# Patient Record
Sex: Female | Born: 1970 | Race: White | Hispanic: No | Smoking: Former smoker
Health system: Southern US, Community
[De-identification: ages and names within clinical notes are randomized; demographics above are authoritative.]

## PROBLEM LIST (undated history)

## (undated) DIAGNOSIS — Z87442 Personal history of urinary calculi: Secondary | ICD-10-CM

## (undated) DIAGNOSIS — M549 Dorsalgia, unspecified: Secondary | ICD-10-CM

## (undated) DIAGNOSIS — R42 Dizziness and giddiness: Secondary | ICD-10-CM

## (undated) DIAGNOSIS — J189 Pneumonia, unspecified organism: Secondary | ICD-10-CM

## (undated) DIAGNOSIS — K219 Gastro-esophageal reflux disease without esophagitis: Secondary | ICD-10-CM

## (undated) DIAGNOSIS — R519 Headache, unspecified: Secondary | ICD-10-CM

## (undated) DIAGNOSIS — I509 Heart failure, unspecified: Secondary | ICD-10-CM

## (undated) DIAGNOSIS — F419 Anxiety disorder, unspecified: Secondary | ICD-10-CM

## (undated) DIAGNOSIS — R0600 Dyspnea, unspecified: Secondary | ICD-10-CM

## (undated) DIAGNOSIS — F32A Depression, unspecified: Secondary | ICD-10-CM

## (undated) DIAGNOSIS — Z9889 Other specified postprocedural states: Secondary | ICD-10-CM

## (undated) DIAGNOSIS — R0902 Hypoxemia: Secondary | ICD-10-CM

## (undated) DIAGNOSIS — E039 Hypothyroidism, unspecified: Secondary | ICD-10-CM

## (undated) DIAGNOSIS — K8020 Calculus of gallbladder without cholecystitis without obstruction: Secondary | ICD-10-CM

## (undated) DIAGNOSIS — E079 Disorder of thyroid, unspecified: Secondary | ICD-10-CM

## (undated) DIAGNOSIS — J449 Chronic obstructive pulmonary disease, unspecified: Secondary | ICD-10-CM

## (undated) DIAGNOSIS — R112 Nausea with vomiting, unspecified: Secondary | ICD-10-CM

## (undated) DIAGNOSIS — N261 Atrophy of kidney (terminal): Secondary | ICD-10-CM

## (undated) DIAGNOSIS — D649 Anemia, unspecified: Secondary | ICD-10-CM

## (undated) DIAGNOSIS — R51 Headache: Secondary | ICD-10-CM

## (undated) DIAGNOSIS — I10 Essential (primary) hypertension: Secondary | ICD-10-CM

## (undated) DIAGNOSIS — I351 Nonrheumatic aortic (valve) insufficiency: Secondary | ICD-10-CM

## (undated) DIAGNOSIS — F329 Major depressive disorder, single episode, unspecified: Secondary | ICD-10-CM

## (undated) HISTORY — DX: Anxiety disorder, unspecified: F41.9

## (undated) HISTORY — DX: Calculus of gallbladder without cholecystitis without obstruction: K80.20

## (undated) HISTORY — DX: Major depressive disorder, single episode, unspecified: F32.9

## (undated) HISTORY — PX: CHOLECYSTECTOMY: SHX55

## (undated) HISTORY — DX: Disorder of thyroid, unspecified: E07.9

## (undated) HISTORY — PX: NO PAST SURGERIES: SHX2092

## (undated) HISTORY — DX: Atrophy of kidney (terminal): N26.1

## (undated) HISTORY — DX: Nonrheumatic aortic (valve) insufficiency: I35.1

## (undated) HISTORY — DX: Depression, unspecified: F32.A

---
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
## 2005-08-09 ENCOUNTER — Emergency Department: Payer: Self-pay | Admitting: Emergency Medicine

## 2006-03-30 ENCOUNTER — Emergency Department: Payer: Self-pay | Admitting: Emergency Medicine

## 2008-01-12 ENCOUNTER — Emergency Department: Payer: Self-pay | Admitting: Emergency Medicine

## 2008-01-17 ENCOUNTER — Emergency Department: Payer: Self-pay | Admitting: Emergency Medicine

## 2008-06-06 ENCOUNTER — Observation Stay: Payer: Self-pay | Admitting: Obstetrics and Gynecology

## 2008-06-18 ENCOUNTER — Observation Stay: Payer: Self-pay | Admitting: Obstetrics and Gynecology

## 2008-06-27 ENCOUNTER — Observation Stay: Payer: Self-pay | Admitting: Obstetrics and Gynecology

## 2008-06-29 ENCOUNTER — Observation Stay: Payer: Self-pay | Admitting: Obstetrics and Gynecology

## 2008-07-03 ENCOUNTER — Inpatient Hospital Stay: Payer: Self-pay | Admitting: Obstetrics and Gynecology

## 2009-03-21 ENCOUNTER — Emergency Department: Payer: Self-pay | Admitting: Internal Medicine

## 2009-07-14 ENCOUNTER — Emergency Department: Payer: Self-pay | Admitting: Internal Medicine

## 2012-07-21 ENCOUNTER — Emergency Department: Payer: Self-pay | Admitting: Emergency Medicine

## 2012-07-21 LAB — URINALYSIS, COMPLETE
Bacteria: NONE SEEN
Bilirubin,UR: NEGATIVE
Glucose,UR: NEGATIVE mg/dL
Ketone: NEGATIVE
Nitrite: NEGATIVE
Ph: 6
Protein: NEGATIVE
RBC,UR: 3 /HPF
Specific Gravity: 1.014
Squamous Epithelial: 1
WBC UR: 5 /HPF

## 2012-07-21 LAB — BASIC METABOLIC PANEL
Anion Gap: 8 (ref 7–16)
BUN: 8 mg/dL (ref 7–18)
Calcium, Total: 8.8 mg/dL (ref 8.5–10.1)
Chloride: 102 mmol/L (ref 98–107)
EGFR (Non-African Amer.): >60
Glucose: 89 mg/dL (ref 65–99)
Osmolality: 270 (ref 275–301)
Potassium: 3.5 mmol/L (ref 3.5–5.1)
Sodium: 136 mmol/L (ref 136–145)

## 2012-07-21 LAB — PREGNANCY, URINE: Pregnancy Test, Urine: NEGATIVE m[IU]/mL

## 2012-07-21 LAB — CBC
HCT: 37.9 %
HGB: 12.3 g/dL
MCH: 28 pg
MCHC: 32.3 g/dL
MCV: 87 fL
Platelet: 274 x10 3/mm 3
RBC: 4.38 X10 6/mm 3
RDW: 15.3 % — ABNORMAL HIGH
WBC: 5.2 x10 3/mm 3

## 2012-07-21 LAB — RAPID INFLUENZA A&B ANTIGENS

## 2015-05-11 ENCOUNTER — Emergency Department: Payer: Medicaid Other

## 2015-05-11 ENCOUNTER — Encounter: Payer: Self-pay | Admitting: *Deleted

## 2015-05-11 ENCOUNTER — Emergency Department
Admission: EM | Admit: 2015-05-11 | Discharge: 2015-05-11 | Disposition: A | Discharge status: Home / Self Care | Payer: Medicaid Other | Attending: Emergency Medicine | Admitting: Emergency Medicine

## 2015-05-11 DIAGNOSIS — R1030 Lower abdominal pain, unspecified: Secondary | ICD-10-CM | POA: Insufficient documentation

## 2015-05-11 DIAGNOSIS — Z3202 Encounter for pregnancy test, result negative: Secondary | ICD-10-CM | POA: Insufficient documentation

## 2015-05-11 DIAGNOSIS — Z720 Tobacco use: Secondary | ICD-10-CM | POA: Insufficient documentation

## 2015-05-11 DIAGNOSIS — R109 Unspecified abdominal pain: Secondary | ICD-10-CM

## 2015-05-11 LAB — COMPREHENSIVE METABOLIC PANEL
ALK PHOS: 96 U/L (ref 38–126)
ALT: 58 U/L — AB (ref 14–54)
AST: 38 U/L (ref 15–41)
Albumin: 3.5 g/dL (ref 3.5–5.0)
Anion gap: 8 (ref 5–15)
BUN: 16 mg/dL (ref 6–20)
CALCIUM: 8.7 mg/dL — AB (ref 8.9–10.3)
CO2: 23 mmol/L (ref 22–32)
CREATININE: 1.01 mg/dL — AB (ref 0.44–1.00)
Chloride: 105 mmol/L (ref 101–111)
Glucose, Bld: 110 mg/dL — ABNORMAL HIGH (ref 65–99)
Potassium: 3.7 mmol/L (ref 3.5–5.1)
Sodium: 136 mmol/L (ref 135–145)
TOTAL PROTEIN: 6.6 g/dL (ref 6.5–8.1)
Total Bilirubin: 0.9 mg/dL (ref 0.3–1.2)

## 2015-05-11 LAB — CBC WITH DIFFERENTIAL/PLATELET
BASOS ABS: 0.1 10*3/uL (ref 0–0.1)
BASOS PCT: 1 %
EOS ABS: 0.1 10*3/uL (ref 0–0.7)
Eosinophils Relative: 1 %
HCT: 35 % (ref 35.0–47.0)
HEMOGLOBIN: 11 g/dL — AB (ref 12.0–16.0)
Lymphocytes Relative: 16 %
Lymphs Abs: 1.7 10*3/uL (ref 1.0–3.6)
MCH: 26.5 pg (ref 26.0–34.0)
MCHC: 31.4 g/dL — AB (ref 32.0–36.0)
MCV: 84.3 fL (ref 80.0–100.0)
MONOS PCT: 7 %
Monocytes Absolute: 0.7 10*3/uL (ref 0.2–0.9)
NEUTROS PCT: 75 %
Neutro Abs: 8.2 10*3/uL — ABNORMAL HIGH (ref 1.4–6.5)
Platelets: 260 10*3/uL (ref 150–440)
RBC: 4.15 MIL/uL (ref 3.80–5.20)
RDW: 19.2 % — ABNORMAL HIGH (ref 11.5–14.5)
WBC: 10.8 10*3/uL (ref 3.6–11.0)

## 2015-05-11 LAB — URINALYSIS COMPLETE WITH MICROSCOPIC (ARMC ONLY)
Bilirubin Urine: NEGATIVE
Glucose, UA: NEGATIVE mg/dL
Hgb urine dipstick: NEGATIVE
Ketones, ur: NEGATIVE mg/dL
NITRITE: NEGATIVE
PROTEIN: 100 mg/dL — AB
SPECIFIC GRAVITY, URINE: 1.025 (ref 1.005–1.030)
pH: 5 (ref 5.0–8.0)

## 2015-05-11 LAB — POCT PREGNANCY, URINE: Preg Test, Ur: NEGATIVE

## 2015-05-11 LAB — HCG, QUANTITATIVE, PREGNANCY: hCG, Beta Chain, Quant, S: <1 m[IU]/mL (ref <5)

## 2015-05-11 NOTE — ED Notes (Signed)
Pt brought in via triage; pt states she feels like she has been having contractions since last night - no evidence though that pt is pregnant at this time.  Pt states that her last pregnancy, she was unaware that she was pregnant until about 7 months; prior to that, pt had 21 negative pregnancy tests. Pt A/O x4 with pain 6/10 in abdominal/pelvic area.  POC urine preg negative per triage RN.

## 2015-05-11 NOTE — Discharge Instructions (Signed)
As we have discussed your workup does not appear to show any acute abnormalities besides a moderate amount of free fluid in her pelvis on ultrasound. As we discussed please follow-up with OB/GYN as soon as possible regarding her abdominal cramping/contractions. Return to the emergency department for any increased abdominal pain, weakness, fever, or any other symptom personally concerning to yourself.   Abdominal Pain, Adult Many things can cause abdominal pain. Usually, abdominal pain is not caused by a disease and will improve without treatment. It can often be observed and treated at home. Your health care provider will do a physical exam and possibly order blood tests and X-rays to help determine the seriousness of your pain. However, in many cases, more time must pass before a clear cause of the pain can be found. Before that point, your health care provider may not know if you need more testing or further treatment. HOME CARE INSTRUCTIONS Monitor your abdominal pain for any changes. The following actions may help to alleviate any discomfort you are experiencing:  Only take over-the-counter or prescription medicines as directed by your health care provider.  Do not take laxatives unless directed to do so by your health care provider.  Try a clear liquid diet (broth, tea, or water) as directed by your health care provider. Slowly move to a bland diet as tolerated. SEEK MEDICAL CARE IF:  You have unexplained abdominal pain.  You have abdominal pain associated with nausea or diarrhea.  You have pain when you urinate or have a bowel movement.  You experience abdominal pain that wakes you in the night.  You have abdominal pain that is worsened or improved by eating food.  You have abdominal pain that is worsened with eating fatty foods.  You have a fever. SEEK IMMEDIATE MEDICAL CARE IF:  Your pain does not go away within 2 hours.  You keep throwing up (vomiting).  Your pain is felt  only in portions of the abdomen, such as the right side or the left lower portion of the abdomen.  You pass bloody or black tarry stools. MAKE SURE YOU:  Understand these instructions.  Will watch your condition.  Will get help right away if you are not doing well or get worse.   This information is not intended to replace advice given to you by your health care provider. Make sure you discuss any questions you have with your health care provider.   Document Released: 04/04/2005 Document Revised: 03/16/2015 Document Reviewed: 03/04/2013 Elsevier Interactive Patient Education [employer].

## 2015-05-11 NOTE — ED Provider Notes (Signed)
[facility] Emergency Department Provider Note  Time seen: 9:44 AM  I have reviewed the triage vital signs and the nursing notes.   HISTORY  Chief Complaint Possible Pregnancy    HPI Grace Fox is a 44 y.o. female with no past medical history who presents the emergency department lower cramping/contractions. According to the patient she has had 6 children, and reports negative pregnancy tests with her children. She states she is having lower abdominal cramping which started at 10 PM last night which she states feels a lot like contractions. Patient states she took a home pregnant test and it was negative, but states with her last child and her pregnancy test remained negative throughout her pregnancy. Denies vaginal bleeding or discharge. Her last period was approximately 2-3 weeks ago, but she states appeared to very irregular and she had irregular bleeding throughout her last pregnancy as well. Denies any nausea, vomiting, diarrhea, dysuria, vaginal bleeding or discharge, or fever. Denies abdominal pain besides lower abdominal cramping/contractions. No modifying factors identified.    History reviewed. No pertinent past medical history.  There are no active problems to display for this patient.   History reviewed. No pertinent past surgical history.  No current outpatient prescriptions on file.  Allergies Review of patient's allergies indicates no known allergies.  No family history on file.  Social History Social History  Substance Use Topics  . Smoking status: Current Every Day Smoker -- 1.00 packs/day    Types: Cigarettes  . Smokeless tobacco: None  . Alcohol Use: No    Review of Systems Constitutional: Negative for fever. Cardiovascular: Negative for chest pain. Respiratory: Negative for shortness of breath. Gastrointestinal: Lower abdominal cramping/contractions Genitourinary: Negative for dysuria. Neurological: Negative for  headaches, focal weakness or numbness. 10-point ROS otherwise negative.  ____________________________________________   PHYSICAL EXAM:  VITAL SIGNS: ED Triage Vitals  Enc Vitals Group     BP 05/11/15 0830 158/98 mmHg     Pulse Rate 05/11/15 0830 110     Resp 05/11/15 0830 18     Temp 05/11/15 0830 97.4 F (36.3 C)     Temp Source 05/11/15 0830 Oral     SpO2 05/11/15 0830 100 %     Weight 05/11/15 0830 234 lb 3 oz (106.227 kg)     Height 05/11/15 0830 5\' 6"  (1.676 m)     Head Cir --      Peak Flow --      Pain Score 05/11/15 0844 6     Pain Loc --      Pain Edu? --      Excl. in [place]? --    Constitutional: Alert and oriented. Well appearing and in no distress. Eyes: Normal exam ENT   Head: Normocephalic and atraumatic.   Mouth/Throat: Mucous membranes are moist. Cardiovascular: Normal rate, regular rhythm. No murmur Respiratory: Normal respiratory effort without tachypnea nor retractions. Breath sounds are clear and equal bilaterally. No wheezes/rales/rhonchi. Gastrointestinal: Soft and nontender. No distention.  Obese Musculoskeletal: Nontender with normal range of motion in all extremities. Neurologic:  Normal speech and language. No gross focal neurologic deficits Skin:  Skin is warm, dry and intact.  Psychiatric: Mood and affect are normal. Speech and behavior are normal.   ____________________________________________     RADIOLOGY  Moderate free fluid in the pelvis, otherwise within normal limits  ____________________________________________    INITIAL IMPRESSION / ASSESSMENT AND PLAN / ED COURSE  Pertinent labs & imaging results that were available during my care  of the patient were reviewed by me and considered in my medical decision making (see chart for details).  Patient's main concern is the possibility of pregnancy. States lower abdominal cramping/contractions since 10 PM last night. States the cramping/contractions appear to be coming and  normal intervals. Urine printed test is negative in the emergency department. We will check labs, and a pelvic ultrasound to further evaluate. Bedside ultrasound shows no identifiable fetus. Patient has a nontender exam currently.  Moderate free fluid in the pelvis on ultrasound, otherwise within normal limits. Patient has a nontender exam. States her discomfort is improved. I discussed with the patient and need for her to follow-up with an OB/GYN symptoms possible given her abdominal cramping and moderate amount of free fluid on ultrasound. Patient is agreeable to plan. We will discharge home at this time. ____________________________________________   FINAL CLINICAL IMPRESSION(S) / ED DIAGNOSES  Abdominal cramping/contractions   Harvest Dark, MD 05/11/15 1223

## 2015-05-11 NOTE — ED Notes (Addendum)
Pt states she is having contractions, last period last month? Pt has had 6 children, pt reports negative pregnancy test, pt reports she had negative pregnancy tests with last child, pt complains of contractions, pt denies vaginal bleeding, RN unable to hear fetal heart tones with doppler, pt denies using birth control

## 2015-05-23 ENCOUNTER — Encounter: Payer: Self-pay | Admitting: Emergency Medicine

## 2015-05-23 ENCOUNTER — Emergency Department
Admission: EM | Admit: 2015-05-23 | Discharge: 2015-05-23 | Disposition: A | Discharge status: Home / Self Care | Payer: Medicaid Other | Attending: Emergency Medicine | Admitting: Emergency Medicine

## 2015-05-23 DIAGNOSIS — F17210 Nicotine dependence, cigarettes, uncomplicated: Secondary | ICD-10-CM | POA: Diagnosis not present

## 2015-05-23 DIAGNOSIS — Y9289 Other specified places as the place of occurrence of the external cause: Secondary | ICD-10-CM | POA: Diagnosis not present

## 2015-05-23 DIAGNOSIS — Y9389 Activity, other specified: Secondary | ICD-10-CM | POA: Diagnosis not present

## 2015-05-23 DIAGNOSIS — R000 Tachycardia, unspecified: Secondary | ICD-10-CM | POA: Diagnosis not present

## 2015-05-23 DIAGNOSIS — T7840XA Allergy, unspecified, initial encounter: Secondary | ICD-10-CM | POA: Diagnosis present

## 2015-05-23 DIAGNOSIS — F419 Anxiety disorder, unspecified: Secondary | ICD-10-CM | POA: Diagnosis not present

## 2015-05-23 DIAGNOSIS — L299 Pruritus, unspecified: Secondary | ICD-10-CM | POA: Diagnosis not present

## 2015-05-23 DIAGNOSIS — R220 Localized swelling, mass and lump, head: Secondary | ICD-10-CM | POA: Diagnosis not present

## 2015-05-23 DIAGNOSIS — Y998 Other external cause status: Secondary | ICD-10-CM | POA: Insufficient documentation

## 2015-05-23 DIAGNOSIS — X58XXXA Exposure to other specified factors, initial encounter: Secondary | ICD-10-CM | POA: Diagnosis not present

## 2015-05-23 MED ORDER — METHYLPREDNISOLONE SODIUM SUCC 125 MG IJ SOLR
125.0000 mg | Freq: Once | INTRAMUSCULAR | Status: AC
Start: 2015-05-23 — End: 2015-05-23
  Administered 2015-05-23: 125 mg via INTRAVENOUS
  Filled 2015-05-23: qty 2

## 2015-05-23 MED ORDER — PREDNISONE 50 MG PO TABS
50.0000 mg | ORAL_TABLET | Freq: Every day | ORAL | Status: DC
Start: 2015-05-23 — End: 2015-05-25

## 2015-05-23 MED ORDER — DIPHENHYDRAMINE HCL 50 MG/ML IJ SOLN
25.0000 mg | Freq: Once | INTRAMUSCULAR | Status: AC
  Administered 2015-05-23: 25 mg via INTRAVENOUS
  Filled 2015-05-23: qty 1

## 2015-05-23 MED ORDER — FAMOTIDINE IN NACL 20-0.9 MG/50ML-% IV SOLN
20.0000 mg | Freq: Once | INTRAVENOUS | Status: AC
  Administered 2015-05-23: 20 mg via INTRAVENOUS
  Filled 2015-05-23: qty 50

## 2015-05-23 NOTE — Discharge Instructions (Signed)

## 2015-05-23 NOTE — ED Provider Notes (Signed)
[facility] Emergency Department Provider Note  ____________________________________________  Time seen: On arrival  I have reviewed the triage vital signs and the nursing notes.   HISTORY  Chief Complaint Oral Swelling and Allergic Reaction    HPI PIERSON HUNNICUTT is a 44 y.o. female who presents with complaints of lip swelling and itching which started approximately 7 hours prior to arrival. She has never had an allergic reaction before. She denies difficulty breathing or throat swelling. Primarily she is worried about swelling of her lips and itching although she has no rash. She is unclear what may have caused the symptoms. No new medications     History reviewed. No pertinent past medical history.  There are no active problems to display for this patient.   History reviewed. No pertinent past surgical history.  No current outpatient prescriptions on file.  Allergies Review of patient's allergies indicates no known allergies.  No family history on file.  Social History Social History  Substance Use Topics  . Smoking status: Current Every Day Smoker -- 1.00 packs/day    Types: Cigarettes  . Smokeless tobacco: None  . Alcohol Use: No    Review of Systems  Constitutional: Negative for fever. Eyes: Negative for visual changes. ENT: Negative for sore throat. Positive for lip swelling Cardiovascular: Negative for chest pain. Respiratory: Negative for shortness of breath. Gastrointestinal: Negative for abdominal pain, vomiting and diarrhea. Genitourinary: Negative for dysuria. Musculoskeletal: Negative for back pain. Skin: Negative for rash. Positive for pruritus Neurological: Negative for headaches or focal weakness Psychiatric: Mild anxiety    ____________________________________________   PHYSICAL EXAM:  VITAL SIGNS: ED Triage Vitals  Enc Vitals Group     BP 05/23/15 0934 151/94 mmHg     Pulse Rate 05/23/15 0934 110   Resp 05/23/15 0934 20     Temp 05/23/15 0934 97.8 F (36.6 C)     Temp Source 05/23/15 0934 Oral     SpO2 05/23/15 0934 97 %     Weight 05/23/15 0934 241 lb 8 oz (109.544 kg)     Height 05/23/15 0934 5\' 6"  (1.676 m)     Head Cir --      Peak Flow --      Pain Score 05/23/15 0950 5     Pain Loc --      Pain Edu? --      Excl. in [place]? --      Constitutional: Alert and oriented. Well appearing and in no distress. Eyes: Conjunctivae are normal.  ENT   Head: Normocephalic and atraumatic.   Mouth/Throat: Mucous membranes are moist. Lower lip is swollen slightly, pharynx is normal, uvula is normal Cardiovascular: Tachycardia, regular rhythm. Normal and symmetric distal pulses are present in all extremities. No murmurs, rubs, or gallops. Respiratory: Normal respiratory effort without tachypnea nor retractions. Breath sounds are clear and equal bilaterally. No wheezing or stridor Gastrointestinal: Soft and non-tender in all quadrants. No distention. There is no CVA tenderness. Genitourinary: deferred Musculoskeletal: Nontender with normal range of motion in all extremities. No lower extremity tenderness nor edema. Neurologic:  Normal speech and language. No gross focal neurologic deficits are appreciated. Skin:  Skin is warm, dry and intact. No rash noted. No hives Psychiatric: Mood and affect are normal. Patient exhibits appropriate insight and judgment.  ____________________________________________    LABS (pertinent positives/negatives)  Labs Reviewed - No data to display  ____________________________________________   EKG  None  ____________________________________________    RADIOLOGY I have personally reviewed any  xrays that were ordered on this patient: None  ____________________________________________   PROCEDURES  Procedure(s) performed: none  Critical Care performed:none  ____________________________________________   INITIAL IMPRESSION /  ASSESSMENT AND PLAN / ED COURSE  Pertinent labs & imaging results that were available during my care of the patient were reviewed by me and considered in my medical decision making (see chart for details).  Patient's presentation consistent with allergic reaction. We will give slight Medrol IV, Pepcid IV, Benadryl IV, and monitor the patient. No airway involvement at this time.  After observation in the emergency department patient's symptoms markedly improved. Her lip swelling is always completely resolved. I will discharge her with by mouth prednisone for the next 5 days. She knows to return immediately if any worsening of her symptoms  ____________________________________________   FINAL CLINICAL IMPRESSION(S) / ED DIAGNOSES  Final diagnoses:  Allergic reaction, initial encounter     Lavonia Drafts, MD 05/23/15 1350

## 2015-05-23 NOTE — ED Notes (Signed)
Patient here complaining of lip swelling starting at 3 AM "it felt like a cold sore to begin with" has been getting progressively worse since then involving both lips.  Complaining of itching all over, "my hands and fingertips are numb".  Denies exposure to known allergens.   Lips possibly swollen, speech is clear. RR 20

## 2015-05-25 ENCOUNTER — Emergency Department
Admission: EM | Admit: 2015-05-25 | Discharge: 2015-05-25 | Disposition: A | Discharge status: Home / Self Care | Payer: Medicaid Other | Attending: Emergency Medicine | Admitting: Emergency Medicine

## 2015-05-25 DIAGNOSIS — X58XXXA Exposure to other specified factors, initial encounter: Secondary | ICD-10-CM | POA: Diagnosis not present

## 2015-05-25 DIAGNOSIS — F17210 Nicotine dependence, cigarettes, uncomplicated: Secondary | ICD-10-CM | POA: Insufficient documentation

## 2015-05-25 DIAGNOSIS — Y998 Other external cause status: Secondary | ICD-10-CM | POA: Insufficient documentation

## 2015-05-25 DIAGNOSIS — T782XXA Anaphylactic shock, unspecified, initial encounter: Secondary | ICD-10-CM | POA: Insufficient documentation

## 2015-05-25 DIAGNOSIS — Z7952 Long term (current) use of systemic steroids: Secondary | ICD-10-CM | POA: Diagnosis not present

## 2015-05-25 DIAGNOSIS — R1110 Vomiting, unspecified: Secondary | ICD-10-CM | POA: Insufficient documentation

## 2015-05-25 DIAGNOSIS — Z792 Long term (current) use of antibiotics: Secondary | ICD-10-CM | POA: Insufficient documentation

## 2015-05-25 DIAGNOSIS — Y9389 Activity, other specified: Secondary | ICD-10-CM | POA: Insufficient documentation

## 2015-05-25 DIAGNOSIS — H05013 Cellulitis of bilateral orbits: Secondary | ICD-10-CM | POA: Insufficient documentation

## 2015-05-25 DIAGNOSIS — Z79899 Other long term (current) drug therapy: Secondary | ICD-10-CM | POA: Insufficient documentation

## 2015-05-25 DIAGNOSIS — T7840XA Allergy, unspecified, initial encounter: Secondary | ICD-10-CM | POA: Diagnosis present

## 2015-05-25 DIAGNOSIS — Y9289 Other specified places as the place of occurrence of the external cause: Secondary | ICD-10-CM | POA: Diagnosis not present

## 2015-05-25 DIAGNOSIS — L03213 Periorbital cellulitis: Secondary | ICD-10-CM

## 2015-05-25 LAB — BASIC METABOLIC PANEL
Anion gap: 8 (ref 5–15)
BUN: 26 mg/dL — AB (ref 6–20)
CALCIUM: 8.3 mg/dL — AB (ref 8.9–10.3)
CO2: 24 mmol/L (ref 22–32)
CREATININE: 1.35 mg/dL — AB (ref 0.44–1.00)
Chloride: 107 mmol/L (ref 101–111)
GFR calc non Af Amer: 48 mL/min — ABNORMAL LOW (ref >60)
GFR, EST AFRICAN AMERICAN: 55 mL/min — AB (ref >60)
Glucose, Bld: 118 mg/dL — ABNORMAL HIGH (ref 65–99)
Potassium: 3.6 mmol/L (ref 3.5–5.1)
SODIUM: 139 mmol/L (ref 135–145)

## 2015-05-25 LAB — CBC WITH DIFFERENTIAL/PLATELET
BASOS PCT: 1 %
Basophils Absolute: 0.1 10*3/uL (ref 0–0.1)
EOS ABS: 0.1 10*3/uL (ref 0–0.7)
EOS PCT: 1 %
HCT: 36 % (ref 35.0–47.0)
Hemoglobin: 11.3 g/dL — ABNORMAL LOW (ref 12.0–16.0)
Lymphocytes Relative: 17 %
Lymphs Abs: 2.2 10*3/uL (ref 1.0–3.6)
MCH: 25.9 pg — AB (ref 26.0–34.0)
MCHC: 31.3 g/dL — AB (ref 32.0–36.0)
MCV: 83 fL (ref 80.0–100.0)
Monocytes Absolute: 0.7 10*3/uL (ref 0.2–0.9)
Monocytes Relative: 5 %
NEUTROS PCT: 76 %
Neutro Abs: 9.8 10*3/uL — ABNORMAL HIGH (ref 1.4–6.5)
PLATELETS: 347 10*3/uL (ref 150–440)
RBC: 4.34 MIL/uL (ref 3.80–5.20)
RDW: 18.4 % — AB (ref 11.5–14.5)
WBC: 12.9 10*3/uL — AB (ref 3.6–11.0)

## 2015-05-25 MED ORDER — ONDANSETRON HCL 4 MG/2ML IJ SOLN
INTRAMUSCULAR | Status: AC
  Administered 2015-05-25: 4 mg via INTRAVENOUS
  Filled 2015-05-25: qty 2

## 2015-05-25 MED ORDER — SULFAMETHOXAZOLE-TRIMETHOPRIM 800-160 MG PO TABS: 1.0000 | ORAL_TABLET | Freq: Two times a day (BID) | ORAL | Status: DC

## 2015-05-25 MED ORDER — DIPHENHYDRAMINE HCL 25 MG PO CAPS: 50.0000 mg | ORAL_CAPSULE | Freq: Four times a day (QID) | ORAL | Status: DC | PRN

## 2015-05-25 MED ORDER — PREDNISONE 20 MG PO TABS
40.0000 mg | ORAL_TABLET | Freq: Once | ORAL | Status: AC
  Administered 2015-05-25: 40 mg via ORAL
  Filled 2015-05-25: qty 2

## 2015-05-25 MED ORDER — ONDANSETRON 8 MG PO TBDP
8.0000 mg | ORAL_TABLET | Freq: Once | ORAL | Status: AC
  Administered 2015-05-25: 8 mg via ORAL
  Filled 2015-05-25: qty 1

## 2015-05-25 MED ORDER — AMOXICILLIN 875 MG PO TABS: 875.0000 mg | ORAL_TABLET | Freq: Two times a day (BID) | ORAL | Status: DC

## 2015-05-25 MED ORDER — DIPHENHYDRAMINE HCL 50 MG/ML IJ SOLN
50.0000 mg | Freq: Once | INTRAMUSCULAR | Status: AC
  Administered 2015-05-25: 50 mg via INTRAVENOUS
  Filled 2015-05-25: qty 1

## 2015-05-25 MED ORDER — FAMOTIDINE IN NACL 20-0.9 MG/50ML-% IV SOLN
20.0000 mg | Freq: Once | INTRAVENOUS | Status: AC
  Administered 2015-05-25: 20 mg via INTRAVENOUS
  Filled 2015-05-25: qty 50

## 2015-05-25 MED ORDER — IPRATROPIUM-ALBUTEROL 0.5-2.5 (3) MG/3ML IN SOLN
3.0000 mL | Freq: Once | RESPIRATORY_TRACT | Status: AC
  Administered 2015-05-25: 3 mL via RESPIRATORY_TRACT
  Filled 2015-05-25: qty 3

## 2015-05-25 MED ORDER — ONDANSETRON HCL 4 MG/2ML IJ SOLN
4.0000 mg | Freq: Once | INTRAMUSCULAR | Status: AC
  Administered 2015-05-25: 4 mg via INTRAVENOUS

## 2015-05-25 MED ORDER — EPINEPHRINE 0.3 MG/0.3ML IJ SOAJ
0.3000 mg | Freq: Once | INTRAMUSCULAR | Status: AC
  Administered 2015-05-25: 0.3 mg via INTRAMUSCULAR
  Filled 2015-05-25: qty 0.3

## 2015-05-25 MED ORDER — METHYLPREDNISOLONE SODIUM SUCC 125 MG IJ SOLR
125.0000 mg | Freq: Once | INTRAMUSCULAR | Status: AC
  Administered 2015-05-25: 125 mg via INTRAVENOUS
  Filled 2015-05-25: qty 2

## 2015-05-25 MED ORDER — PREDNISONE 20 MG PO TABS: 40.0000 mg | ORAL_TABLET | Freq: Every day | ORAL | Status: DC

## 2015-05-25 MED ORDER — SODIUM CHLORIDE 0.9 % IV BOLUS (SEPSIS)
1000.0000 mL | Freq: Once | INTRAVENOUS | Status: AC
  Administered 2015-05-25: 1000 mL via INTRAVENOUS

## 2015-05-25 MED ORDER — EPINEPHRINE 0.3 MG/0.3ML IJ SOAJ: 0.3000 mg | Freq: Once | INTRAMUSCULAR | Status: DC

## 2015-05-25 NOTE — Discharge Instructions (Signed)

## 2015-05-25 NOTE — ED Notes (Signed)
Pt with allergic reaction to unknown substance, was seen for same on Monday and is not any better. Redness and swelling noted to face, eyes and lips.

## 2015-05-25 NOTE — ED Provider Notes (Signed)
[facility] Emergency Department Provider Note  ____________________________________________  Time seen: 8:10 AM  I have reviewed the triage vital signs and the nursing notes.   HISTORY  Chief Complaint Allergic Reaction    HPI Grace Fox is a 44 y.o. female who woke up this morning with swelling of the face and eyes. She feels little bit short of breath. She also has some abdominal cramping nausea and had one episode of vomiting at home. No known exposure to any allergens. No known allergies. She is at her usual state of health when she went to bed last night. She was seen in the emergency department 2 days ago for allergic reaction without any known cause and got better and was sent home. No new fabric or materials in the home, soaps or detergents, no medications.No new pets.     No past medical history on file. Negative  There are no active problems to display for this patient.    No past surgical history on file. Negative  Current Outpatient Rx  Name  Route  Sig  Dispense  Refill  . amoxicillin (AMOXIL) 875 MG tablet   Oral   Take 1 tablet (875 mg total) by mouth 2 (two) times daily.   14 tablet   0   . diphenhydrAMINE (BENADRYL) 25 mg capsule   Oral   Take 2 capsules (50 mg total) by mouth every 6 (six) hours as needed.   60 capsule   0   . EPINEPHrine 0.3 mg/0.3 mL IJ SOAJ injection   Intramuscular   Inject 0.3 mLs (0.3 mg total) into the muscle once. Follow package instructions as needed for severe allergy or anaphylactic reaction.   1 Device   2   . predniSONE (DELTASONE) 20 MG tablet   Oral   Take 2 tablets (40 mg total) by mouth daily.   8 tablet   0   . sulfamethoxazole-trimethoprim (BACTRIM DS) 800-160 MG tablet   Oral   Take 1 tablet by mouth 2 (two) times daily.   14 tablet   0      Allergies Review of patient's allergies indicates no known allergies.   No family history on file.  Social  History Social History  Substance Use Topics  . Smoking status: Current Every Day Smoker -- 1.00 packs/day    Types: Cigarettes  . Smokeless tobacco: Not on file  . Alcohol Use: No    Review of Systems  Constitutional:   No fever or chills. No weight changes Eyes:   No blurry vision or double vision. Swelling of both eyes ENT:   No sore throat. Cardiovascular:   No chest pain. Respiratory:  Mild shortness of breath without cough. Gastrointestinal:   Positive for abdominal pain, with vomiting.  No BRBPR or melena. Genitourinary:   Negative for dysuria, urinary retention, bloody urine, or difficulty urinating. Musculoskeletal:   Negative for back pain. No joint swelling or pain. Skin:   Negative for rash. Neurological:   Negative for headaches, focal weakness or numbness. Psychiatric:  No anxiety or depression.   Endocrine:  No hot/cold intolerance, changes in energy, or sleep difficulty.  10-point ROS otherwise negative.  ____________________________________________   PHYSICAL EXAM:  VITAL SIGNS: ED Triage Vitals  Enc Vitals Group     BP 05/25/15 0803 181/101 mmHg     Pulse Rate 05/25/15 0803 111     Resp 05/25/15 0803 24     Temp 05/25/15 0811 98.1 F (36.7 C)  Temp Source 05/25/15 0811 Oral     SpO2 05/25/15 0803 92 %     Weight --      Height 05/25/15 0803 5\' 6"  (1.676 m)     Head Cir --      Peak Flow --      Pain Score 05/25/15 0803 7     Pain Loc --      Pain Edu? --      Excl. in [place]? --      Constitutional:   Alert and oriented. Well appearing and in no distress. Eyes:   No scleral icterus. No conjunctival pallor. PERRL. painless EOMI. Injected conjunctiva bilaterally, periorbital edema and erythema in the upper and lower eyelids. ENT   Head:   Normocephalic and atraumatic. Urticarial rash on both cheeks   Nose:   No congestion/rhinnorhea. No septal hematoma   Mouth/Throat:   MMM, no pharyngeal erythema. No peritonsillar mass. No uvula  shift.   Neck:   No stridor. No SubQ emphysema. No meningismus. Hematological/Lymphatic/Immunilogical:   No cervical lymphadenopathy. Cardiovascular:   RRR. Normal and symmetric distal pulses are present in all extremities. No murmurs, rubs, or gallops. Respiratory:   Normal respiratory effort without tachypnea nor retractions. Breath sounds are clear and equal bilaterally. No wheezes/rales/rhonchi. Gastrointestinal:   Soft and nontender. No distention. There is no CVA tenderness.  No rebound, rigidity, or guarding. Genitourinary:   deferred Musculoskeletal:   Nontender with normal range of motion in all extremities. No joint effusions.  No lower extremity tenderness.  No edema. Neurologic:   Normal speech and language.  CN 2-10 normal. Motor grossly intact. No pronator drift.  Normal gait. No gross focal neurologic deficits are appreciated.  Skin:    Skin is warm, dry and intact. Urticarial rash bilaterally on the face neck and shoulders, extending down the upper extremities. No rash on the chest back or lower extremities.  No petechiae, purpura, or bullae. Psychiatric:   Mood and affect are normal. Speech and behavior are normal. Patient exhibits appropriate insight and judgment.  ____________________________________________    LABS (pertinent positives/negatives) (all labs ordered are listed, but only abnormal results are displayed) Labs Reviewed  BASIC METABOLIC PANEL - Abnormal; Notable for the following:    Glucose, Bld 118 (*)    BUN 26 (*)    Creatinine, Ser 1.35 (*)    Calcium 8.3 (*)    GFR calc non Af Amer 48 (*)    GFR calc Af Amer 55 (*)    All other components within normal limits  CBC WITH DIFFERENTIAL/PLATELET - Abnormal; Notable for the following:    WBC 12.9 (*)    Hemoglobin 11.3 (*)    MCH 25.9 (*)    MCHC 31.3 (*)    RDW 18.4 (*)    Neutro Abs 9.8 (*)    All other components within normal limits    ____________________________________________   EKG  Interpreted by me Sinus tachycardia rate 108, normal axis intervals QRS and ST segments and T waves.  ____________________________________________    RADIOLOGY    ____________________________________________   PROCEDURES CRITICAL CARE Performed by: Carrie Mew   Total critical care time: 35 minutes  Critical care time was exclusive of separately billable procedures and treating other patients.  Critical care was necessary to treat or prevent imminent or life-threatening deterioration.  Critical care was time spent personally by me on the following activities: development of treatment plan with patient and/or surrogate as well as nursing, discussions with consultants, evaluation  of patient's response to treatment, examination of patient, obtaining history from patient or surrogate, ordering and performing treatments and interventions, ordering and review of laboratory studies, ordering and review of radiographic studies, pulse oximetry and re-evaluation of patient's condition.   ____________________________________________   INITIAL IMPRESSION / ASSESSMENT AND PLAN / ED COURSE  Pertinent labs & imaging results that were available during my care of the patient were reviewed by me and considered in my medical decision making (see chart for details).  Patient presents with symptoms of allergic rectal with multisystem involvement consistent with anaphylaxis. No evidence of shock or hypotension. Patient given Solu-Medrol, Benadryl, famotidine, epinephrine on arrival. Also given a DuoNeb due to her shortness of breath although there is no wheezing.  8:45 AM Vitals remained stable, mildly tachycardic, IV fluids infusing. Patient on 1 L nasal cannula due to occasional oxygen saturation of 89%, but on my evaluation reassessment, patient has oxygen saturation of 95% with good waveform.  12:55 PM Patient continues to feel  better, no worsening or recurrence of symptoms and rash. Persistent periorbital edema and erythema consistent with cellulitis. Low suspicion for orbital cellulitis, no evidence of trauma. No airway involvement or compromise. Her on Bactrim and amoxicillin as well as antihistamine steroids and EpiPen prescription for the allergic reaction. Follow-up with primary care. Oxygen saturation remains 95%, vitals remained stable. Initial tachypnea has resolved, initial tachycardia has resolved. On further history the patient is confident that her reactions are due to either peanut butter or chocolate she has eaten both of these the night before both ED presentations this week. She'll abstain from peanut butter and chocolate and monitor her symptoms.      ____________________________________________   FINAL CLINICAL IMPRESSION(S) / ED DIAGNOSES  Final diagnoses:  Anaphylactic reaction, initial encounter  Periorbital cellulitis, unspecified laterality      Carrie Mew, MD 05/25/15 1258

## 2015-05-25 NOTE — ED Notes (Signed)
Pt's oxygen saturation 89% on room air. Pt placed on 2L via [place].

## 2015-05-26 ENCOUNTER — Encounter: Payer: Self-pay | Admitting: Emergency Medicine

## 2015-05-26 ENCOUNTER — Emergency Department: Payer: Medicaid Other

## 2015-05-26 ENCOUNTER — Observation Stay
Admission: EM | Admit: 2015-05-26 | Discharge: 2015-05-27 | Disposition: A | Discharge status: Home / Self Care | Payer: Medicaid Other | Attending: Internal Medicine | Admitting: Internal Medicine

## 2015-05-26 DIAGNOSIS — L530 Toxic erythema: Principal | ICD-10-CM | POA: Insufficient documentation

## 2015-05-26 DIAGNOSIS — H1133 Conjunctival hemorrhage, bilateral: Secondary | ICD-10-CM | POA: Diagnosis not present

## 2015-05-26 DIAGNOSIS — F17210 Nicotine dependence, cigarettes, uncomplicated: Secondary | ICD-10-CM | POA: Diagnosis not present

## 2015-05-26 DIAGNOSIS — T39315A Adverse effect of propionic acid derivatives, initial encounter: Secondary | ICD-10-CM | POA: Insufficient documentation

## 2015-05-26 DIAGNOSIS — R21 Rash and other nonspecific skin eruption: Secondary | ICD-10-CM | POA: Diagnosis not present

## 2015-05-26 DIAGNOSIS — Z79899 Other long term (current) drug therapy: Secondary | ICD-10-CM | POA: Diagnosis not present

## 2015-05-26 DIAGNOSIS — T7840XD Allergy, unspecified, subsequent encounter: Secondary | ICD-10-CM

## 2015-05-26 DIAGNOSIS — I10 Essential (primary) hypertension: Secondary | ICD-10-CM | POA: Diagnosis not present

## 2015-05-26 DIAGNOSIS — R220 Localized swelling, mass and lump, head: Secondary | ICD-10-CM | POA: Insufficient documentation

## 2015-05-26 DIAGNOSIS — R0602 Shortness of breath: Secondary | ICD-10-CM | POA: Diagnosis not present

## 2015-05-26 DIAGNOSIS — T7840XA Allergy, unspecified, initial encounter: Secondary | ICD-10-CM | POA: Diagnosis present

## 2015-05-26 HISTORY — DX: Headache, unspecified: R51.9

## 2015-05-26 HISTORY — DX: Essential (primary) hypertension: I10

## 2015-05-26 HISTORY — DX: Headache: R51

## 2015-05-26 HISTORY — DX: Pneumonia, unspecified organism: J18.9

## 2015-05-26 LAB — URINALYSIS COMPLETE WITH MICROSCOPIC (ARMC ONLY)
Bilirubin Urine: NEGATIVE
Glucose, UA: NEGATIVE mg/dL
Hgb urine dipstick: NEGATIVE
KETONES UR: NEGATIVE mg/dL
Leukocytes, UA: NEGATIVE
NITRITE: NEGATIVE
PROTEIN: 100 mg/dL — AB
Specific Gravity, Urine: 1.026 (ref 1.005–1.030)
pH: 6 (ref 5.0–8.0)

## 2015-05-26 LAB — COMPREHENSIVE METABOLIC PANEL
ALK PHOS: 75 U/L (ref 38–126)
ALT: 26 U/L (ref 14–54)
AST: 22 U/L (ref 15–41)
Albumin: 3.4 g/dL — ABNORMAL LOW (ref 3.5–5.0)
Anion gap: 9 (ref 5–15)
BUN: 29 mg/dL — ABNORMAL HIGH (ref 6–20)
CALCIUM: 8.7 mg/dL — AB (ref 8.9–10.3)
CO2: 21 mmol/L — AB (ref 22–32)
CREATININE: 1.33 mg/dL — AB (ref 0.44–1.00)
Chloride: 107 mmol/L (ref 101–111)
GFR, EST AFRICAN AMERICAN: 56 mL/min — AB (ref >60)
GFR, EST NON AFRICAN AMERICAN: 49 mL/min — AB (ref >60)
Glucose, Bld: 119 mg/dL — ABNORMAL HIGH (ref 65–99)
Potassium: 4.6 mmol/L (ref 3.5–5.1)
Sodium: 137 mmol/L (ref 135–145)
Total Bilirubin: 0.5 mg/dL (ref 0.3–1.2)
Total Protein: 6.5 g/dL (ref 6.5–8.1)

## 2015-05-26 LAB — CBC WITH DIFFERENTIAL/PLATELET
Basophils Absolute: 0.1 10*3/uL (ref 0–0.1)
Basophils Relative: 1 %
EOS ABS: 0 10*3/uL (ref 0–0.7)
Eosinophils Relative: 0 %
HCT: 35.4 % (ref 35.0–47.0)
HEMOGLOBIN: 11.1 g/dL — AB (ref 12.0–16.0)
LYMPHS ABS: 1.4 10*3/uL (ref 1.0–3.6)
Lymphocytes Relative: 12 %
MCH: 26.1 pg (ref 26.0–34.0)
MCHC: 31.4 g/dL — ABNORMAL LOW (ref 32.0–36.0)
MCV: 83.3 fL (ref 80.0–100.0)
MONO ABS: 0.8 10*3/uL (ref 0.2–0.9)
MONOS PCT: 7 %
NEUTROS PCT: 80 %
Neutro Abs: 9.4 10*3/uL — ABNORMAL HIGH (ref 1.4–6.5)
Platelets: 298 10*3/uL (ref 150–440)
RBC: 4.24 MIL/uL (ref 3.80–5.20)
RDW: 18.7 % — ABNORMAL HIGH (ref 11.5–14.5)
WBC: 11.7 10*3/uL — ABNORMAL HIGH (ref 3.6–11.0)

## 2015-05-26 LAB — TSH: TSH: 1.634 u[IU]/mL (ref 0.350–4.500)

## 2015-05-26 LAB — PREGNANCY, URINE: Preg Test, Ur: NEGATIVE

## 2015-05-26 MED ORDER — METHYLPREDNISOLONE SODIUM SUCC 125 MG IJ SOLR
60.0000 mg | INTRAMUSCULAR | Status: DC
  Administered 2015-05-26: 60 mg via INTRAVENOUS
  Filled 2015-05-26: qty 2

## 2015-05-26 MED ORDER — DIPHENHYDRAMINE HCL 50 MG/ML IJ SOLN
25.0000 mg | INTRAMUSCULAR | Status: DC | PRN
  Administered 2015-05-26 – 2015-05-27 (×2): 25 mg via INTRAVENOUS
  Filled 2015-05-26 (×2): qty 1

## 2015-05-26 MED ORDER — OXYCODONE HCL 5 MG PO TABS
5.0000 mg | ORAL_TABLET | ORAL | Status: DC | PRN
  Administered 2015-05-26: 5 mg via ORAL
  Filled 2015-05-26: qty 1

## 2015-05-26 MED ORDER — FAMOTIDINE IN NACL 20-0.9 MG/50ML-% IV SOLN
20.0000 mg | Freq: Once | INTRAVENOUS | Status: AC
  Administered 2015-05-26: 20 mg via INTRAVENOUS
  Filled 2015-05-26: qty 50

## 2015-05-26 MED ORDER — DIPHENHYDRAMINE HCL 50 MG/ML IJ SOLN
50.0000 mg | Freq: Once | INTRAMUSCULAR | Status: AC
  Administered 2015-05-26: 50 mg via INTRAVENOUS
  Filled 2015-05-26: qty 1

## 2015-05-26 MED ORDER — METHYLPREDNISOLONE SODIUM SUCC 125 MG IJ SOLR
125.0000 mg | Freq: Once | INTRAMUSCULAR | Status: AC
  Administered 2015-05-26: 125 mg via INTRAVENOUS
  Filled 2015-05-26: qty 2

## 2015-05-26 MED ORDER — MORPHINE SULFATE (PF) 2 MG/ML IV SOLN: 2.0000 mg | INTRAVENOUS | Status: DC | PRN

## 2015-05-26 MED ORDER — ACETAMINOPHEN 650 MG RE SUPP: 650.0000 mg | Freq: Four times a day (QID) | RECTAL | Status: DC | PRN

## 2015-05-26 MED ORDER — ONDANSETRON HCL 4 MG PO TABS: 4.0000 mg | ORAL_TABLET | Freq: Four times a day (QID) | ORAL | Status: DC | PRN

## 2015-05-26 MED ORDER — HEPARIN SODIUM (PORCINE) 5000 UNIT/ML IJ SOLN
5000.0000 [IU] | Freq: Three times a day (TID) | INTRAMUSCULAR | Status: DC
  Administered 2015-05-26 – 2015-05-27 (×2): 5000 [IU] via SUBCUTANEOUS
  Filled 2015-05-26 (×2): qty 1

## 2015-05-26 MED ORDER — ACETAMINOPHEN 325 MG PO TABS: 650.0000 mg | ORAL_TABLET | Freq: Four times a day (QID) | ORAL | Status: DC | PRN

## 2015-05-26 MED ORDER — ONDANSETRON HCL 4 MG/2ML IJ SOLN: 4.0000 mg | Freq: Four times a day (QID) | INTRAMUSCULAR | Status: DC | PRN

## 2015-05-26 NOTE — Progress Notes (Signed)
Pt states that her right arm feels like "jello" at times, and that when she scratches the palms of her hands it makes her feel nausea at times. Benadyrl was given to pt to help her itching. VSS. Will continue to monitor pt.   Angus Seller

## 2015-05-26 NOTE — ED Notes (Addendum)
Pt to ED with c/o facial swelling, itching all over and  sob, states she has been seen in ED the last couple of days for allergic reaction, states today she took an ibuprofen and then the sob became worse

## 2015-05-26 NOTE — H&P (Signed)
[facility] [place] NAME: Grace Fox    MR#:  [id number]  DATE OF BIRTH:  11-Apr-1971   DATE OF ADMISSION:  05/26/2015  PRIMARY CARE PHYSICIAN: Lorelee Market, MD   REQUESTING/REFERRING PHYSICIAN: Jimmye Norman  CHIEF COMPLAINT:   Chief Complaint  Patient presents with  . Allergic Reaction    HISTORY OF PRESENT ILLNESS:  Grace Fox  is a 44 y.o. female without significant past medical history who is presenting with allergic reaction. This is her third presentation to the emergency department with a week for similar symptoms. She presents with lip swelling and pruritus as well as swelling and erythema on the eyes and forehead, she also denotes having pruritus of the palmar region of her hands. 2 prior visits to the emergency department she responded to steroids and Benadryl however approximately within 24 hours being home symptoms recurred thus re-presented to Hospital further workup and evaluation. This particular visit she was doing well until she took some ibuprofen notice having some shortness of breath and return of above symptoms thus prompting her to present to the hospital. She's never had issues with ibuprofen or medications prior to this. Of note recently started on Bactrim and amoxicillin-2 days ago (which she has never had adverse reactions). PAST MEDICAL HISTORY:  History reviewed. No pertinent past medical history.  PAST SURGICAL HISTORY:  History reviewed. No pertinent past surgical history.  SOCIAL HISTORY:   Social History  Substance Use Topics  . Smoking status: Current Every Day Smoker -- 1.00 packs/day    Types: Cigarettes  . Smokeless tobacco: Not on file  . Alcohol Use: No    FAMILY HISTORY:   Family History  Problem Relation Age of Onset  . Diabetes Neg Hx     DRUG ALLERGIES:   Allergies  Allergen Reactions  . Ibuprofen Hives    REVIEW OF SYSTEMS:  REVIEW OF SYSTEMS:   CONSTITUTIONAL: Denies fevers, chills, fatigue, weakness.  EYES: Denies blurred vision, double vision, or eye pain.  EARS, NOSE, THROAT: Denies tinnitus, ear pain, hearing loss.  RESPIRATORY: denies cough, positive shortness of breath, denies wheezing  CARDIOVASCULAR: Denies chest pain, palpitations, edema.  GASTROINTESTINAL: Denies nausea, vomiting, diarrhea, abdominal pain.  GENITOURINARY: Denies dysuria, hematuria.  ENDOCRINE: Denies nocturia or thyroid problems. HEMATOLOGIC AND LYMPHATIC: Denies easy bruising or bleeding.  SKIN: Positive rash denies lesions.  MUSCULOSKELETAL: Denies pain in neck, back, shoulder, knees, hips, or further arthritic symptoms.  NEUROLOGIC: Denies paralysis, paresthesias.  PSYCHIATRIC: Denies anxiety or depressive symptoms. Otherwise full review of systems performed by me is negative.   MEDICATIONS AT HOME:   Prior to Admission medications   Medication Sig Start Date End Date Taking? Authorizing Provider  amoxicillin (AMOXIL) 875 MG tablet Take 1 tablet (875 mg total) by mouth 2 (two) times daily. 05/25/15  Yes Carrie Mew, MD  diphenhydrAMINE (BENADRYL) 25 mg capsule Take 2 capsules (50 mg total) by mouth every 6 (six) hours as needed. 05/25/15  Yes Carrie Mew, MD  EPINEPHrine 0.3 mg/0.3 mL IJ SOAJ injection Inject 0.3 mLs (0.3 mg total) into the muscle once. Follow package instructions as needed for severe allergy or anaphylactic reaction. 05/25/15  Yes Carrie Mew, MD  predniSONE (DELTASONE) 20 MG tablet Take 2 tablets (40 mg total) by mouth daily. 05/25/15  Yes Carrie Mew, MD  sulfamethoxazole-trimethoprim (BACTRIM DS) 800-160 MG tablet Take 1 tablet by mouth 2 (two) times daily. 05/25/15  Yes Carrie Mew, MD  VITAL SIGNS:  Blood pressure 148/94, pulse 98, temperature 97.9 F (36.6 C), temperature source Oral, resp. rate 20, height 5\' 5"  (1.651 m), weight 241 lb (109.317 kg), last menstrual period 05/03/2015, SpO2 96  %.  PHYSICAL EXAMINATION:  VITAL SIGNS: Filed Vitals:   05/26/15 1923  BP: 148/94  Pulse: 98  Temp:   Resp: 20   GENERAL:44 y.o.female currently in no acute distress.  HEAD: Normocephalic, atraumatic.  EYES: Pupils equal, round, reactive to light. Extraocular muscles intact. No scleral icterus. Bilateral conjunctival hemorrhage MOUTH: Dry mucosal membrane. Dentition poor. No abscess noted.  EAR, NOSE, THROAT: Clear without exudates. No external lesions.  NECK: Supple. No thyromegaly. No nodules. No JVD.  PULMONARY: Clear to ascultation, without wheeze rails or rhonci. No use of accessory muscles, Good respiratory effort. good air entry bilaterally CHEST: Nontender to palpation.  CARDIOVASCULAR: S1 and S2. Regular rate and rhythm. No murmurs, rubs, or gallops. No edema. Pedal pulses 2+ bilaterally.  GASTROINTESTINAL: Soft, nontender, nondistended. No masses. Positive bowel sounds. No hepatosplenomegaly.  MUSCULOSKELETAL: No swelling, clubbing, or edema. Range of motion full in all extremities.  NEUROLOGIC: Cranial nerves II through XII are intact. No gross focal neurological deficits. Sensation intact. Reflexes intact.  SKIN: Erythema most prominent across the chest, palmar portions of the hand, No further ulceration, lesions, rashes, or cyanosis. Skin warm and dry. Turgor intact.  PSYCHIATRIC: Mood, affect within normal limits. The patient is awake, alert and oriented x 3. Insight, judgment intact.    LABORATORY PANEL:   CBC  Recent Labs Lab 05/26/15 1806  WBC 11.7*  HGB 11.1*  HCT 35.4  PLT 298   ------------------------------------------------------------------------------------------------------------------  Chemistries   Recent Labs Lab 05/26/15 1806  NA 137  K 4.6  CL 107  CO2 21*  GLUCOSE 119*  BUN 29*  CREATININE 1.33*  CALCIUM 8.7*  AST 22  ALT 26  ALKPHOS 75  BILITOT 0.5    ------------------------------------------------------------------------------------------------------------------  Cardiac Enzymes No results for input(s): TROPONINI in the last 168 hours. ------------------------------------------------------------------------------------------------------------------  RADIOLOGY:  Dg Chest 2 View  05/26/2015  CLINICAL DATA:  Acute onset of shortness of breath, facial swelling and diffuse itching. Initial encounter. EXAM: CHEST  2 VIEW COMPARISON:  None. FINDINGS: The lungs are well-aerated and clear. There is no evidence of focal opacification, pleural effusion or pneumothorax. The heart is borderline enlarged. No acute osseous abnormalities are seen. IMPRESSION: Borderline cardiomegaly.  Lungs remain grossly clear. Electronically Signed   By: Garald Balding M.D.   On: 05/26/2015 19:15    EKG:   Orders placed or performed during the hospital encounter of 05/25/15  . EKG 12-Lead  . EKG 12-Lead  . EKG 12-Lead  . EKG 12-Lead    IMPRESSION AND PLAN:   44 year old Caucasian female without significant medical history presenting with allergic reaction.  1. Allergic reaction: This is her third presentation within 1 week, continue with antihistamines as well as steroids, we'll check TSH she denies any other symptoms suggestive of rheumatological disorders 2. Bilateral conjunctival hemorrhage: She is initially started on antibiotics, these are necessary at this time I'll discontinue his antibiotics daily in setting of allergic reaction 3. Venous thrombus embolism prophylactic: Heparin subcutaneous    All the records are reviewed and case discussed with ED provider. Management plans discussed with the patient, family and they are in agreement.  CODE STATUS: Full  TOTAL TIME TAKING CARE OF THIS PATIENT: 35 minutes.    Hower,  Karenann Cai.D on 05/26/2015 at 8:53 PM  Between 7am to 6pm - Pager - [phone]  After 6pm: House Pager: -  [phone]  [facility]  Office  [phone]  CC: Primary care physician; Lorelee Market, MD

## 2015-05-26 NOTE — Progress Notes (Signed)
Pt arrived on unit.  Grace Fox

## 2015-05-26 NOTE — ED Provider Notes (Signed)
[facility] Emergency Department Provider Note     Time seen: ----------------------------------------- 5:58 PM on 05/26/2015 -----------------------------------------    I have reviewed the triage vital signs and the nursing notes.   HISTORY  Chief Complaint Allergic Reaction    HPI Grace Fox is a 44 y.o. female who presents ER withfacial swelling, itching all over and shortness of breath. Patient states she was seen in the ER twice in the last several days for allergic reaction, took ibuprofen today and in the shortness of breath became worse. Patient denies any change in her soaps or detergent shampoos foods or medicines. She has had Benadryl and steroids earlier today, had difficulty breathing earlier that has persisted with hoarse voice. Yesterday she had intense vomiting which caused bilateral subconjunctival hemorrhages.   History reviewed. No pertinent past medical history.  There are no active problems to display for this patient.   No past surgical history on file.  Allergies Ibuprofen  Social History Social History  Substance Use Topics  . Smoking status: Current Every Day Smoker -- 1.00 packs/day    Types: Cigarettes  . Smokeless tobacco: None  . Alcohol Use: No    Review of Systems Constitutional: Negative for fever. Eyes: Positive for bilateral eye redness ENT: Positive for hoarse voice, facial swelling Cardiovascular: Negative for chest pain. Respiratory: Positive for shortness of breath Gastrointestinal: Negative for abdominal pain, vomiting and diarrhea. Genitourinary: Negative for dysuria. Musculoskeletal: Negative for back pain. Skin: Negative for rash. Neurological: Negative for headaches, focal weakness or numbness.  10-point ROS otherwise negative.  ____________________________________________   PHYSICAL EXAM:  VITAL SIGNS: ED Triage Vitals  Enc Vitals Group     BP 05/26/15 1716 142/90 mmHg   Pulse Rate 05/26/15 1716 107     Resp 05/26/15 1716 22     Temp 05/26/15 1716 97.9 F (36.6 C)     Temp Source 05/26/15 1716 Oral     SpO2 05/26/15 1716 92 %     Weight 05/26/15 1716 241 lb (109.317 kg)     Height 05/26/15 1716 5\' 5"  (1.651 m)     Head Cir --      Peak Flow --      Pain Score 05/26/15 1717 4     Pain Loc --      Pain Edu? --      Excl. in [place]? --     Constitutional: Alert and oriented. Mild distress Eyes:  Bilateral subconjunctival hemorrhage ENT   Head: Facial edema and periorbital edema bilaterally, left greater than right   Nose: No congestion/rhinnorhea.   Mouth/Throat: Mucous membranes are moist. Hoarse voice   Neck: No stridor. Cardiovascular: Normal rate, regular rhythm. Normal and symmetric distal pulses are present in all extremities. No murmurs, rubs, or gallops. Respiratory: Normal respiratory effort without tachypnea nor retractions. Breath sounds are clear and equal bilaterally. No wheezes/rales/rhonchi. Gastrointestinal: Soft and nontender. No distention. No abdominal bruits.  Musculoskeletal: Nontender with normal range of motion in all extremities. No joint effusions.  No lower extremity tenderness nor edema. Neurologic:  Normal speech and language. No gross focal neurologic deficits are appreciated. Speech is normal. No gait instability. Skin:  Skin is warm, dry and intact. Mild facial erythema is noted Psychiatric: Mood and affect are normal. Speech and behavior are normal. Patient exhibits appropriate insight and judgment.  ____________________________________________  ED COURSE:  Pertinent labs & imaging results that were available during my care of the patient were reviewed by me and considered in  my medical decision making (see chart for details). Patient is in no acute distress, and clear etiology for this event. ____________________________________________    LABS (pertinent positives/negatives)  Labs Reviewed  CBC WITH  DIFFERENTIAL/PLATELET - Abnormal; Notable for the following:    WBC 11.7 (*)    Hemoglobin 11.1 (*)    MCHC 31.4 (*)    RDW 18.7 (*)    Neutro Abs 9.4 (*)    All other components within normal limits  COMPREHENSIVE METABOLIC PANEL - Abnormal; Notable for the following:    CO2 21 (*)    Glucose, Bld 119 (*)    BUN 29 (*)    Creatinine, Ser 1.33 (*)    Calcium 8.7 (*)    Albumin 3.4 (*)    GFR calc non Af Amer 49 (*)    GFR calc Af Amer 56 (*)    All other components within normal limits  PREGNANCY, URINE  URINALYSIS COMPLETEWITH MICROSCOPIC ([facility] ONLY)    RADIOLOGY Images were viewed by me  Chest x-ray Is unremarkable ____________________________________________  FINAL ASSESSMENT AND PLAN  Allergic reaction, dyspnea  Plan: Patient with labs and imaging as dictated above. Patient with her third visit in 3 days for allergic reaction. Unclear etiology for this, would recommend observation at this point with scheduled IV steroids and Benadryl to ensure improvement.    Earleen Newport, MD   Earleen Newport, MD 05/26/15 1934

## 2015-05-26 NOTE — ED Notes (Signed)
Care transferred to Grace Fox

## 2015-05-27 MED ORDER — PREDNISONE 50 MG PO TABS: 50.0000 mg | ORAL_TABLET | Freq: Every day | ORAL | Status: DC

## 2015-05-27 MED ORDER — DIPHENHYDRAMINE HCL 25 MG PO CAPS: 25.0000 mg | ORAL_CAPSULE | Freq: Four times a day (QID) | ORAL | Status: DC

## 2015-05-27 MED ORDER — DIPHENHYDRAMINE HCL 25 MG PO CAPS
25.0000 mg | ORAL_CAPSULE | Freq: Four times a day (QID) | ORAL | Status: DC | PRN
  Administered 2015-05-27: 25 mg via ORAL
  Filled 2015-05-27: qty 1

## 2015-05-27 MED ORDER — PREDNISONE 10 MG PO TABS: 50.0000 mg | ORAL_TABLET | Freq: Every day | ORAL | Status: DC

## 2015-05-27 MED ORDER — RANITIDINE HCL 75 MG PO TABS: 75.0000 mg | ORAL_TABLET | Freq: Two times a day (BID) | ORAL | Status: DC

## 2015-05-27 NOTE — Progress Notes (Signed)
Pt A and O x 4. VSS. Pt tolerating diet well. No complaints of pain or nausea. IV removed intact, prescriptions given. Pt voiced understanding of discharge instructions with no further questions. Pt discharged via wheelchair with axillary.   

## 2015-05-27 NOTE — Progress Notes (Signed)
Reassessed pt about right arm feeling like "jello", pt states that she has not felt that feeling since she was admitted. Will continue to monitor pt.   Angus Seller

## 2015-05-27 NOTE — Progress Notes (Signed)
Itching has subsided and redness has decreased in the facial area. Will continue to monitor pt.  Grace Fox

## 2015-05-27 NOTE — Discharge Summary (Signed)
[facility] [place] NAME: Grace Fox    MR#:  [id number]  DATE OF BIRTH:  Dec 11, 1970  DATE OF ADMISSION:  05/26/2015 ADMITTING PHYSICIAN: Lytle Butte, MD  DATE OF DISCHARGE: 05/27/15  PRIMARY CARE PHYSICIAN: Lorelee Market, MD    ADMISSION DIAGNOSIS:  Allergic reaction, subsequent encounter [T78.40XD]  DISCHARGE DIAGNOSIS:  Skin Rash  SECONDARY DIAGNOSIS:   Past Medical History  Diagnosis Date  . Pneumonia   . Hypertension   . Headache     HOSPITAL COURSE:   44 year old Caucasian female without significant medical history presenting with allergic reaction.  1. Allergic reaction: This is her third presentation within 1 week, continue with antihistamines as well as steroids and add zantac. -pt will be d/ced on po steroids, zantac and benadryl She is asked to make appt with Allergy specialist on Monday if shows no improvement.  2. Bilateral conjunctival hemorrhage: She is initially started on antibiotics, these are not necessary at this time  Will discontinue po antibiotics  in setting of allergic reaction  3. Venous thrombus embolism prophylactic: Heparin subcutaneous  D/c home later today  CONSULTS OBTAINED:   none  DRUG ALLERGIES:   Allergies  Allergen Reactions  . Ibuprofen Hives    DISCHARGE MEDICATIONS:   Current Discharge Medication List    START taking these medications   Details  ranitidine (ZANTAC 75) 75 MG tablet Take 1 tablet (75 mg total) by mouth 2 (two) times daily. Qty: 14 tablet, Refills: 0      CONTINUE these medications which have CHANGED   Details  diphenhydrAMINE (BENADRYL) 25 mg capsule Take 1 capsule (25 mg total) by mouth every 6 (six) hours. For 2-3 days and then as needed Qty: 30 capsule, Refills: 0    predniSONE (DELTASONE) 10 MG tablet Take 5 tablets (50 mg total) by mouth daily with breakfast. Taper by 10 mg daily then stop Qty: 15 tablet, Refills: 0       CONTINUE these medications which have NOT CHANGED   Details  EPINEPHrine 0.3 mg/0.3 mL IJ SOAJ injection Inject 0.3 mLs (0.3 mg total) into the muscle once. Follow package instructions as needed for severe allergy or anaphylactic reaction. Qty: 1 Device, Refills: 2      STOP taking these medications     amoxicillin (AMOXIL) 875 MG tablet      sulfamethoxazole-trimethoprim (BACTRIM DS) 800-160 MG tablet         If you experience worsening of your admission symptoms, develop shortness of breath, life threatening emergency, suicidal or homicidal thoughts you must seek medical attention immediately by calling 911 or calling your MD immediately  if symptoms less severe.  You Must read complete instructions/literature along with all the possible adverse reactions/side effects for all the Medicines you take and that have been prescribed to you. Take any new Medicines after you have completely understood and accept all the possible adverse reactions/side effects.   Please note  You were cared for by a hospitalist during your hospital stay. If you have any questions about your discharge medications or the care you received while you were in the hospital after you are discharged, you can call the unit and asked to speak with the hospitalist on call if the hospitalist that took care of you is not available. Once you are discharged, your primary care physician will handle any further medical issues. Please note that NO REFILLS for any discharge medications will be authorized once you are  discharged, as it is imperative that you return to your primary care physician (or establish a relationship with a primary care physician if you do not have one) for your aftercare needs so that they can reassess your need for medications and monitor your lab values. Today   SUBJECTIVE   itching in the palms  VITAL SIGNS:  Blood pressure 158/90, pulse 94, temperature 97.7 F (36.5 C), temperature source Oral,  resp. rate 20, height 5\' 6"  (1.676 m), weight 112.038 kg (247 lb), last menstrual period 05/03/2015, SpO2 97 %.  I/O:   Intake/Output Summary (Last 24 hours) at 05/27/15 1121 Last data filed at 05/27/15 0945  Gross per 24 hour  Intake      0 ml  Output      0 ml  Net      0 ml    PHYSICAL EXAMINATION:  GENERAL:  43 y.o.-year-old patient lying in the bed with no acute distress. obese EYES: Pupils equal, round, reactive to light and accommodation. No scleral icterus. Extraocular muscles intact.  HEENT: Head atraumatic, normocephalic. Oropharynx and nasopharynx clear. Poor dental hygiene NECK:  Supple, no jugular venous distention. No thyroid enlargement, no tenderness.  LUNGS: Normal breath sounds bilaterally, no wheezing, rales,rhonchi or crepitation. No use of accessory muscles of respiration.  CARDIOVASCULAR: S1, S2 normal. No murmurs, rubs, or gallops.  ABDOMEN: Soft, non-tender, non-distended. Bowel sounds present. No organomegaly or mass.  EXTREMITIES: No pedal edema, cyanosis, or clubbing. Mild redness in plams NEUROLOGIC: Cranial nerves II through XII are intact. Muscle strength 5/5 in all extremities. Sensation intact. Gait not checked.  PSYCHIATRIC: The patient is alert and oriented x 3.  SKIN: No obvious rash, lesion, or ulcer.   DATA REVIEW:   CBC   Recent Labs Lab 05/26/15 1806  WBC 11.7*  HGB 11.1*  HCT 35.4  PLT 298    Chemistries   Recent Labs Lab 05/26/15 1806  NA 137  K 4.6  CL 107  CO2 21*  GLUCOSE 119*  BUN 29*  CREATININE 1.33*  CALCIUM 8.7*  AST 22  ALT 26  ALKPHOS 75  BILITOT 0.5    Microbiology Results   No results found for this or any previous visit (from the past 240 hour(s)).  RADIOLOGY:  Dg Chest 2 View  05/26/2015  CLINICAL DATA:  Acute onset of shortness of breath, facial swelling and diffuse itching. Initial encounter. EXAM: CHEST  2 VIEW COMPARISON:  None. FINDINGS: The lungs are well-aerated and clear. There is no  evidence of focal opacification, pleural effusion or pneumothorax. The heart is borderline enlarged. No acute osseous abnormalities are seen. IMPRESSION: Borderline cardiomegaly.  Lungs remain grossly clear. Electronically Signed   By: Garald Balding M.D.   On: 05/26/2015 19:15     Management plans discussed with the patient, family and they are in agreement.  CODE STATUS:     Code Status Orders        Start     Ordered   05/26/15 2036  Full code   Continuous     05/26/15 2035      TOTAL TIME TAKING CARE OF THIS PATIENT: 40 minutes.    Jearlene Bridwell M.D on 05/27/2015 at 11:21 AM  Between 7am to 6pm - Pager - [phone] After 6pm go to [email or web address] - password [email or web address] [facility]  Office  [phone]  CC: Primary care physician; Lorelee Market, MD

## 2015-05-27 NOTE — Discharge Instructions (Signed)
If no improvement in your symptoms then make appt with Allergy specialist on Monday [place] all over your skin 3-4 times a day

## 2015-06-28 ENCOUNTER — Emergency Department: Payer: Medicaid Other

## 2015-06-28 ENCOUNTER — Inpatient Hospital Stay
Admission: EM | Admit: 2015-06-28 | Discharge: 2015-06-30 | DRG: 392 | Disposition: A | Discharge status: Home / Self Care | Payer: Medicaid Other | Attending: Surgery | Admitting: Surgery

## 2015-06-28 ENCOUNTER — Other Ambulatory Visit: Payer: Self-pay

## 2015-06-28 ENCOUNTER — Encounter: Payer: Self-pay | Admitting: *Deleted

## 2015-06-28 DIAGNOSIS — K810 Acute cholecystitis: Secondary | ICD-10-CM | POA: Diagnosis present

## 2015-06-28 DIAGNOSIS — A419 Sepsis, unspecified organism: Secondary | ICD-10-CM

## 2015-06-28 DIAGNOSIS — Z8249 Family history of ischemic heart disease and other diseases of the circulatory system: Secondary | ICD-10-CM | POA: Diagnosis not present

## 2015-06-28 DIAGNOSIS — E440 Moderate protein-calorie malnutrition: Secondary | ICD-10-CM | POA: Insufficient documentation

## 2015-06-28 DIAGNOSIS — R1011 Right upper quadrant pain: Secondary | ICD-10-CM | POA: Diagnosis not present

## 2015-06-28 DIAGNOSIS — F17210 Nicotine dependence, cigarettes, uncomplicated: Secondary | ICD-10-CM | POA: Diagnosis present

## 2015-06-28 DIAGNOSIS — D509 Iron deficiency anemia, unspecified: Secondary | ICD-10-CM | POA: Diagnosis present

## 2015-06-28 DIAGNOSIS — R1013 Epigastric pain: Secondary | ICD-10-CM | POA: Diagnosis not present

## 2015-06-28 DIAGNOSIS — I10 Essential (primary) hypertension: Secondary | ICD-10-CM | POA: Diagnosis present

## 2015-06-28 LAB — HEPATIC FUNCTION PANEL
ALBUMIN: 3.7 g/dL (ref 3.5–5.0)
ALK PHOS: 97 U/L (ref 38–126)
ALT: 20 U/L (ref 14–54)
AST: 18 U/L (ref 15–41)
BILIRUBIN TOTAL: 0.7 mg/dL (ref 0.3–1.2)
Bilirubin, Direct: 0.2 mg/dL (ref 0.1–0.5)
Indirect Bilirubin: 0.5 mg/dL (ref 0.3–0.9)
Total Protein: 6.8 g/dL (ref 6.5–8.1)

## 2015-06-28 LAB — CBC
HCT: 36.7 % (ref 35.0–47.0)
HEMOGLOBIN: 11.3 g/dL — AB (ref 12.0–16.0)
MCH: 24.4 pg — AB (ref 26.0–34.0)
MCHC: 30.8 g/dL — ABNORMAL LOW (ref 32.0–36.0)
MCV: 79.2 fL — AB (ref 80.0–100.0)
Platelets: 236 10*3/uL (ref 150–440)
RBC: 4.64 MIL/uL (ref 3.80–5.20)
RDW: 18.4 % — ABNORMAL HIGH (ref 11.5–14.5)
WBC: 9.5 10*3/uL (ref 3.6–11.0)

## 2015-06-28 LAB — BASIC METABOLIC PANEL
ANION GAP: 9 (ref 5–15)
BUN: 17 mg/dL (ref 6–20)
CHLORIDE: 100 mmol/L — AB (ref 101–111)
CO2: 26 mmol/L (ref 22–32)
CREATININE: 1.27 mg/dL — AB (ref 0.44–1.00)
Calcium: 8.5 mg/dL — ABNORMAL LOW (ref 8.9–10.3)
GFR calc non Af Amer: 51 mL/min — ABNORMAL LOW (ref >60)
GFR, EST AFRICAN AMERICAN: 59 mL/min — AB (ref >60)
Glucose, Bld: 109 mg/dL — ABNORMAL HIGH (ref 65–99)
Potassium: 3.3 mmol/L — ABNORMAL LOW (ref 3.5–5.1)
Sodium: 135 mmol/L (ref 135–145)

## 2015-06-28 LAB — URINALYSIS COMPLETE WITH MICROSCOPIC (ARMC ONLY)
BACTERIA UA: NONE SEEN
Bilirubin Urine: NEGATIVE
Glucose, UA: NEGATIVE mg/dL
Ketones, ur: NEGATIVE mg/dL
LEUKOCYTES UA: NEGATIVE
NITRITE: NEGATIVE
PROTEIN: 30 mg/dL — AB
SPECIFIC GRAVITY, URINE: 1.009 (ref 1.005–1.030)
Squamous Epithelial / LPF: NONE SEEN
pH: 6 (ref 5.0–8.0)

## 2015-06-28 LAB — LIPASE, BLOOD: Lipase: 41 U/L (ref 11–51)

## 2015-06-28 LAB — LACTIC ACID, PLASMA
Lactic Acid, Venous: 1.3 mmol/L (ref 0.5–2.0)
Lactic Acid, Venous: 1.3 mmol/L (ref 0.5–2.0)

## 2015-06-28 LAB — BRAIN NATRIURETIC PEPTIDE: B Natriuretic Peptide: 1491 pg/mL — ABNORMAL HIGH (ref 0.0–100.0)

## 2015-06-28 LAB — TROPONIN I: Troponin I: 0.07 ng/mL — ABNORMAL HIGH (ref <0.031)

## 2015-06-28 MED ORDER — MORPHINE SULFATE (PF) 2 MG/ML IV SOLN
2.0000 mg | INTRAVENOUS | Status: DC | PRN
  Administered 2015-06-29 (×4): 2 mg via INTRAVENOUS
  Filled 2015-06-28 (×4): qty 1

## 2015-06-28 MED ORDER — PIPERACILLIN-TAZOBACTAM 3.375 G IVPB
3.3750 g | Freq: Once | INTRAVENOUS | Status: AC
  Administered 2015-06-28: 3.375 g via INTRAVENOUS
  Filled 2015-06-28: qty 50

## 2015-06-28 MED ORDER — METOPROLOL SUCCINATE ER 50 MG PO TB24
25.0000 mg | ORAL_TABLET | Freq: Every day | ORAL | Status: DC
  Administered 2015-06-29 – 2015-06-30 (×2): 25 mg via ORAL
  Filled 2015-06-28 (×3): qty 1

## 2015-06-28 MED ORDER — LACTATED RINGERS IV SOLN
INTRAVENOUS | Status: DC
  Administered 2015-06-29 – 2015-06-30 (×4): via INTRAVENOUS

## 2015-06-28 MED ORDER — ONDANSETRON HCL 4 MG/2ML IJ SOLN
4.0000 mg | Freq: Once | INTRAMUSCULAR | Status: AC
  Administered 2015-06-28: 4 mg via INTRAVENOUS
  Filled 2015-06-28: qty 2

## 2015-06-28 MED ORDER — SODIUM CHLORIDE 0.9 % IV BOLUS (SEPSIS)
30.0000 mL/kg | Freq: Once | INTRAVENOUS | Status: AC
  Administered 2015-06-28: 3210 mL via INTRAVENOUS

## 2015-06-28 MED ORDER — SODIUM CHLORIDE 0.9 % IV SOLN
1500.0000 mg | Freq: Once | INTRAVENOUS | Status: AC
  Administered 2015-06-28: 1500 mg via INTRAVENOUS
  Filled 2015-06-28: qty 1500

## 2015-06-28 MED ORDER — AMPICILLIN-SULBACTAM SODIUM 3 (2-1) G IJ SOLR
3.0000 g | Freq: Four times a day (QID) | INTRAMUSCULAR | Status: DC
  Administered 2015-06-29 – 2015-06-30 (×6): 3 g via INTRAVENOUS
  Filled 2015-06-28 (×7): qty 3

## 2015-06-28 MED ORDER — HEPARIN SODIUM (PORCINE) 5000 UNIT/ML IJ SOLN
5000.0000 [IU] | Freq: Three times a day (TID) | INTRAMUSCULAR | Status: DC
  Administered 2015-06-29 – 2015-06-30 (×5): 5000 [IU] via SUBCUTANEOUS
  Filled 2015-06-28 (×4): qty 1

## 2015-06-28 MED ORDER — HYDROMORPHONE HCL 1 MG/ML IJ SOLN
1.0000 mg | Freq: Once | INTRAMUSCULAR | Status: AC
  Administered 2015-06-28: 1 mg via INTRAVENOUS
  Filled 2015-06-28: qty 1

## 2015-06-28 MED ORDER — ONDANSETRON HCL 4 MG PO TABS: 4.0000 mg | ORAL_TABLET | Freq: Four times a day (QID) | ORAL | Status: DC | PRN

## 2015-06-28 MED ORDER — ONDANSETRON HCL 4 MG/2ML IJ SOLN
4.0000 mg | Freq: Four times a day (QID) | INTRAMUSCULAR | Status: DC | PRN
  Administered 2015-06-29 (×2): 4 mg via INTRAVENOUS
  Filled 2015-06-28 (×2): qty 2

## 2015-06-28 NOTE — ED Notes (Signed)
Pt taken to US

## 2015-06-28 NOTE — ED Notes (Signed)
Patient c/o sharp, intermittent chest pain that started today. Patient saw her cardiologist two days ago due to tachycardia, BLE edema, and after an echocardiogram. Patient had a stress test yesterday. Patient is short of breath in triage.

## 2015-06-28 NOTE — ED Provider Notes (Signed)
[facility] Emergency Department Provider Note  ____________________________________________  Time seen: 5:45 PM  I have reviewed the triage vital signs and the nursing notes.   HISTORY  Chief Complaint Chest Pain  History Limited by Patient is a poor historian due to severe pain  HPI Grace Fox is a 44 y.o. female history of hypertension who complains of bilateral lower chest pain. Not exertional, not pleuritic. Recently had a workup with cardiology.  Pain is intermittent, sharp, nonradiating, no shortness of breath vomiting diaphoresis or dizziness. She does feel nauseated. She also has a history of cholelithiasis and is getting pain with eating.       Past Medical History  Diagnosis Date  . Pneumonia   . Hypertension   . Headache      Patient Active Problem List   Diagnosis Date Noted  . Allergic reaction 05/26/2015     History reviewed. No pertinent past surgical history.   Current Outpatient Rx  Name  Route  Sig  Dispense  Refill  . albuterol (PROVENTIL HFA;VENTOLIN HFA) 108 (90 BASE) MCG/ACT inhaler   Inhalation   Inhale 2 puffs into the lungs every 4 (four) hours as needed for wheezing or shortness of breath.         . budesonide-formoterol (SYMBICORT) 160-4.5 MCG/ACT inhaler   Inhalation   Inhale 2 puffs into the lungs 2 (two) times daily.         Marland Kitchen EPINEPHrine 0.3 mg/0.3 mL IJ SOAJ injection   Intramuscular   Inject 0.3 mLs (0.3 mg total) into the muscle once. Follow package instructions as needed for severe allergy or anaphylactic reaction.   1 Device   2   . furosemide (LASIX) 40 MG tablet   Oral   Take 40 mg by mouth daily.         . metoprolol succinate (TOPROL-XL) 50 MG 24 hr tablet   Oral   Take 50 mg by mouth daily. Take with or immediately following a meal.         . potassium chloride (K-DUR,KLOR-CON) 10 MEQ tablet   Oral   Take 10 mEq by mouth daily.         . diphenhydrAMINE (BENADRYL) 25  mg capsule   Oral   Take 1 capsule (25 mg total) by mouth every 6 (six) hours. For 2-3 days and then as needed   30 capsule   0   . predniSONE (DELTASONE) 10 MG tablet   Oral   Take 5 tablets (50 mg total) by mouth daily with breakfast. Taper by 10 mg daily then stop   15 tablet   0   . ranitidine (ZANTAC 75) 75 MG tablet   Oral   Take 1 tablet (75 mg total) by mouth 2 (two) times daily.   14 tablet   0      Allergies Review of patient's allergies indicates no active allergies.   Family History  Problem Relation Age of Onset  . Diabetes Other   . Heart failure Other     Social History Social History  Substance Use Topics  . Smoking status: Current Every Day Smoker -- 1.00 packs/day    Types: Cigarettes  . Smokeless tobacco: None  . Alcohol Use: No    Review of Systems  Constitutional:  Positive subjective fever and chills. No weight changes Eyes:   No blurry vision or double vision.  ENT:   No sore throat. Cardiovascular:   Positive chest pain. Respiratory:  No dyspnea or cough. Gastrointestinal:   Positive for abdominal pain,without vomiting and diarrhea.  No BRBPR or melena. Genitourinary:   Negative for dysuria, urinary retention, bloody urine, or difficulty urinating. Musculoskeletal:   Negative for back pain. No joint swelling or pain. Skin:   Negative for rash. Neurological:   Negative for headaches, focal weakness or numbness. Psychiatric:  No anxiety or depression.   Endocrine:  No hot/cold intolerance, changes in energy, or sleep difficulty.  10-point ROS otherwise negative.  ____________________________________________   PHYSICAL EXAM:  VITAL SIGNS: ED Triage Vitals  Enc Vitals Group     BP 06/28/15 1643 158/90 mmHg     Pulse Rate 06/28/15 1643 127     Resp 06/28/15 1643 24     Temp 06/28/15 1643 99.7 F (37.6 C)     Temp Source 06/28/15 1643 Oral     SpO2 06/28/15 1643 100 %     Weight 06/28/15 1643 236 lb (107.049 kg)     Height  06/28/15 1643 5\' 6"  (1.676 m)     Head Cir --      Peak Flow --      Pain Score 06/28/15 1644 7     Pain Loc --      Pain Edu? --      Excl. in [place]? --     Vital signs reviewed, nursing assessments reviewed.   Constitutional:   Alert and oriented. Moderate distress due to pain. Eyes:   No scleral icterus. No conjunctival pallor. PERRL. EOMI ENT   Head:   Normocephalic and atraumatic.   Nose:   No congestion/rhinnorhea. No septal hematoma   Mouth/Throat:   Dry mucous membranes, no pharyngeal erythema. No peritonsillar mass. No uvula shift.   Neck:   No stridor. No SubQ emphysema. No meningismus. Hematological/Lymphatic/Immunilogical:   No cervical lymphadenopathy. Cardiovascular:   Tachycardia heart rate 125 to 1:30. Normal and symmetric distal pulses are present in all extremities. No murmurs, rubs, or gallops. Respiratory:   Normal respiratory effort without tachypnea nor retractions. Breath sounds are clear and equal bilaterally. No wheezes/rales/rhonchi. Gastrointestinal:   Soft with severe right upper quadrant tenderness and mild epigastric and left upper quadrant tenderness, positive voluntary guarding.. No distention. There is no CVA tenderness.  No rebound, rigidity. Genitourinary:   deferred Musculoskeletal:   Nontender with normal range of motion in all extremities. No joint effusions.  No lower extremity tenderness.  No edema. Neurologic:   Normal speech and language.  CN 2-10 normal. Motor grossly intact. No pronator drift.  Normal gait. No gross focal neurologic deficits are appreciated.  Skin:    Skin is warm, dry and intact. No rash noted.  No petechiae, purpura, or bullae. Psychiatric:   Mood and affect are normal. Speech and behavior are normal. Patient exhibits appropriate insight and judgment.  ____________________________________________    LABS (pertinent positives/negatives) (all labs ordered are listed, but only abnormal results are  displayed) Labs Reviewed  BASIC METABOLIC PANEL - Abnormal; Notable for the following:    Potassium 3.3 (*)    Chloride 100 (*)    Glucose, Bld 109 (*)    Creatinine, Ser 1.27 (*)    Calcium 8.5 (*)    GFR calc non Af Amer 51 (*)    GFR calc Af Amer 59 (*)    All other components within normal limits  CBC - Abnormal; Notable for the following:    Hemoglobin 11.3 (*)    MCV 79.2 (*)    MCH 24.4 (*)  MCHC 30.8 (*)    RDW 18.4 (*)    All other components within normal limits  TROPONIN I - Abnormal; Notable for the following:    Troponin I 0.07 (*)    All other components within normal limits  BRAIN NATRIURETIC PEPTIDE - Abnormal; Notable for the following:    B Natriuretic Peptide 1491.0 (*)    All other components within normal limits  URINALYSIS COMPLETEWITH MICROSCOPIC ([facility] ONLY) - Abnormal; Notable for the following:    Color, Urine YELLOW (*)    APPearance CLEAR (*)    Hgb urine dipstick 1+ (*)    Protein, ur 30 (*)    All other components within normal limits  CULTURE, BLOOD (ROUTINE X 2)  CULTURE, BLOOD (ROUTINE X 2)  URINE CULTURE  HEPATIC FUNCTION PANEL  LIPASE, BLOOD  LACTIC ACID, PLASMA  LACTIC ACID, PLASMA   ____________________________________________   EKG  Interpreted by me And his tachycardia rate 124, normal axis normal intervals, normal QRS ST segments and T waves.  ____________________________________________    RADIOLOGY  Chest x-ray unremarkable Ultrasound right upper quadrant reveals mild gallbladder wall thickening and minimal pericholecystic fluid concerning for possible early cholecystitis.  ____________________________________________   PROCEDURES CRITICAL CARE Performed by: Grace Fox, Grace Fox   Total critical care time: 35 minutes  Critical care time was exclusive of separately billable procedures and treating other patients.  Critical care was necessary to treat or prevent imminent or life-threatening  deterioration.  Critical care was time spent personally by me on the following activities: development of treatment plan with patient and/or surrogate as well as nursing, discussions with consultants, evaluation of patient's response to treatment, examination of patient, obtaining history from patient or surrogate, ordering and performing treatments and interventions, ordering and review of laboratory studies, ordering and review of radiographic studies, pulse oximetry and re-evaluation of patient's condition.   ____________________________________________   INITIAL IMPRESSION / ASSESSMENT AND PLAN / ED COURSE  Pertinent labs & imaging results that were available during my care of the patient were reviewed by me and considered in my medical decision making (see chart for details).  Patient presents with severe pain that localizes to the right upper quadrant on exam. She is febrile and tachycardic. We'll give IV fluids started with a 30 mL/kg bolus over the first 4 hours. I'll give her empiric vancomycin and Zosyn. IV Zofran and Dilaudid for symptoms. We'll get an ultrasound of the right upper quadrant will checking labs including lactate and blood cultures.  ----------------------------------------- 8:28 PM on 06/28/2015 -----------------------------------------  Workup significant for abnormalities on gallbladder ultrasound. Labs otherwise at baseline or essentially unremarkable. I discussed the case with the surgeon on call Dr. Burt Fox at about 8:20 PM. After his evaluation in the emergency department, I discussed the case with him, and he plans to continue antibiotics and hospitalize for in-hospital HIDA scan..     ____________________________________________   FINAL CLINICAL IMPRESSION(S) / ED DIAGNOSES  Final diagnoses:  Sepsis, due to unspecified organism [facility])  RUQ abdominal pain      Grace Mew, MD 06/28/15 2039

## 2015-06-28 NOTE — H&P (Signed)
Grace Fox is an 44 y.o. female.    Chief Complaint: Abdominal pain  HPI: Patient describes abdominal pain across the epigastrium right upper quadrant and right back it started at 2:00 today and she started having fevers at 2:00 today as well she had no symptoms this morning or yesterday and has never had an episode like this before in the emergency room as well. She's had nausea but no emesis normal bowel movements no melena or hematochezia. Asked about her medications and why she is on and she cannot answer single question about what why she is taking a certain medication. For her heart or lungs. She was told to take them.  Past Medical History  Diagnosis Date  . Pneumonia   . Hypertension   . Headache     History reviewed. No pertinent past surgical history.  Family History  Problem Relation Age of Onset  . Diabetes Other   . Heart failure Other    Social History:  reports that she has been smoking Cigarettes.  She has been smoking about 1.00 pack per day. She does not have any smokeless tobacco history on file. She reports that she does not drink alcohol or use illicit drugs.  Allergies: No Active Allergies   (Not in a hospital admission)   Review of Systems  Constitutional: Positive for fever. Negative for chills, weight loss and malaise/fatigue.  HENT: Negative.   Eyes: Negative.   Respiratory: Positive for cough. Negative for hemoptysis, sputum production and shortness of breath.   Cardiovascular: Negative for chest pain, palpitations and orthopnea.  Gastrointestinal: Positive for nausea and abdominal pain. Negative for heartburn, vomiting, diarrhea, constipation, blood in stool and melena.  Genitourinary: Negative.   Musculoskeletal: Negative.   Skin: Negative.   Neurological: Negative.   Endo/Heme/Allergies: Negative.   Psychiatric/Behavioral: Negative.      Physical Exam:  BP 157/94 mmHg  Pulse 116  Temp(Src) 98.5 F (36.9 C) (Oral)  Resp 21   Ht '5\' 6"'$  (1.676 m)  Wt 236 lb (107.049 kg)  BMI 38.11 kg/m2  SpO2 90%  Physical Exam  Constitutional: She is oriented to person, place, and time and well-developed, well-nourished, and in no distress. No distress.  Morbidly obese no acute distress Very poor dentition   HENT:  Head: Normocephalic and atraumatic.  Mouth/Throat: No oropharyngeal exudate.  Eyes: Pupils are equal, round, and reactive to light. Right eye exhibits no discharge. Left eye exhibits no discharge. No scleral icterus.  Neck: Normal range of motion.  Cardiovascular: Normal rate, regular rhythm and normal heart sounds.   No murmur heard. Pulmonary/Chest: Effort normal and breath sounds normal. No respiratory distress. She has no wheezes.  Abdominal: Soft. She exhibits no distension. There is tenderness. There is no rebound and no guarding.  Right upper quadrant tenderness with a positive Murphy sign  Musculoskeletal: Normal range of motion. She exhibits no edema.  Lymphadenopathy:    She has no cervical adenopathy.  Neurological: She is alert and oriented to person, place, and time.  Skin: Skin is warm and dry. She is not diaphoretic. No erythema.  Psychiatric: Mood and affect normal.        Results for orders placed or performed during the hospital encounter of 06/28/15 (from the past 48 hour(s))  Basic metabolic panel     Status: Abnormal   Collection Time: 06/28/15  4:48 PM  Result Value Ref Range   Sodium 135 135 - 145 mmol/L   Potassium 3.3 (L) 3.5 - 5.1 mmol/L  Chloride 100 (L) 101 - 111 mmol/L   CO2 26 22 - 32 mmol/L   Glucose, Bld 109 (H) 65 - 99 mg/dL   BUN 17 6 - 20 mg/dL   Creatinine, Ser 1.27 (H) 0.44 - 1.00 mg/dL   Calcium 8.5 (L) 8.9 - 10.3 mg/dL   GFR calc non Af Amer 51 (L) >60 mL/min   GFR calc Af Amer 59 (L) >60 mL/min    Comment: (NOTE) The eGFR has been calculated using the CKD EPI equation. This calculation has not been validated in all clinical situations. eGFR's persistently  <60 mL/min signify possible Chronic Kidney Disease.    Anion gap 9 5 - 15  CBC     Status: Abnormal   Collection Time: 06/28/15  4:48 PM  Result Value Ref Range   WBC 9.5 3.6 - 11.0 K/uL   RBC 4.64 3.80 - 5.20 MIL/uL   Hemoglobin 11.3 (L) 12.0 - 16.0 g/dL   HCT 36.7 35.0 - 47.0 %   MCV 79.2 (L) 80.0 - 100.0 fL   MCH 24.4 (L) 26.0 - 34.0 pg   MCHC 30.8 (L) 32.0 - 36.0 g/dL   RDW 18.4 (H) 11.5 - 14.5 %   Platelets 236 150 - 440 K/uL  Troponin I     Status: Abnormal   Collection Time: 06/28/15  4:48 PM  Result Value Ref Range   Troponin I 0.07 (H) <0.031 ng/mL    Comment: READ BACK AND VERIFIED WITH [facility]  YUAL AT 8101 06/28/15 SDR        PERSISTENTLY INCREASED TROPONIN VALUES IN THE RANGE OF 0.04-0.49 ng/mL CAN BE SEEN IN:       -UNSTABLE ANGINA       -CONGESTIVE HEART FAILURE       -MYOCARDITIS       -CHEST TRAUMA       -ARRYHTHMIAS       -LATE PRESENTING MYOCARDIAL INFARCTION       -COPD   CLINICAL FOLLOW-UP RECOMMENDED.   Brain natriuretic peptide     Status: Abnormal   Collection Time: 06/28/15  4:48 PM  Result Value Ref Range   B Natriuretic Peptide 1491.0 (H) 0.0 - 100.0 pg/mL  Hepatic function panel     Status: None   Collection Time: 06/28/15  4:48 PM  Result Value Ref Range   Total Protein 6.8 6.5 - 8.1 g/dL   Albumin 3.7 3.5 - 5.0 g/dL   AST 18 15 - 41 U/L   ALT 20 14 - 54 U/L   Alkaline Phosphatase 97 38 - 126 U/L   Total Bilirubin 0.7 0.3 - 1.2 mg/dL   Bilirubin, Direct 0.2 0.1 - 0.5 mg/dL   Indirect Bilirubin 0.5 0.3 - 0.9 mg/dL  Lipase, blood     Status: None   Collection Time: 06/28/15  4:48 PM  Result Value Ref Range   Lipase 41 11 - 51 U/L  Lactic acid, plasma     Status: None   Collection Time: 06/28/15  6:27 PM  Result Value Ref Range   Lactic Acid, Venous 1.3 0.5 - 2.0 mmol/L  Urinalysis complete, with microscopic     Status: Abnormal   Collection Time: 06/28/15  6:27 PM  Result Value Ref Range   Color, Urine YELLOW (A) YELLOW    APPearance CLEAR (A) CLEAR   Glucose, UA NEGATIVE NEGATIVE mg/dL   Bilirubin Urine NEGATIVE NEGATIVE   Ketones, ur NEGATIVE NEGATIVE mg/dL   Specific Gravity, Urine 1.009 1.005 - 1.030  Hgb urine dipstick 1+ (A) NEGATIVE   pH 6.0 5.0 - 8.0   Protein, ur 30 (A) NEGATIVE mg/dL   Nitrite NEGATIVE NEGATIVE   Leukocytes, UA NEGATIVE NEGATIVE   RBC / HPF 0-5 0 - 5 RBC/hpf   WBC, UA 0-5 0 - 5 WBC/hpf   Bacteria, UA NONE SEEN NONE SEEN   Squamous Epithelial / LPF NONE SEEN NONE SEEN   Mucous PRESENT    Dg Chest 2 View  06/28/2015  CLINICAL DATA:  One day history of chest pain EXAM: CHEST  2 VIEW COMPARISON:  May 26, 2015 FINDINGS: There is no edema or consolidation. Heart is mildly enlarged with pulmonary vascularity within normal limits. No adenopathy. No pneumothorax. No bone lesions. IMPRESSION: Cardiomegaly.  No edema or consolidation. Electronically Signed   By: Lowella Grip III M.D.   On: 06/28/2015 17:42   US Abdomen Limited Ruq  06/28/2015  CLINICAL DATA:  Right upper quadrant pain with fever and tenderness for 1 day EXAM: US ABDOMEN LIMITED - RIGHT UPPER QUADRANT COMPARISON:  None. FINDINGS: Gallbladder: Within the gallbladder, there are multiple echogenic foci which move and shadow consistent with gallstones. Largest individual gallstone measures 6 mm in length. Gallbladder wall is thickened in portions with minimal pericholecystic fluid. No sonographic Murphy sign noted by sonographer. Common bile duct: Diameter: 4 mm. There is no intrahepatic or extrahepatic biliary duct dilatation. Liver: No focal lesion identified. Within normal limits in parenchymal echogenicity. IMPRESSION: Cholelithiasis. Areas of mild gallbladder wall thickening with minimal pericholecystic fluid. These findings are concerning for early acute cholecystitis. Study otherwise unremarkable. Electronically Signed   By: Lowella Grip III M.D.   On: 06/28/2015 20:04     Assessment/Plan  Normal labs  normal LFTs but an ultrasound suggesting early acute cholecystitis. She is generally tender in the right upper quadrant. I've heart time reconciling acute cholecystitis as the diagnosis however because her pain started only a few hours ago at the same time as she started having high fevers. This does not fit typical acute cholecystitis. Plan would be to admit her to the hospital hydrated control pain and nausea and obtain a HIDA scan in the morning. If she does indeed have acute cholecystitis and laparoscopic cholecystectomy would be indicated. This is discussed with the emergency room physician and I will review her care with Dr. Marina Gravel in the morning  Florene Glen, MD, FACS

## 2015-06-29 ENCOUNTER — Inpatient Hospital Stay: Payer: Medicaid Other

## 2015-06-29 DIAGNOSIS — R1011 Right upper quadrant pain: Secondary | ICD-10-CM | POA: Insufficient documentation

## 2015-06-29 LAB — CBC
HEMATOCRIT: 34.7 % — AB (ref 35.0–47.0)
HEMOGLOBIN: 10.8 g/dL — AB (ref 12.0–16.0)
MCH: 24.8 pg — ABNORMAL LOW (ref 26.0–34.0)
MCHC: 31 g/dL — ABNORMAL LOW (ref 32.0–36.0)
MCV: 80 fL (ref 80.0–100.0)
Platelets: 155 10*3/uL (ref 150–440)
RBC: 4.34 MIL/uL (ref 3.80–5.20)
RDW: 18.5 % — AB (ref 11.5–14.5)
WBC: 9.2 10*3/uL (ref 3.6–11.0)

## 2015-06-29 LAB — COMPREHENSIVE METABOLIC PANEL
ALT: 17 U/L (ref 14–54)
ANION GAP: 9 (ref 5–15)
AST: 18 U/L (ref 15–41)
Albumin: 3.3 g/dL — ABNORMAL LOW (ref 3.5–5.0)
Alkaline Phosphatase: 79 U/L (ref 38–126)
BILIRUBIN TOTAL: 0.9 mg/dL (ref 0.3–1.2)
BUN: 14 mg/dL (ref 6–20)
CHLORIDE: 104 mmol/L (ref 101–111)
CO2: 23 mmol/L (ref 22–32)
Calcium: 7.8 mg/dL — ABNORMAL LOW (ref 8.9–10.3)
Creatinine, Ser: 1.31 mg/dL — ABNORMAL HIGH (ref 0.44–1.00)
GFR, EST AFRICAN AMERICAN: 57 mL/min — AB (ref >60)
GFR, EST NON AFRICAN AMERICAN: 49 mL/min — AB (ref >60)
Glucose, Bld: 93 mg/dL (ref 65–99)
POTASSIUM: 3.7 mmol/L (ref 3.5–5.1)
Sodium: 136 mmol/L (ref 135–145)
TOTAL PROTEIN: 6.2 g/dL — AB (ref 6.5–8.1)

## 2015-06-29 MED ORDER — TECHNETIUM TC 99M MEBROFENIN IV KIT
5.0420 | PACK | Freq: Once | INTRAVENOUS | Status: AC | PRN
  Administered 2015-06-29: 5.042 via INTRAVENOUS

## 2015-06-29 MED ORDER — CETYLPYRIDINIUM CHLORIDE 0.05 % MT LIQD
7.0000 mL | Freq: Two times a day (BID) | OROMUCOSAL | Status: DC
  Administered 2015-06-29 – 2015-06-30 (×4): 7 mL via OROMUCOSAL

## 2015-06-29 MED ORDER — PANTOPRAZOLE SODIUM 40 MG PO TBEC
40.0000 mg | DELAYED_RELEASE_TABLET | Freq: Two times a day (BID) | ORAL | Status: DC
  Administered 2015-06-29 – 2015-06-30 (×2): 40 mg via ORAL
  Filled 2015-06-29 (×2): qty 1

## 2015-06-29 NOTE — Progress Notes (Signed)
Initial Nutrition Assessment  DOCUMENTATION CODES:   Non-severe (moderate) malnutrition in context of acute illness/injury  INTERVENTION:  Meals and snacks: Await diet progression     NUTRITION DIAGNOSIS:   Inadequate oral intake related to altered GI function as evidenced by NPO status.    GOAL:   Patient will meet greater than or equal to 90% of their needs    MONITOR:    (Energy intake, Digestive system)  REASON FOR ASSESSMENT:   Malnutrition Screening Tool    ASSESSMENT:      Pt admitted with possible acute cholecystitis.  Planning HIDA scan today  Past Medical History  Diagnosis Date  . Pneumonia   . Hypertension   . Headache     Current Nutrition: NPO  Food/Nutrition-Related History: Pt reports appetite up and down.  Appetite has been down for the past week per pt   Scheduled Medications:  . ampicillin-sulbactam (UNASYN) IV  3 g Intravenous Q6H  . antiseptic oral rinse  7 mL Mouth Rinse BID  . heparin  5,000 Units Subcutaneous 3 times per day  . metoprolol succinate  25 mg Oral Daily    Continuous Medications:  . lactated ringers Stopped (06/29/15 1334)     Electrolyte/Renal Profile and Glucose Profile:   Recent Labs Lab 06/28/15 1648 06/29/15 0421  NA 135 136  K 3.3* 3.7  CL 100* 104  CO2 26 23  BUN 17 14  CREATININE 1.27* 1.31*  CALCIUM 8.5* 7.8*  GLUCOSE 109* 93   Protein Profile:   Recent Labs Lab 06/28/15 1648 06/29/15 0421  ALBUMIN 3.7 3.3*    Gastrointestinal Profile: Last BM:12/19   Nutrition-Focused Physical Exam Findings:  Unable to complete Nutrition-Focused physical exam at this time.      Weight Change: Pt reports 5 pound wt loss in the past week and half (2% wt loss in the last week and half)     Diet Order:  Diet NPO time specified  Skin:   reviewed   Height:   Ht Readings from Last 1 Encounters:  06/29/15 5\' 6"  (1.676 m)    Weight:   Wt Readings from Last 1 Encounters:  06/29/15  230 lb 14.4 oz (104.736 kg)    Ideal Body Weight:     BMI:  Body mass index is 37.29 kg/(m^2).  Estimated Nutritional Needs:   Kcal:  BEE 1266 kcals (IF 1.0-1.3, AF 1.3) [id number] kcals/d  Protein:  (1.0-1.2 g/kg) 59-71 g/d  Fluid:  (25-89ml/kg) 1475-1749ml/d  EDUCATION NEEDS:   No education needs identified at this time  [place]. Zenia Resides, [place], [place] (pager) Weekend/On-Call pager [phone])

## 2015-06-29 NOTE — Progress Notes (Signed)
Patient A/O, administered prn pain regimen, see EMAR. Patient c/o nausea administered regimen, effective. Patient was upset resulted in she was NPO. Educated on reasoning for NPO. Staff will continue to monitor and meet needs.

## 2015-06-29 NOTE — Progress Notes (Signed)
[facility]   PATIENT NAME: Grace Fox    MR#:  [id number]  DATE OF BIRTH:  1971-04-10  SUBJECTIVE:  She is feeling somewhat better however having some significant upper abdominal pain and back pain. HIDA scan was read as negative. There is filling of the gallbladder. She is hungry and thirsty.  REVIEW OF SYSTEMS:   Review of Systems  Constitutional: Positive for fever and chills. Negative for weight loss.  Cardiovascular: Negative for chest pain.  Gastrointestinal: Positive for abdominal pain.  Genitourinary: Negative for urgency.    DRUG ALLERGIES:  No Known Allergies  VITALS:  Blood pressure 132/71, pulse 94, temperature 98.2 F (36.8 C), temperature source Oral, resp. rate 16, height 5\' 6"  (1.676 m), weight 230 lb 14.4 oz (104.736 kg), SpO2 97 %.  I/O last 3 completed shifts: In: 587.5 [I.V.:587.5] Out: 475 [Urine:475] Total I/O In: 775 [I.V.:575; IV Piggyback:200] Out: 400 [Urine:400]   PHYSICAL EXAMINATION:  Physical Exam  Constitutional: She is oriented to person, place, and time and well-developed, well-nourished, and in no distress.  Very poor dentition  Eyes: Pupils are equal, round, and reactive to light.  Cardiovascular: Normal rate.   Pulmonary/Chest: Effort normal. No respiratory distress.  Abdominal: Soft. There is tenderness.  Neurological: She is oriented to person, place, and time.  Skin: Skin is warm and dry. She is not diaphoretic.  Psychiatric: Affect normal.    CBC Latest Ref Rng 06/29/2015 06/28/2015 05/26/2015  WBC 3.6 - 11.0 K/uL 9.2 9.5 11.7(H)  Hemoglobin 12.0 - 16.0 g/dL 10.8(L) 11.3(L) 11.1(L)  Hematocrit 35.0 - 47.0 % 34.7(L) 36.7 35.4  Platelets 150 - 440 K/uL 155 236 298    BMP Latest Ref Rng 06/29/2015 06/28/2015 05/26/2015  Glucose 65 - 99 mg/dL 93 109(H) 119(H)  BUN 6 - 20 mg/dL 14 17 29(H)  Creatinine 0.44 - 1.00 mg/dL 1.31(H) 1.27(H) 1.33(H)  Sodium 135 - 145 mmol/L 136 135 137  Potassium 3.5 - 5.1  mmol/L 3.7 3.3(L) 4.6  Chloride 101 - 111 mmol/L 104 100(L) 107  CO2 22 - 32 mmol/L 23 26 21(L)  Calcium 8.9 - 10.3 mg/dL 7.8(L) 8.5(L) 8.7(L)    HIDA scan was personally reviewed. Read as negative. No signs of cystic duct obstruction.    ASSESSMENT AND PLAN:   Abdominal pain not consistent with acute cholecystitis by history as well as by HIDA scanning.  Clear liquid diet  GI medicine consultation.

## 2015-06-29 NOTE — Progress Notes (Signed)
Patient ID: Grace Fox, female   DOB: June 29, 1971, 44 y.o.   MRN: [id number]   Patient discussed with Dr. Burt Knack this morning. X-rays and films reviewed along with history. Awaiting HIDA scan results for which the patient currently is in the x-ray department.

## 2015-06-29 NOTE — Consult Note (Signed)
GI Inpatient Consult Note  Reason for Consult: abdominal pain    Attending Requesting Consult: Dr. Burt Knack   History of Present Illness: Grace Fox is a 44 y.o. female seen for evaluation of abdominal pain at the request of Dr. Burt Knack. She is a new patient to [facility]. She reports yesterday developing sharp upper abdominal pain radiating to both sides. Began when eating ice which she has constantly craved since November. Pain was stabbing in nature, unlike any pain she has had in past. Denies N/V with the pain. She has rare chronic GERD symptoms. Doesn't take PPI. Does use NSAIDs for migraines about 3x/monthm usually ibuprofen. Denies dysphagia or odynophagia.   BM are chronically irregular but usually having a BM about QOD. Does not use laxatives. No lower abdominal pain, diarrhea, rectal bleeding. Weight stable.   Her abdominal pain has improved since admission. Now having more back pain from uncomfortable bed. No N/V. No BM since admission.    Last Colonoscopy: None prior  Last Endoscopy: None prior   Past Medical History:  Past Medical History  Diagnosis Date  . Pneumonia   . Hypertension   . Headache     Problem List: Patient Active Problem List   Diagnosis Date Noted  . Acute cholecystitis 06/28/2015  . Allergic reaction 05/26/2015    Past Surgical History: History reviewed. No pertinent past surgical history.  Allergies: No Known Allergies  Home Medications: Prescriptions prior to admission  Medication Sig Dispense Refill Last Dose  . albuterol (PROVENTIL HFA;VENTOLIN HFA) 108 (90 BASE) MCG/ACT inhaler Inhale 2 puffs into the lungs every 4 (four) hours as needed for wheezing or shortness of breath.     . budesonide-formoterol (SYMBICORT) 160-4.5 MCG/ACT inhaler Inhale 2 puffs into the lungs 2 (two) times daily.   06/28/2015 at 0600  . EPINEPHrine 0.3 mg/0.3 mL IJ SOAJ injection Inject 0.3 mLs (0.3 mg total) into the muscle once. Follow package instructions as  needed for severe allergy or anaphylactic reaction. 1 Device 2 prn at prn  . furosemide (LASIX) 40 MG tablet Take 40 mg by mouth daily.   06/28/2015 at 0600  . metoprolol succinate (TOPROL-XL) 50 MG 24 hr tablet Take 50 mg by mouth daily. Take with or immediately following a meal.   06/28/2015 at 0600  . potassium chloride (K-DUR,KLOR-CON) 10 MEQ tablet Take 10 mEq by mouth daily.   06/28/2015 at 0600  . diphenhydrAMINE (BENADRYL) 25 mg capsule Take 1 capsule (25 mg total) by mouth every 6 (six) hours. For 2-3 days and then as needed 30 capsule 0   . predniSONE (DELTASONE) 10 MG tablet Take 5 tablets (50 mg total) by mouth daily with breakfast. Taper by 10 mg daily then stop 15 tablet 0   . ranitidine (ZANTAC 75) 75 MG tablet Take 1 tablet (75 mg total) by mouth 2 (two) times daily. 14 tablet 0    Home medication reconciliation was completed with the patient.   Scheduled Inpatient Medications:   . ampicillin-sulbactam (UNASYN) IV  3 g Intravenous Q6H  . antiseptic oral rinse  7 mL Mouth Rinse BID  . heparin  5,000 Units Subcutaneous 3 times per day  . metoprolol succinate  25 mg Oral Daily    Continuous Inpatient Infusions:   . lactated ringers Stopped (06/29/15 1334)    PRN Inpatient Medications:  morphine injection, ondansetron **OR** ondansetron (ZOFRAN) IV  Family History: family history includes Diabetes in her other; Heart failure in her other.    Social History:  reports that she has been smoking Cigarettes.  She has been smoking about 1.00 pack per day. She does not have any smokeless tobacco history on file. She reports that she does not drink alcohol or use illicit drugs. The patient denies ETOH, tobacco, or drug use.   Review of Systems: Constitutional: Weight is stable.  Eyes: No changes in vision. ENT: No oral lesions, sore throat.  GI: see HPI.  Heme/Lymph: No easy bruising.  CV: No chest pain.  GU: No hematuria.  Integumentary: No rashes.  Neuro: No headaches.   Psych: No depression/anxiety.  Endocrine: No heat/cold intolerance.  Allergic/Immunologic: No urticaria.  Resp: No cough, SOB.  Musculoskeletal: No joint swelling.    Physical Examination: BP 121/61 mmHg  Pulse 111  Temp(Src) 99.6 F (37.6 C) (Oral)  Resp 24  Ht 5\' 6"  (1.676 m)  Wt 230 lb 14.4 oz (104.736 kg)  BMI 37.29 kg/m2  SpO2 97%  LMP  (LMP Unknown) Gen: NAD, alert and oriented x 4 HEENT: PEERLA, EOMI. Poor dentition.  Neck: supple, no JVD or thyromegaly Chest: CTA bilaterally, no wheezes, crackles, or other adventitious sounds CV: RRR, no m/g/c/r Abd: soft, minimally TTP in epigastric area, ND, +BS in all four quadrants; no HSM, guarding, ridigity, or rebound tenderness Ext: no edema, well perfused with 2+ pulses Skin: no rash or lesions noted Lymph: no LAD  Data: Lab Results  Component Value Date   WBC 9.2 06/29/2015   HGB 10.8* 06/29/2015   HCT 34.7* 06/29/2015   MCV 80.0 06/29/2015   PLT 155 06/29/2015    Recent Labs Lab 06/28/15 1648 06/29/15 0421  HGB 11.3* 10.8*   Lab Results  Component Value Date   NA 136 06/29/2015   K 3.7 06/29/2015   CL 104 06/29/2015   CO2 23 06/29/2015   BUN 14 06/29/2015   CREATININE 1.31* 06/29/2015   Lab Results  Component Value Date   ALT 17 06/29/2015   AST 18 06/29/2015   ALKPHOS 79 06/29/2015   BILITOT 0.9 06/29/2015   No results for input(s): APTT, INR, PTT in the last 168 hours.   RUQ US IMPRESSION: Cholelithiasis. Areas of mild gallbladder wall thickening with minimal pericholecystic fluid. These findings are concerning for early acute cholecystitis. Study otherwise unremarkable.   HIDA Scan- IMPRESSION: No evidence of cystic duct or common bile duct obstruction.  Assessment/Plan: Grace Fox is a 44 y.o. female   1. Abdominal pain - RUQ [place] w/ questionable cholecystitis but normal HIDA. Normal lipase and LFTs. TTP in RUQ has resolved. Differential also includes PUD, gastritis (occasional NSAIDs w/o  PPI). She declines EGD for evaluation. Will start her on a PPI and clear liquid diet.   2. Anemia/PICA - mild anemia, microcytic. Does complain of significant PICA. Will check iron studies. Menses are irregular but not overal heavy, LMP 04/2015. No obvious bleeding in stools. Declines endoscopic evaluation.     Recommendations:  1. PPI BID 2. Clear liquid diet 3. Iron studies  Case dicussed w/ Dr. Candace Cruise.   Thank you for the consult. Please call with questions or concerns.  Ronney Asters, PA-C [place]

## 2015-06-29 NOTE — Consult Note (Signed)
  Pt seen and examined. Please see J. Celesta Aver' notes. Pt with acute RUQ pain with nausea and fever. Placed on Abx and pain meds. Initial U/S showed gallstones and possible cholecystitis. However, HIDA scan is neg. Pt feels better. Less abdominal pain. Only minimal abdominal tenderness at this time.Pt does admitted to having nausea, GERD- like symptoms, for which she was briefly on GI meds in the past. Discussed scheduling EGD with pt. Pt absolutely refused. Agree with starting liquid diet. Would treat pt with PPI BID in case her symptoms are GERD/PUD related. Will sign off. Call us back if situation changes. Thanks.

## 2015-06-30 DIAGNOSIS — E440 Moderate protein-calorie malnutrition: Secondary | ICD-10-CM | POA: Insufficient documentation

## 2015-06-30 LAB — URINE CULTURE

## 2015-06-30 NOTE — Discharge Instructions (Signed)
Abdominal Pain, Adult °Many things can cause abdominal pain. Usually, abdominal pain is not caused by a disease and will improve without treatment. It can often be observed and treated at home. Your health care provider will do a physical exam and possibly order blood tests and X-rays to help determine the seriousness of your pain. However, in many cases, more time must pass before a clear cause of the pain can be found. Before that point, your health care provider may not know if you need more testing or further treatment. °HOME CARE INSTRUCTIONS °Monitor your abdominal pain for any changes. The following actions may help to alleviate any discomfort you are experiencing: °· Only take over-the-counter or prescription medicines as directed by your health care provider. °· Do not take laxatives unless directed to do so by your health care provider. °· Try a clear liquid diet (broth, tea, or water) as directed by your health care provider. Slowly move to a bland diet as tolerated. °SEEK MEDICAL CARE IF: °· You have unexplained abdominal pain. °· You have abdominal pain associated with nausea or diarrhea. °· You have pain when you urinate or have a bowel movement. °· You experience abdominal pain that wakes you in the night. °· You have abdominal pain that is worsened or improved by eating food. °· You have abdominal pain that is worsened with eating fatty foods. °· You have a fever. °SEEK IMMEDIATE MEDICAL CARE IF: °· Your pain does not go away within 2 hours. °· You keep throwing up (vomiting). °· Your pain is felt only in portions of the abdomen, such as the right side or the left lower portion of the abdomen. °· You pass bloody or black tarry stools. °MAKE SURE YOU: °· Understand these instructions. °· Will watch your condition. °· Will get help right away if you are not doing well or get worse. °  °This information is not intended to replace advice given to you by your health care provider. Make sure you discuss  any questions you have with your health care provider. °  °Document Released: 04/04/2005 Document Revised: 03/16/2015 Document Reviewed: 03/04/2013 °Elsevier Interactive Patient Education ©2016 [employer] ° ° °Abdominal Pain, Adult °Many things can cause belly (abdominal) pain. Most times, the belly pain is not dangerous. Many cases of belly pain can be watched and treated at home. °HOME CARE  °· Do not take medicines that help you go poop (laxatives) unless told to by your doctor. °· Only take medicine as told by your doctor. °· Eat or drink as told by your doctor. Your doctor will tell you if you should be on a special diet. °GET HELP IF: °· You do not know what is causing your belly pain. °· You have belly pain while you are sick to your stomach (nauseous) or have runny poop (diarrhea). °· You have pain while you pee or poop. °· Your belly pain wakes you up at night. °· You have belly pain that gets worse or better when you eat. °· You have belly pain that gets worse when you eat fatty foods. °· You have a fever. °GET HELP RIGHT AWAY IF:  °· The pain does not go away within 2 hours. °· You keep throwing up (vomiting). °· The pain changes and is only in the right or left part of the belly. °· You have bloody or tarry looking poop. °MAKE SURE YOU:  °· Understand these instructions. °· Will watch your condition. °· Will get help right away   if you are not doing well or get worse. °  °This information is not intended to replace advice given to you by your health care provider. Make sure you discuss any questions you have with your health care provider. °  °Document Released: 12/12/2007 Document Revised: 07/16/2014 Document Reviewed: 03/04/2013 °Elsevier Interactive Patient Education ©2016 Elsevier Inc. ° °

## 2015-06-30 NOTE — Discharge Summary (Signed)
Physician Discharge Summary  Patient ID: Grace Fox MRN: [id number] DOB/AGE: 42-Dec-1972 42 y.o.  Admit date: 06/28/2015 Discharge date: 06/30/2015  Admission Diagnoses: Abdominal pain, cholelithiasis Discharge Diagnoses:  Same   Hospital Course: The patient was admitted with an unusual story for acute calculus cholecystitis. A HIDA scan was performed and found to be unremarkable. Gastroenterology was consulted. Recommended EGD however the patient views having the procedure done. She was placed on twice a day PPI her diet was able to be advanced and the following morning the patient was stable and improved for discharge with no abdominal pain present.  Consults: Gastroenterology  Significant Diagnostic Studies: HIDA scan.   Discharge Exam: Blood pressure 139/71, pulse 91, temperature 98.3 F (36.8 C), temperature source Oral, resp. rate 19, height 5\' 6"  (1.676 m), weight 230 lb 14.4 oz (104.736 kg), SpO2 94 %. Soft nontender abdomen very poor dentition.  Disposition: 01-Home or Self Care  Discharge Instructions    Call MD for:  persistant nausea and vomiting    Complete by:  As directed      Call MD for:  severe uncontrolled pain    Complete by:  As directed      Call MD for:  temperature >100.4    Complete by:  As directed      Diet general    Complete by:  As directed      Discharge instructions    Complete by:  As directed   Call or return for recurrent symptoms.     Increase activity slowly    Complete by:  As directed             Medication List    STOP taking these medications        predniSONE 10 MG tablet  Commonly known as:  DELTASONE      TAKE these medications        albuterol 108 (90 BASE) MCG/ACT inhaler  Commonly known as:  PROVENTIL HFA;VENTOLIN HFA  Inhale 2 puffs into the lungs every 4 (four) hours as needed for wheezing or shortness of breath.     budesonide-formoterol 160-4.5 MCG/ACT inhaler  Commonly known as:  SYMBICORT  Inhale  2 puffs into the lungs 2 (two) times daily.     diphenhydrAMINE 25 mg capsule  Commonly known as:  BENADRYL  Take 1 capsule (25 mg total) by mouth every 6 (six) hours. For 2-3 days and then as needed     EPINEPHrine 0.3 mg/0.3 mL Soaj injection  Commonly known as:  EPI-PEN  Inject 0.3 mLs (0.3 mg total) into the muscle once. Follow package instructions as needed for severe allergy or anaphylactic reaction.     furosemide 40 MG tablet  Commonly known as:  LASIX  Take 40 mg by mouth daily.     metoprolol succinate 50 MG 24 hr tablet  Commonly known as:  TOPROL-XL  Take 50 mg by mouth daily. Take with or immediately following a meal.     potassium chloride 10 MEQ tablet  Commonly known as:  K-DUR,KLOR-CON  Take 10 mEq by mouth daily.     ranitidine 75 MG tablet  Commonly known as:  ZANTAC 75  Take 1 tablet (75 mg total) by mouth 2 (two) times daily.           Follow-up Information    Follow up with Sherri Rad, MD In 1 week.   Specialties:  Surgery, Radiology   Why:  As needed   Contact information:  [address] [place] [place] [address] [phone]       Signed: Sherri Rad 06/30/2015, 8:44 AM

## 2015-06-30 NOTE — Final Progress Note (Signed)
Surgery  The patient denies pain. She wants to go home. She did not want an EGD. She was seen by GI medicine. She will be discharged on 75 mg of Zantac twice a day. Exam was deferred this morning. I'll see her back in the office in one week's time.

## 2015-06-30 NOTE — Progress Notes (Signed)
06/30/2015 11:36 AM  BP 139/71 mmHg  Pulse 88  Temp(Src) 98.3 F (36.8 C) (Oral)  Resp 19  Ht 5\' 6"  (1.676 m)  Wt 104.736 kg (230 lb 14.4 oz)  BMI 37.29 kg/m2  SpO2 91%  LMP  (LMP Unknown) Patient discharged per MD orders. Discharge instructions reviewed with patient and patient verbalized understanding. IV removed per policy.  Discharged via wheelchair escorted by volunteer.  Almedia Balls, RN

## 2015-06-30 NOTE — Progress Notes (Signed)
Patient A/O no noted distress. According to monitor patient stop breathing (asystole), when in patient's room awaken her and the monitor resume. Patient was at 45 degree. Patient may have Sleep apnea Educated patient smoking cessation. Staff will continue to monitor and meet needs. Patient tolerated meds.

## 2015-07-03 LAB — CULTURE, BLOOD (ROUTINE X 2)
Culture: NO GROWTH
Culture: NO GROWTH

## 2015-07-05 ENCOUNTER — Other Ambulatory Visit: Payer: Medicaid Other

## 2015-08-08 ENCOUNTER — Encounter: Payer: Self-pay | Admitting: Family Medicine

## 2015-08-18 ENCOUNTER — Encounter: Payer: Medicaid Other | Admitting: Obstetrics and Gynecology

## 2015-08-24 ENCOUNTER — Encounter: Payer: Medicaid Other | Admitting: Obstetrics and Gynecology

## 2016-11-23 ENCOUNTER — Other Ambulatory Visit: Payer: Self-pay | Admitting: *Deleted

## 2016-11-23 ENCOUNTER — Inpatient Hospital Stay
Admission: RE | Admit: 2016-11-23 | Discharge: 2016-11-23 | Disposition: A | Discharge status: Home / Self Care | Payer: Self-pay | Source: Ambulatory Visit | Attending: *Deleted | Admitting: *Deleted

## 2016-11-23 DIAGNOSIS — Z9289 Personal history of other medical treatment: Secondary | ICD-10-CM

## 2016-11-26 ENCOUNTER — Other Ambulatory Visit: Payer: Self-pay | Admitting: Family Medicine

## 2016-11-26 DIAGNOSIS — R928 Other abnormal and inconclusive findings on diagnostic imaging of breast: Secondary | ICD-10-CM

## 2016-12-21 ENCOUNTER — Other Ambulatory Visit: Payer: Medicaid Other

## 2016-12-21 ENCOUNTER — Ambulatory Visit: Payer: Medicaid Other

## 2016-12-27 ENCOUNTER — Other Ambulatory Visit: Payer: Medicaid Other

## 2016-12-27 ENCOUNTER — Ambulatory Visit: Payer: Medicaid Other | Attending: Family Medicine

## 2017-02-21 ENCOUNTER — Encounter: Payer: Self-pay | Admitting: Obstetrics and Gynecology

## 2017-05-14 ENCOUNTER — Encounter: Payer: Medicaid Other | Admitting: Obstetrics and Gynecology

## 2017-05-15 ENCOUNTER — Ambulatory Visit: Payer: Medicaid Other | Admitting: Obstetrics and Gynecology

## 2017-05-15 ENCOUNTER — Encounter: Payer: Self-pay | Admitting: Obstetrics and Gynecology

## 2017-05-15 VITALS — BP 117/68 | HR 83 | Ht 66.0 in | Wt 221.4 lb

## 2017-05-15 DIAGNOSIS — R87611 Atypical squamous cells cannot exclude high grade squamous intraepithelial lesion on cytologic smear of cervix (ASC-H): Secondary | ICD-10-CM | POA: Diagnosis not present

## 2017-05-15 DIAGNOSIS — B977 Papillomavirus as the cause of diseases classified elsewhere: Secondary | ICD-10-CM

## 2017-05-15 DIAGNOSIS — N72 Inflammatory disease of cervix uteri: Secondary | ICD-10-CM | POA: Diagnosis not present

## 2017-05-15 NOTE — Progress Notes (Signed)
HPI:  Grace Fox is a 46 y.o.  [phone]  who presents today for evaluation and management of abnormal cervical cytology.   Patient does smoke cigarettes daily.  Dysplasia History:    Patient presents with a history of ASCUS high with positive HPV.  She reports no previous abnormals prior to 2018.  ROS:  Pertinent items are noted in HPI.  OB History  Gravida Para Term Preterm AB Living  6 6 5 1   6   SAB TAB Ectopic Multiple Live Births          6    # Outcome Date GA Lbr Len/2nd Weight Sex Delivery Anes PTL Lv  6 Preterm 2009   5 lb 13 oz (2.637 kg) F Vag-Spont  Y LIV  5 Term 2005   8 lb 3 oz (3.714 kg) F Vag-Spont  N LIV  4 Term 1998   8 lb 15 oz (4.054 kg) F Vag-Spont  Y LIV  3 Term 1997   8 lb 13 oz (3.997 kg) F Vag-Spont  Y LIV  2 Term 1995   8 lb 3 oz (3.714 kg) M Vag-Spont  Y LIV  1 Term 1993   7 lb 1.5 oz (3.218 kg) F Vag-Spont  Y LIV     Complications: Pre-eclampsia      Past Medical History:  Diagnosis Date  . Anxiety   . Aortic regurgitation   . Depression   . Gall stones   . Headache   . Hypertension   . Kidney atrophy   . Pneumonia   . Thyroid disease     History reviewed. No pertinent surgical history.  SOCIAL HISTORY: Social History   Substance and Sexual Activity  Alcohol Use No   Social History   Substance and Sexual Activity  Drug Use No     Family History  Problem Relation Age of Onset  . Diabetes Other   . Heart failure Other   . Thyroid disease Mother   . Cancer Father   . Hypertension Maternal Grandfather   . Stroke Maternal Grandfather   . Cancer Paternal Grandmother     ALLERGIES:  Patient has no known allergies.  She has a current medication list which includes the following prescription(s): albuterol, budesonide-formoterol, carbonyl iron, carvedilol, clonazepam, furosemide, levothyroxine, potassium chloride, ranitidine, simvastatin, spironolactone, and vitamin d (ergocalciferol).  Physical Exam: -Vitals:  BP  117/68   Pulse 83   Ht 5\' 6"  (1.676 m)   Wt 221 lb 6 oz (100.4 kg)   LMP 05/08/2017 (Exact Date)   BMI 35.73 kg/m  GEN: WD, WN, NAD.  A+ O x 3, good mood and affect. ABD:  NT, ND.  Soft, no masses.  No hernias noted.   Pelvic:   Vulva: Normal appearance.  No lesions.  Vagina: No lesions or abnormalities noted.  Support: Normal pelvic support.  Urethra No masses tenderness or scarring.  Meatus Normal size without lesions or prolapse.  Cervix: See below.  Anus: Normal exam.  No lesions.  Perineum: Normal exam.  No lesions.        Bimanual   Uterus: Normal size.  Non-tender.  Mobile.  AV.  Adnexae: No masses.  Non-tender to palpation.  Cul-de-sac: Negative for abnormality.   PROCEDURE: 1.  Urine Pregnancy Test:  not done 2.  Colposcopy performed with 4% acetic acid after verbal consent obtained                           -  Aceto-white Lesions Location(s): 12 and 5 o'clock.              -Biopsy performed at 12 and 5 o'clock               -ECC indicated and performed: No.     -Biopsy sites made hemostatic with pressure and Monsel's solution   -Satisfactory colposcopy: Yes.      -Evidence of Invasive cervical CA :  NO  ASSESSMENT:  Grace Fox is a 46 y.o. [phone] here for  1. Atypical squamous cells cannot exclude high grade squamous intraepithelial lesion on cytologic smear of cervix (ASC-H)   2. High risk human papilloma virus (HPV) infection of cervix   .  PLAN: 1.  I discussed the grading system of pap smears and HPV high risk viral types.  We will discuss management after colpo results return.  No orders of the defined types were placed in this encounter.          F/U  Return in about 1 week (around 05/22/2017). I spent 22 minutes with this patient of which greater than 50% was spent discussing abnormal Pap smears, colposcopy, possible outcomes treatments and follow-ups.  All of her questions were answered.  Jeannie Fend ,MD 05/15/2017,12:02 PM

## 2017-05-17 LAB — PATHOLOGY

## 2017-05-22 ENCOUNTER — Encounter: Payer: Self-pay | Admitting: Obstetrics and Gynecology

## 2017-05-22 ENCOUNTER — Ambulatory Visit: Payer: Medicaid Other | Admitting: Obstetrics and Gynecology

## 2017-05-22 VITALS — BP 128/83 | HR 87 | Ht 66.0 in | Wt 222.3 lb

## 2017-05-22 DIAGNOSIS — N871 Moderate cervical dysplasia: Secondary | ICD-10-CM

## 2017-05-22 NOTE — Progress Notes (Signed)
HPI:      [name] is a 46 y.o. [phone] who LMP was Patient's last menstrual period was 05/08/2017 (exact date).  Subjective:   She presents today for follow-up of her colposcopy.    Hx: The following portions of the patient's history were reviewed and updated as appropriate:             She  has a past medical history of Anxiety, Aortic regurgitation, Depression, Gall stones, Headache, Hypertension, Kidney atrophy, Pneumonia, and Thyroid disease. She does not have any pertinent problems on file. She  has no past surgical history on file. Her family history includes Cancer in her father and paternal grandmother; Diabetes in her other; Heart failure in her other; Hypertension in her maternal grandfather; Stroke in her maternal grandfather; Thyroid disease in her mother. She  reports that she has been smoking cigarettes.  She has been smoking about 1.00 pack per day. she has never used smokeless tobacco. She reports that she does not drink alcohol or use drugs. She has No Known Allergies.       Review of Systems:  Review of Systems  Constitutional: Denied constitutional symptoms, night sweats, recent illness, fatigue, fever, insomnia and weight loss.  Eyes: Denied eye symptoms, eye pain, photophobia, vision change and visual disturbance.  Ears/Nose/Throat/Neck: Denied ear, nose, throat or neck symptoms, hearing loss, nasal discharge, sinus congestion and sore throat.  Cardiovascular: Denied cardiovascular symptoms, arrhythmia, chest pain/pressure, edema, exercise intolerance, orthopnea and palpitations.  Respiratory: Denied pulmonary symptoms, asthma, pleuritic pain, productive sputum, cough, dyspnea and wheezing.  Gastrointestinal: Denied, gastro-esophageal reflux, melena, nausea and vomiting.  Genitourinary: Denied genitourinary symptoms including symptomatic vaginal discharge, pelvic relaxation issues, and urinary complaints.  Musculoskeletal: Denied musculoskeletal  symptoms, stiffness, swelling, muscle weakness and myalgia.  Dermatologic: Denied dermatology symptoms, rash and scar.  Neurologic: Denied neurology symptoms, dizziness, headache, neck pain and syncope.  Psychiatric: Denied psychiatric symptoms, anxiety and depression.  Endocrine: Denied endocrine symptoms including hot flashes and night sweats.   Meds:   Current Outpatient Medications on File Prior to Visit  Medication Sig Dispense Refill  . albuterol (PROVENTIL HFA;VENTOLIN HFA) 108 (90 BASE) MCG/ACT inhaler Inhale 2 puffs into the lungs every 4 (four) hours as needed for wheezing or shortness of breath.    . budesonide-formoterol (SYMBICORT) 160-4.5 MCG/ACT inhaler Inhale 2 puffs into the lungs 2 (two) times daily.    . carbonyl iron (FEOSOL) 45 MG TABS tablet Take by mouth.    . carvedilol (COREG) 25 MG tablet Take 25 mg by mouth.    . clonazePAM (KLONOPIN) 0.5 MG tablet Take 0.5 mg 2 (two) times daily as needed by mouth for anxiety.    . furosemide (LASIX) 40 MG tablet Take 40 mg by mouth daily.    Marland Kitchen levothyroxine (SYNTHROID, LEVOTHROID) 25 MCG tablet Take 25 mcg daily before breakfast by mouth.    . potassium chloride (K-DUR,KLOR-CON) 10 MEQ tablet Take 10 mEq by mouth daily.    . ranitidine (ZANTAC 75) 75 MG tablet Take 1 tablet (75 mg total) by mouth 2 (two) times daily. 14 tablet 0  . simvastatin (ZOCOR) 40 MG tablet Take 40 mg by mouth.    . spironolactone (ALDACTONE) 25 MG tablet Take 25 mg by mouth.    . Vitamin D, Ergocalciferol, (DRISDOL) 50000 units CAPS capsule Take by mouth.     No current facility-administered medications on file prior to visit.     Objective:     Vitals:  05/22/17 0827  BP: 128/83  Pulse: 87              Colposcopy results reviewed in detail with the patient-revealing CIN-2.  Assessment:    [id number] Patient Active Problem List   Diagnosis Date Noted  . Malnutrition of moderate degree 06/30/2015  . RUQ abdominal pain   . Acute  cholecystitis 06/28/2015  . Allergic reaction 05/26/2015     1. Dysplasia of cervix, high grade CIN 2     Completion of childbearing   Plan:            1.  Option of follow-up colposcopy versus LEEP discussed.  Risks and benefits of each and detailed follow-up discussed.  Cancer risk of CIN reviewed.  Discussed smoking cessation and its relationship to HPV. She has elected to undergo follow-up colposcopy in 6 months.  She has stated that if it is still CIN-2 at that time she would like a LEEP.  Orders No orders of the defined types were placed in this encounter.   No orders of the defined types were placed in this encounter.     F/U  Return in about 6 months (around 11/19/2017). I spent 17 minutes with this patient of which greater than 50% was spent discussing the natural course and history of abnormal Pap smears and HPV.  LEEP versus expectant management.  Smoking cessation.  Review of colposcopy findings.  Finis Bud, M.D. 05/22/2017 9:24 AM

## 2017-09-06 ENCOUNTER — Other Ambulatory Visit: Payer: Self-pay

## 2017-09-21 ENCOUNTER — Encounter: Payer: Self-pay | Admitting: Emergency Medicine

## 2017-09-21 ENCOUNTER — Emergency Department
Admission: EM | Admit: 2017-09-21 | Discharge: 2017-09-21 | Disposition: A | Discharge status: Home / Self Care | Payer: Medicaid Other | Attending: Emergency Medicine | Admitting: Emergency Medicine

## 2017-09-21 ENCOUNTER — Emergency Department: Payer: Medicaid Other

## 2017-09-21 ENCOUNTER — Other Ambulatory Visit: Payer: Self-pay

## 2017-09-21 DIAGNOSIS — I10 Essential (primary) hypertension: Secondary | ICD-10-CM | POA: Insufficient documentation

## 2017-09-21 DIAGNOSIS — R42 Dizziness and giddiness: Secondary | ICD-10-CM

## 2017-09-21 DIAGNOSIS — Z79899 Other long term (current) drug therapy: Secondary | ICD-10-CM | POA: Diagnosis not present

## 2017-09-21 DIAGNOSIS — F17210 Nicotine dependence, cigarettes, uncomplicated: Secondary | ICD-10-CM | POA: Insufficient documentation

## 2017-09-21 DIAGNOSIS — R0789 Other chest pain: Secondary | ICD-10-CM

## 2017-09-21 LAB — CBC
HEMATOCRIT: 37.6 % (ref 35.0–47.0)
HEMOGLOBIN: 12.7 g/dL (ref 12.0–16.0)
MCH: 31.6 pg (ref 26.0–34.0)
MCHC: 33.9 g/dL (ref 32.0–36.0)
MCV: 93.2 fL (ref 80.0–100.0)
Platelets: 263 10*3/uL (ref 150–440)
RBC: 4.04 MIL/uL (ref 3.80–5.20)
RDW: 13.9 % (ref 11.5–14.5)
WBC: 9 10*3/uL (ref 3.6–11.0)

## 2017-09-21 LAB — BASIC METABOLIC PANEL
ANION GAP: 9 (ref 5–15)
BUN: 20 mg/dL (ref 6–20)
CALCIUM: 9.2 mg/dL (ref 8.9–10.3)
CO2: 25 mmol/L (ref 22–32)
Chloride: 107 mmol/L (ref 101–111)
Creatinine, Ser: 1.64 mg/dL — ABNORMAL HIGH (ref 0.44–1.00)
GFR, EST AFRICAN AMERICAN: 43 mL/min — AB (ref >60)
GFR, EST NON AFRICAN AMERICAN: 37 mL/min — AB (ref >60)
Glucose, Bld: 124 mg/dL — ABNORMAL HIGH (ref 65–99)
POTASSIUM: 3.8 mmol/L (ref 3.5–5.1)
Sodium: 141 mmol/L (ref 135–145)

## 2017-09-21 LAB — TROPONIN I

## 2017-09-21 LAB — POCT PREGNANCY, URINE: Preg Test, Ur: NEGATIVE

## 2017-09-21 MED ORDER — IPRATROPIUM-ALBUTEROL 0.5-2.5 (3) MG/3ML IN SOLN: 3.0000 mL | Freq: Once | RESPIRATORY_TRACT | Status: DC

## 2017-09-21 NOTE — Discharge Instructions (Signed)
Please seek medical attention for any high fevers, chest pain, shortness of breath, change in behavior, persistent vomiting, bloody stool or any other new or concerning symptoms.  

## 2017-09-21 NOTE — ED Provider Notes (Signed)
[facility] Emergency Department Provider Note   ____________________________________________   I have reviewed the triage vital signs and the nursing notes.   HISTORY  Chief Complaint Chest Pain; Dizziness; and Shortness of Breath   History limited by: Not Limited   HPI Grace Fox is a 47 y.o. female who presents to the emergency department today because of concerns for chest pain.  It is located in the left upper chest.  It started today.  The patient states she was sitting down when it started.  The patient states she has had similar pain in the past and that has been related to some anxiety.  Patient states she is slightly stressed out because she does have a aortic regurg and there are now talked about possibly replacing it.  By the time my examination the pain had improved.  Per medical record review patient has a history of aortic regurgitation.  Past Medical History:  Diagnosis Date  . Anxiety   . Aortic regurgitation   . Depression   . Gall stones   . Headache   . Hypertension   . Kidney atrophy   . Pneumonia   . Thyroid disease     Patient Active Problem List   Diagnosis Date Noted  . Malnutrition of moderate degree 06/30/2015  . RUQ abdominal pain   . Acute cholecystitis 06/28/2015  . Allergic reaction 05/26/2015    Past Surgical History:  Procedure Laterality Date  . CHOLECYSTECTOMY      Prior to Admission medications   Medication Sig Start Date End Date Taking? Authorizing Provider  albuterol (PROVENTIL HFA;VENTOLIN HFA) 108 (90 BASE) MCG/ACT inhaler Inhale 2 puffs into the lungs every 4 (four) hours as needed for wheezing or shortness of breath.    [provider]  budesonide-formoterol (SYMBICORT) 160-4.5 MCG/ACT inhaler Inhale 2 puffs into the lungs 2 (two) times daily.    [provider]  carbonyl iron (FEOSOL) 45 MG TABS tablet Take by mouth.    [provider]  carvedilol (COREG) 25 MG  tablet Take 25 mg by mouth.    [provider]  clonazePAM (KLONOPIN) 0.5 MG tablet Take 0.5 mg 2 (two) times daily as needed by mouth for anxiety.    [provider]  furosemide (LASIX) 40 MG tablet Take 40 mg by mouth daily.    [provider]  levothyroxine (SYNTHROID, LEVOTHROID) 25 MCG tablet Take 25 mcg daily before breakfast by mouth.    [provider]  potassium chloride (K-DUR,KLOR-CON) 10 MEQ tablet Take 10 mEq by mouth daily.    [provider]  ranitidine (ZANTAC 75) 75 MG tablet Take 1 tablet (75 mg total) by mouth 2 (two) times daily. 05/27/15   Fritzi Mandes, MD  simvastatin (ZOCOR) 40 MG tablet Take 40 mg by mouth.    [provider]  spironolactone (ALDACTONE) 25 MG tablet Take 25 mg by mouth.    [provider]  Vitamin D, Ergocalciferol, (DRISDOL) 50000 units CAPS capsule Take by mouth.    [provider]    Allergies Patient has no known allergies.  Family History  Problem Relation Age of Onset  . Diabetes Other   . Heart failure Other   . Thyroid disease Mother   . Cancer Father   . Hypertension Maternal Grandfather   . Stroke Maternal Grandfather   . Cancer Paternal Grandmother     Social History Social History   Tobacco Use  . Smoking status: Current Every Day  Smoker    Packs/day: 1.00    Types: Cigarettes  . Smokeless tobacco: Never Used  Substance Use Topics  . Alcohol use: No  . Drug use: No    Review of Systems Constitutional: No fever/chills Eyes: No visual changes. ENT: No sore throat. Cardiovascular: Positive for chest pain. Respiratory: Denies shortness of breath. Gastrointestinal: No abdominal pain.  No nausea, no vomiting.  No diarrhea.   Genitourinary: Negative for dysuria. Musculoskeletal: Negative for back pain. Skin: Negative for rash. Neurological: Positive for dizziness. ____________________________________________   PHYSICAL EXAM:  VITAL SIGNS: ED  Triage Vitals  Enc Vitals Group     BP 09/21/17 1531 129/68     Pulse Rate 09/21/17 1531 87     Resp 09/21/17 1531 16     Temp 09/21/17 1531 98 F (36.7 C)     Temp Source 09/21/17 1531 Oral     SpO2 09/21/17 1531 97 %     Weight --      Height --      Head Circumference --      Peak Flow --      Pain Score 09/21/17 1527 7   Constitutional: Alert and oriented. Well appearing and in no distress. Eyes: Conjunctivae are normal.  ENT   Head: Normocephalic and atraumatic.   Nose: No congestion/rhinnorhea.   Mouth/Throat: Mucous membranes are moist.   Neck: No stridor. Hematological/Lymphatic/Immunilogical: No cervical lymphadenopathy. Cardiovascular: Normal rate, regular rhythm.  No murmurs, rubs, or gallops.  Respiratory: Normal respiratory effort without tachypnea nor retractions. Breath sounds are clear and equal bilaterally. No wheezes/rales/rhonchi. Gastrointestinal: Soft and non tender. No rebound. No guarding.  Genitourinary: Deferred Musculoskeletal: Normal range of motion in all extremities. No lower extremity edema. Neurologic:  Normal speech and language. No gross focal neurologic deficits are appreciated.  Skin:  Skin is warm, dry and intact. No rash noted. Psychiatric: Mood and affect are normal. Speech and behavior are normal. Patient exhibits appropriate insight and judgment.  ____________________________________________    LABS (pertinent positives/negatives)  Trop <0.03 CBC wnl BMP glu 124, cr 1.64  ____________________________________________   EKG  I, Nance Pear, attending physician, personally viewed and interpreted this EKG  EKG Time: 1529 Rate: 88 Rhythm: normal sinus rhythm Axis: normal Intervals: qtc 462 QRS: narrow, q waves v1 ST changes: no st elevation Impression: abnormal ekg  ____________________________________________    RADIOLOGY  CXR No acute  disease  ____________________________________________   PROCEDURES  Procedures  ____________________________________________   INITIAL IMPRESSION / ASSESSMENT AND PLAN / ED COURSE  Pertinent labs & imaging results that were available during my care of the patient were reviewed by me and considered in my medical decision making (see chart for details).  Patient presented to the emergency department today because of concerns for chest pain.  Differential would be broad including pneumothorax, pneumonia, PE, ACS, dissection, esophagitis, anxiety amongst other etiologies.  2 sets of troponin were negative.  At this point I doubt dissection or PE.  Chest x-ray without pneumonia or pneumothorax.  Patient continued to feel improved here in the emergency department.  This point I do think it is safe for patient be discharged home without any further emergent workup or management.  Did discuss with patient return precautions and importance of primary care follow-up.  ____________________________________________   FINAL CLINICAL IMPRESSION(S) / ED DIAGNOSES  Final diagnoses:  Atypical chest pain  Dizziness     Note: This dictation was prepared with Dragon dictation. Any transcriptional errors that result from this process  are unintentional     Nance Pear, MD 09/21/17 2020

## 2017-09-21 NOTE — ED Triage Notes (Signed)
Pt to ED via POV c/o chest pain and shortness of breath that started about 1.5 hours PTA. Pt states that she has also been having dizziness but states that this has been an ongoing issue. Pt states that she has seen PCP for this and has follow up with them. Pt denies vomiting but states that she is having some nausea. Pt in NAD in triage.

## 2017-09-24 ENCOUNTER — Ambulatory Visit
Admission: RE | Admit: 2017-09-24 | Discharge: 2017-09-24 | Disposition: A | Discharge status: Home / Self Care | Payer: Medicaid Other | Source: Ambulatory Visit | Attending: Family Medicine | Admitting: Family Medicine

## 2017-09-24 DIAGNOSIS — R928 Other abnormal and inconclusive findings on diagnostic imaging of breast: Secondary | ICD-10-CM

## 2017-09-24 DIAGNOSIS — N6312 Unspecified lump in the right breast, upper inner quadrant: Secondary | ICD-10-CM | POA: Insufficient documentation

## 2017-11-19 ENCOUNTER — Encounter: Payer: Self-pay | Admitting: Obstetrics and Gynecology

## 2017-11-19 ENCOUNTER — Ambulatory Visit (INDEPENDENT_AMBULATORY_CARE_PROVIDER_SITE_OTHER): Payer: Medicaid Other | Admitting: Obstetrics and Gynecology

## 2017-11-19 VITALS — BP 99/64 | HR 93 | Ht 66.0 in | Wt 215.9 lb

## 2017-11-19 DIAGNOSIS — N871 Moderate cervical dysplasia: Secondary | ICD-10-CM | POA: Diagnosis not present

## 2017-11-19 LAB — POCT URINE PREGNANCY: PREG TEST UR: NEGATIVE

## 2017-11-19 NOTE — Progress Notes (Signed)
Referring Provider:    HPI:  Grace Fox is a 47 y.o.  [phone]  who presents today for evaluation and management of abnormal cervical cytology.    Dysplasia History: Previous colposcopy revealed CIN-2     This is a six-month follow-up   ROS:  Pertinent items are noted in HPI.patient is also complaining about heavy menstrual bleeding with each cycle.  Her main problem is cramping a few days immediately prior to her first day of bleeding which she says involves passing clots. She has used Depo-Provera before and it has solve this problem.  She is considering other methods of cycle control.  OB History  Gravida Para Term Preterm AB Living  6 6 5 1   6   SAB TAB Ectopic Multiple Live Births          6    # Outcome Date GA Lbr Len/2nd Weight Sex Delivery Anes PTL Lv  6 Preterm 2009   5 lb 13 oz (2.637 kg) F Vag-Spont  Y LIV  5 Term 2005   8 lb 3 oz (3.714 kg) F Vag-Spont  N LIV  4 Term 1998   8 lb 15 oz (4.054 kg) F Vag-Spont  Y LIV  3 Term 1997   8 lb 13 oz (3.997 kg) F Vag-Spont  Y LIV  2 Term 1995   8 lb 3 oz (3.714 kg) M Vag-Spont  Y LIV  1 Term 1993   7 lb 1.5 oz (3.218 kg) F Vag-Spont  Y LIV     Complications: Pre-eclampsia    Past Medical History:  Diagnosis Date  . Anxiety   . Aortic regurgitation   . Depression   . Gall stones   . Headache   . Hypertension   . Kidney atrophy   . Pneumonia   . Thyroid disease     Past Surgical History:  Procedure Laterality Date  . CHOLECYSTECTOMY      SOCIAL HISTORY: Social History   Substance and Sexual Activity  Alcohol Use No   Social History   Substance and Sexual Activity  Drug Use No     Family History  Problem Relation Age of Onset  . Diabetes Other   . Heart failure Other   . Thyroid disease Mother   . Cancer Father   . Hypertension Maternal Grandfather   . Stroke Maternal Grandfather   . Cancer Paternal Grandmother   . Breast cancer Neg Hx     ALLERGIES:  Patient has no known allergies.  She  has a current medication list which includes the following prescription(s): albuterol, budesonide-formoterol, bupropion, carbonyl iron, cetirizine, clonazepam, ezetimibe, furosemide, levothyroxine, meclizine, potassium chloride, ranitidine, simvastatin, spironolactone, and vitamin d (ergocalciferol).  Physical Exam: -Vitals:  BP 99/64   Pulse 93   Ht 5\' 6"  (1.676 m)   Wt 215 lb 14.4 oz (97.9 kg)   LMP 10/31/2017 (Exact Date)   BMI 34.85 kg/m  GEN: WD, WN, NAD.  A+ O x 3, good mood and affect. ABD:  NT, ND.  Soft, no masses.  No hernias noted.   Pelvic:   Vulva: Normal appearance.  No lesions.  Vagina: No lesions or abnormalities noted.  Support: Normal pelvic support.  Urethra No masses tenderness or scarring.  Meatus Normal size without lesions or prolapse.  Cervix: See below.  Anus: Normal exam.  No lesions.  Perineum: Normal exam.  No lesions.        Bimanual   Uterus: Normal size.  Non-tender.  Mobile.  AV.  Adnexae: No masses.  Non-tender to palpation.  Cul-de-sac: Negative for abnormality.   PROCEDURE: 1.  Urine Pregnancy Test:  not done 2.  Colposcopy performed with 4% acetic acid after verbal consent obtained                           -Aceto-white Lesions Location(s): 10-12 and 6-7 o'clock.              -Biopsy performed at 11, 6 o'clock               -ECC indicated and performed: No.     -Biopsy sites made hemostatic with pressure and Monsel's solution   -Satisfactory colposcopy: Yes.      -Evidence of Invasive cervical CA :  NO  ASSESSMENT:  Grace Fox is a 47 y.o. [phone] here for  1. Dysplasia of cervix, high grade CIN 2   .  PLAN: 1.  I discussed the grading system of pap smears and HPV high risk viral types.  We will discuss management after colpo results return. 2.  Patient considering methods of cycle control because of her heavy menstrual bleeding.  IUD versus Depo-Provera versus progesterone pills discussed in detail.   No orders of the  defined types were placed in this encounter.          F/U  Return in about 1 week (around 11/26/2017). I spent 25 minutes involved in the care of this patient of which greater than 50% was spent discussing her heavy menstrual bleeding, colposcopy results, future follow-ups, methods of cycle control.  Jeannie Fend ,MD 11/19/2017,2:39 PM

## 2017-11-21 LAB — PATHOLOGY

## 2017-11-26 ENCOUNTER — Ambulatory Visit (INDEPENDENT_AMBULATORY_CARE_PROVIDER_SITE_OTHER): Payer: Medicaid Other | Admitting: Obstetrics and Gynecology

## 2017-11-26 ENCOUNTER — Encounter: Payer: Medicaid Other | Admitting: Obstetrics and Gynecology

## 2017-11-26 ENCOUNTER — Encounter: Payer: Self-pay | Admitting: Obstetrics and Gynecology

## 2017-11-26 VITALS — BP 122/79 | HR 89 | Ht 66.0 in | Wt 214.3 lb

## 2017-11-26 DIAGNOSIS — N920 Excessive and frequent menstruation with regular cycle: Secondary | ICD-10-CM

## 2017-11-26 DIAGNOSIS — Z30014 Encounter for initial prescription of intrauterine contraceptive device: Secondary | ICD-10-CM | POA: Diagnosis not present

## 2017-11-26 DIAGNOSIS — N871 Moderate cervical dysplasia: Secondary | ICD-10-CM

## 2017-11-26 DIAGNOSIS — N946 Dysmenorrhea, unspecified: Secondary | ICD-10-CM

## 2017-11-26 NOTE — Progress Notes (Signed)
Pt stated that she is doing okay just wants to know her test results.

## 2017-11-26 NOTE — Progress Notes (Signed)
HPI:      [name] is a 47 y.o. [phone] who LMP was Patient's last menstrual period was 11/25/2017.  Subjective:   She presents today for follow-up of colposcopy and for IUD insertion for cycle control. She has been experiencing very heavy menstrual bleeding with significant cramping every cycle.  She is chosen an IUD for possible hormonal control.    Hx: The following portions of the patient's history were reviewed and updated as appropriate:             She  has a past medical history of Anxiety, Aortic regurgitation, Depression, Gall stones, Headache, Hypertension, Kidney atrophy, Pneumonia, and Thyroid disease. She does not have any pertinent problems on file. She  has a past surgical history that includes Cholecystectomy. Her family history includes Cancer in her father and paternal grandmother; Diabetes in her other; Heart failure in her other; Hypertension in her maternal grandfather; Stroke in her maternal grandfather; Thyroid disease in her mother. She  reports that she has been smoking cigarettes.  She has been smoking about 1.00 pack per day. She has never used smokeless tobacco. She reports that she does not drink alcohol or use drugs. She has a current medication list which includes the following prescription(s): albuterol, budesonide-formoterol, bupropion, carbonyl iron, cetirizine, clonazepam, ezetimibe, furosemide, levothyroxine, meclizine, potassium chloride, ranitidine, simvastatin, spironolactone, and vitamin d (ergocalciferol). She has No Known Allergies.       Review of Systems:  Review of Systems  Constitutional: Denied constitutional symptoms, night sweats, recent illness, fatigue, fever, insomnia and weight loss.  Eyes: Denied eye symptoms, eye pain, photophobia, vision change and visual disturbance.  Ears/Nose/Throat/Neck: Denied ear, nose, throat or neck symptoms, hearing loss, nasal discharge, sinus congestion and sore throat.  Cardiovascular: Denied  cardiovascular symptoms, arrhythmia, chest pain/pressure, edema, exercise intolerance, orthopnea and palpitations.  Respiratory: Denied pulmonary symptoms, asthma, pleuritic pain, productive sputum, cough, dyspnea and wheezing.  Gastrointestinal: Denied, gastro-esophageal reflux, melena, nausea and vomiting.  Genitourinary: See HPI for additional information.  Musculoskeletal: Denied musculoskeletal symptoms, stiffness, swelling, muscle weakness and myalgia.  Dermatologic: Denied dermatology symptoms, rash and scar.  Neurologic: Denied neurology symptoms, dizziness, headache, neck pain and syncope.  Psychiatric: Denied psychiatric symptoms, anxiety and depression.  Endocrine: Denied endocrine symptoms including hot flashes and night sweats.   Meds:   Current Outpatient Medications on File Prior to Visit  Medication Sig Dispense Refill  . albuterol (PROVENTIL HFA;VENTOLIN HFA) 108 (90 BASE) MCG/ACT inhaler Inhale 2 puffs into the lungs every 4 (four) hours as needed for wheezing or shortness of breath.    . budesonide-formoterol (SYMBICORT) 160-4.5 MCG/ACT inhaler Inhale 2 puffs into the lungs 2 (two) times daily.    Marland Kitchen buPROPion (WELLBUTRIN XL) 150 MG 24 hr tablet   2  . carbonyl iron (FEOSOL) 45 MG TABS tablet Take by mouth.    . cetirizine (ZYRTEC) 10 MG tablet   5  . clonazePAM (KLONOPIN) 0.5 MG tablet Take 0.5 mg 2 (two) times daily as needed by mouth for anxiety.    Marland Kitchen ezetimibe (ZETIA) 10 MG tablet   2  . furosemide (LASIX) 40 MG tablet Take 40 mg by mouth daily.    Marland Kitchen levothyroxine (SYNTHROID, LEVOTHROID) 25 MCG tablet Take 25 mcg daily before breakfast by mouth.    . meclizine (ANTIVERT) 25 MG tablet   2  . potassium chloride (K-DUR,KLOR-CON) 10 MEQ tablet Take 10 mEq by mouth daily.    . ranitidine (ZANTAC 75) 75 MG tablet Take  1 tablet (75 mg total) by mouth 2 (two) times daily. 14 tablet 0  . simvastatin (ZOCOR) 40 MG tablet Take 40 mg by mouth.    . spironolactone (ALDACTONE) 25  MG tablet Take 25 mg by mouth.    . Vitamin D, Ergocalciferol, (DRISDOL) 50000 units CAPS capsule Take by mouth.     No current facility-administered medications on file prior to visit.     Objective:     Vitals:   11/26/17 1331  BP: 122/79  Pulse: 89    Physical examination   Pelvic:   Vulva: Normal appearance.  No lesions.  Vagina: No lesions or abnormalities noted.  Support: Normal pelvic support.  Urethra No masses tenderness or scarring.  Meatus Normal size without lesions or prolapse.  Cervix: Normal appearance.  No lesions.  Anus: Normal exam.  No lesions.  Perineum: Normal exam.  No lesions.        Bimanual   Uterus: Top normal size.  Non-tender.  Mobile.  AV.  Adnexae: No masses.  Non-tender to palpation.  Cul-de-sac: Negative for abnormality.   IUD Procedure Pt has read the booklet and signed the appropriate forms regarding the Mirena IUD.  All of her questions have been answered.   The cervix was cleansed with betadine solution.  After sounding the uterus and noting the position, the IUD was placed in the usual manner without problem.  The string was cut to the appropriate length.  The patient tolerated the procedure well.              Assessment:    [id number] Patient Active Problem List   Diagnosis Date Noted  . Malnutrition of moderate degree 06/30/2015  . RUQ abdominal pain   . Acute cholecystitis 06/28/2015  . Allergic reaction 05/26/2015     1. Dysplasia of cervix, high grade CIN 2   2. Menorrhagia with regular cycle   3. Dysmenorrhea       Plan:             F/U  Return in about 1 month (around 12/24/2017).  For IUD follow-up  We have discussed CIN-2 again in detail.  The possibility of LEEP versus continued colposcopy was discussed.  She would like to continue colposcopy at this time.  Follow-up colposcopy will be 6 months.  I spent 20 minutes involved in the care of this patient of which greater than 50% was spent discussing CIN-2, HPV,  abnormal Pap smears, treatment options including LEEP.  IUD insertion risks and benefits of IUD follow-up for IUD.  Finis Bud, M.D. 11/26/2017 2:04 PM

## 2017-12-26 ENCOUNTER — Encounter: Payer: Self-pay | Admitting: Obstetrics and Gynecology

## 2017-12-26 ENCOUNTER — Other Ambulatory Visit: Payer: Self-pay

## 2017-12-26 ENCOUNTER — Telehealth: Payer: Self-pay | Admitting: Obstetrics and Gynecology

## 2017-12-26 ENCOUNTER — Emergency Department
Admission: EM | Admit: 2017-12-26 | Discharge: 2017-12-26 | Disposition: A | Discharge status: Home / Self Care | Payer: Medicaid Other | Attending: Emergency Medicine | Admitting: Emergency Medicine

## 2017-12-26 ENCOUNTER — Ambulatory Visit (INDEPENDENT_AMBULATORY_CARE_PROVIDER_SITE_OTHER): Payer: Medicaid Other | Admitting: Obstetrics and Gynecology

## 2017-12-26 ENCOUNTER — Emergency Department: Payer: Medicaid Other

## 2017-12-26 ENCOUNTER — Encounter: Payer: Self-pay | Admitting: Emergency Medicine

## 2017-12-26 VITALS — BP 117/74 | HR 88 | Ht 65.0 in | Wt 216.1 lb

## 2017-12-26 DIAGNOSIS — R1010 Upper abdominal pain, unspecified: Secondary | ICD-10-CM | POA: Diagnosis present

## 2017-12-26 DIAGNOSIS — Z30431 Encounter for routine checking of intrauterine contraceptive device: Secondary | ICD-10-CM

## 2017-12-26 DIAGNOSIS — F17210 Nicotine dependence, cigarettes, uncomplicated: Secondary | ICD-10-CM | POA: Insufficient documentation

## 2017-12-26 DIAGNOSIS — K8020 Calculus of gallbladder without cholecystitis without obstruction: Secondary | ICD-10-CM | POA: Insufficient documentation

## 2017-12-26 DIAGNOSIS — N871 Moderate cervical dysplasia: Secondary | ICD-10-CM | POA: Diagnosis not present

## 2017-12-26 DIAGNOSIS — I10 Essential (primary) hypertension: Secondary | ICD-10-CM | POA: Diagnosis not present

## 2017-12-26 LAB — COMPREHENSIVE METABOLIC PANEL
ALK PHOS: 86 U/L (ref 38–126)
ALT: 12 U/L — AB (ref 14–54)
AST: 15 U/L (ref 15–41)
Albumin: 4.3 g/dL (ref 3.5–5.0)
Anion gap: 12 (ref 5–15)
BUN: 21 mg/dL — AB (ref 6–20)
CALCIUM: 9 mg/dL (ref 8.9–10.3)
CHLORIDE: 105 mmol/L (ref 101–111)
CO2: 19 mmol/L — AB (ref 22–32)
Creatinine, Ser: 1.58 mg/dL — ABNORMAL HIGH (ref 0.44–1.00)
GFR calc non Af Amer: 39 mL/min — ABNORMAL LOW (ref >60)
GFR, EST AFRICAN AMERICAN: 45 mL/min — AB (ref >60)
GLUCOSE: 101 mg/dL — AB (ref 65–99)
Potassium: 3.7 mmol/L (ref 3.5–5.1)
SODIUM: 136 mmol/L (ref 135–145)
Total Bilirubin: 0.4 mg/dL (ref 0.3–1.2)
Total Protein: 7.8 g/dL (ref 6.5–8.1)

## 2017-12-26 LAB — URINALYSIS, COMPLETE (UACMP) WITH MICROSCOPIC
Bacteria, UA: NONE SEEN
Bilirubin Urine: NEGATIVE
Glucose, UA: NEGATIVE mg/dL
Ketones, ur: NEGATIVE mg/dL
Leukocytes, UA: NEGATIVE
Nitrite: NEGATIVE
PH: 5 (ref 5.0–8.0)
Protein, ur: NEGATIVE mg/dL
SPECIFIC GRAVITY, URINE: 1.005 (ref 1.005–1.030)

## 2017-12-26 LAB — CBC
HCT: 40.5 % (ref 35.0–47.0)
Hemoglobin: 13.9 g/dL (ref 12.0–16.0)
MCH: 32 pg (ref 26.0–34.0)
MCHC: 34.3 g/dL (ref 32.0–36.0)
MCV: 93.4 fL (ref 80.0–100.0)
Platelets: 271 10*3/uL (ref 150–440)
RBC: 4.34 MIL/uL (ref 3.80–5.20)
RDW: 13.6 % (ref 11.5–14.5)
WBC: 9.9 10*3/uL (ref 3.6–11.0)

## 2017-12-26 LAB — LIPASE, BLOOD: LIPASE: 41 U/L (ref 11–51)

## 2017-12-26 MED ORDER — OXYCODONE-ACETAMINOPHEN 5-325 MG PO TABS
2.0000 | ORAL_TABLET | Freq: Once | ORAL | Status: AC
  Administered 2017-12-26: 2 via ORAL
  Filled 2017-12-26: qty 2

## 2017-12-26 MED ORDER — ONDANSETRON HCL 4 MG/2ML IJ SOLN
4.0000 mg | Freq: Once | INTRAMUSCULAR | Status: AC
  Administered 2017-12-26: 4 mg via INTRAVENOUS
  Filled 2017-12-26: qty 2

## 2017-12-26 MED ORDER — FAMOTIDINE IN NACL 20-0.9 MG/50ML-% IV SOLN
20.0000 mg | Freq: Once | INTRAVENOUS | Status: AC
  Administered 2017-12-26: 20 mg via INTRAVENOUS
  Filled 2017-12-26: qty 50

## 2017-12-26 MED ORDER — OXYCODONE-ACETAMINOPHEN 5-325 MG PO TABS: 1.0000 | ORAL_TABLET | Freq: Three times a day (TID) | ORAL | 0 refills | Status: DC | PRN

## 2017-12-26 MED ORDER — FAMOTIDINE 20 MG PO TABS: 20.0000 mg | ORAL_TABLET | Freq: Two times a day (BID) | ORAL | 1 refills | Status: DC

## 2017-12-26 MED ORDER — MORPHINE SULFATE (PF) 4 MG/ML IV SOLN
4.0000 mg | Freq: Once | INTRAVENOUS | Status: AC
  Administered 2017-12-26: 4 mg via INTRAVENOUS
  Filled 2017-12-26: qty 1

## 2017-12-26 MED ORDER — ONDANSETRON 4 MG PO TBDP: 4.0000 mg | ORAL_TABLET | Freq: Three times a day (TID) | ORAL | 0 refills | Status: DC | PRN

## 2017-12-26 NOTE — Progress Notes (Signed)
Pt stated that the IUD is doing well. No complaints.

## 2017-12-26 NOTE — Progress Notes (Signed)
HPI:      [name] is a 47 y.o. [phone] who LMP was No LMP recorded.  Subjective:   She presents today for IUD check.  Patient states that she has been having daily spotting but no significant cramping or bleeding like she previously had.  She is so far very happy with the IUD.  She reports no problems with the IUD strings.     Hx: The following portions of the patient's history were reviewed and updated as appropriate:             She  has a past medical history of Anxiety, Aortic regurgitation, Depression, Gall stones, Headache, Hypertension, Kidney atrophy, Pneumonia, and Thyroid disease. She does not have any pertinent problems on file. She  has a past surgical history that includes Cholecystectomy. Her family history includes Cancer in her father and paternal grandmother; Diabetes in her other; Heart failure in her other; Hypertension in her maternal grandfather; Stroke in her maternal grandfather; Thyroid disease in her mother. She  reports that she has been smoking cigarettes.  She has been smoking about 1.00 pack per day. She has never used smokeless tobacco. She reports that she does not drink alcohol or use drugs. She has a current medication list which includes the following prescription(s): albuterol, budesonide-formoterol, bupropion, carbonyl iron, cetirizine, clonazepam, ezetimibe, furosemide, levothyroxine, meclizine, potassium chloride, ranitidine, simvastatin, spironolactone, and vitamin d (ergocalciferol). She has No Known Allergies.       Review of Systems:  Review of Systems  Constitutional: Denied constitutional symptoms, night sweats, recent illness, fatigue, fever, insomnia and weight loss.  Eyes: Denied eye symptoms, eye pain, photophobia, vision change and visual disturbance.  Ears/Nose/Throat/Neck: Denied ear, nose, throat or neck symptoms, hearing loss, nasal discharge, sinus congestion and sore throat.  Cardiovascular: Denied cardiovascular symptoms,  arrhythmia, chest pain/pressure, edema, exercise intolerance, orthopnea and palpitations.  Respiratory: Denied pulmonary symptoms, asthma, pleuritic pain, productive sputum, cough, dyspnea and wheezing.  Gastrointestinal: Denied, gastro-esophageal reflux, melena, nausea and vomiting.  Genitourinary: Denied genitourinary symptoms including symptomatic vaginal discharge, pelvic relaxation issues, and urinary complaints.  Musculoskeletal: Denied musculoskeletal symptoms, stiffness, swelling, muscle weakness and myalgia.  Dermatologic: Denied dermatology symptoms, rash and scar.  Neurologic: Denied neurology symptoms, dizziness, headache, neck pain and syncope.  Psychiatric: Denied psychiatric symptoms, anxiety and depression.  Endocrine: Denied endocrine symptoms including hot flashes and night sweats.   Meds:   Current Outpatient Medications on File Prior to Visit  Medication Sig Dispense Refill  . albuterol (PROVENTIL HFA;VENTOLIN HFA) 108 (90 BASE) MCG/ACT inhaler Inhale 2 puffs into the lungs every 4 (four) hours as needed for wheezing or shortness of breath.    . budesonide-formoterol (SYMBICORT) 160-4.5 MCG/ACT inhaler Inhale 2 puffs into the lungs 2 (two) times daily.    Marland Kitchen buPROPion (WELLBUTRIN XL) 150 MG 24 hr tablet   2  . carbonyl iron (FEOSOL) 45 MG TABS tablet Take by mouth.    . cetirizine (ZYRTEC) 10 MG tablet   5  . clonazePAM (KLONOPIN) 0.5 MG tablet Take 0.5 mg 2 (two) times daily as needed by mouth for anxiety.    Marland Kitchen ezetimibe (ZETIA) 10 MG tablet   2  . furosemide (LASIX) 40 MG tablet Take 40 mg by mouth daily.    Marland Kitchen levothyroxine (SYNTHROID, LEVOTHROID) 25 MCG tablet Take 25 mcg daily before breakfast by mouth.    . meclizine (ANTIVERT) 25 MG tablet   2  . potassium chloride (K-DUR,KLOR-CON) 10 MEQ tablet Take 10 mEq  by mouth daily.    . ranitidine (ZANTAC 75) 75 MG tablet Take 1 tablet (75 mg total) by mouth 2 (two) times daily. 14 tablet 0  . simvastatin (ZOCOR) 40 MG  tablet Take 40 mg by mouth.    . spironolactone (ALDACTONE) 25 MG tablet Take 25 mg by mouth.    . Vitamin D, Ergocalciferol, (DRISDOL) 50000 units CAPS capsule Take by mouth.     No current facility-administered medications on file prior to visit.     Objective:     Vitals:   12/26/17 0902  BP: 117/74  Pulse: 88              Physical examination   Pelvic:   Vulva: Normal appearance.  No lesions.  Vagina: No lesions or abnormalities noted.  Support: Normal pelvic support.  Urethra No masses tenderness or scarring.  Meatus Normal size without lesions or prolapse.  Cervix: Normal appearance.  No lesions. IUD strings noted at cervical os.  Anus: Normal exam.  No lesions.  Perineum: Normal exam.  No lesions.        Bimanual   Uterus: Normal size.  Non-tender.  Mobile.  AV.  Adnexae: No masses.  Non-tender to palpation.  Cul-de-sac: Negative for abnormality.     Assessment:    [id number] Patient Active Problem List   Diagnosis Date Noted  . Malnutrition of moderate degree 06/30/2015  . RUQ abdominal pain   . Acute cholecystitis 06/28/2015  . Allergic reaction 05/26/2015     1. Surveillance of previously prescribed intrauterine contraceptive device   2. CIN II (cervical intraepithelial neoplasia II)     IUD position correctly.  Strings slightly longer than normal but patient not having any difficulty with them.     Plan:            1.  We have again discussed CIN-2 and the importance of close follow-up.  We discussed the management of CIN-2 continued colposcopic follow-up versus LEEP.  Patient knows that a colposcopy is indicated again in 6 months and has already made her appointment.  All of her questions were answered. Orders No orders of the defined types were placed in this encounter.   No orders of the defined types were placed in this encounter.     F/U  Return in about 6 months (around 06/27/2018). I spent 16 minutes involved in the care of this patient of  which greater than 50% was spent discussing future colposcopy and possible treatment for CIN-2, IUD management and expected side effects.  Current IUD cycle control.  Finis Bud, M.D. 12/26/2017 9:28 AM

## 2017-12-26 NOTE — Telephone Encounter (Signed)
Pt was called no answer LM via voicemail that her follow up for her colpo was 6 months.

## 2017-12-26 NOTE — ED Provider Notes (Signed)
[facility] Emergency Department Provider Note       Time seen: ----------------------------------------- 12:45 PM on 12/26/2017 -----------------------------------------   I have reviewed the triage vital signs and the nursing notes.  HISTORY   Chief Complaint Abdominal Pain    HPI Grace Fox is a 47 y.o. female with a history of gallstones, anxiety, hypertension, pneumonia and thyroid disease who presents to the ED for right-sided abdominal pain as well as epigastric pain.  Patient states she does have history of gallbladder problems but has never had a cholecystectomy.  This was all discovered 3 years ago but she has never followed up for subsequent cholecystectomy because there were other issues discovered at the same time.  She called her primary care doctor and they told her to come to the ER for evaluation.  Mostly is having right upper quadrant pain at this time.  There was acute onset this morning.  Past Medical History:  Diagnosis Date  . Anxiety   . Aortic regurgitation   . Depression   . Gall stones   . Headache   . Hypertension   . Kidney atrophy   . Pneumonia   . Thyroid disease     Patient Active Problem List   Diagnosis Date Noted  . Malnutrition of moderate degree 06/30/2015  . RUQ abdominal pain   . Acute cholecystitis 06/28/2015  . Allergic reaction 05/26/2015    Past Surgical History:  Procedure Laterality Date  . CHOLECYSTECTOMY      Allergies Patient has no known allergies.  Social History Social History   Tobacco Use  . Smoking status: Current Every Day Smoker    Packs/day: 1.00    Types: Cigarettes  . Smokeless tobacco: Never Used  Substance Use Topics  . Alcohol use: No  . Drug use: No   Review of Systems Constitutional: Negative for fever. Cardiovascular: Negative for chest pain. Respiratory: Negative for shortness of breath. Gastrointestinal: Positive for abdominal pain Genitourinary:  Negative for dysuria. Musculoskeletal: Negative for back pain. Skin: Negative for rash. Neurological: Negative for headaches, focal weakness or numbness.  All systems negative/normal/unremarkable except as stated in the HPI  ____________________________________________   PHYSICAL EXAM:  VITAL SIGNS: ED Triage Vitals  Enc Vitals Group     BP 12/26/17 1155 136/72     Pulse Rate 12/26/17 1155 (!) 101     Resp 12/26/17 1155 20     Temp 12/26/17 1155 98 F (36.7 C)     Temp Source 12/26/17 1155 Oral     SpO2 12/26/17 1155 97 %     Weight 12/26/17 1157 216 lb (98 kg)     Height 12/26/17 1157 5\' 5"  (1.651 m)     Head Circumference --      Peak Flow --      Pain Score 12/26/17 1156 8     Pain Loc --      Pain Edu? --      Excl. in [place]? --    Constitutional: Alert and oriented.  Mild distress Eyes: Conjunctivae are normal. Normal extraocular movements. ENT   Head: Normocephalic and atraumatic.   Nose: No congestion/rhinnorhea.   Mouth/Throat: Mucous membranes are moist.   Neck: No stridor. Cardiovascular: Normal rate, regular rhythm. No murmurs, rubs, or gallops. Respiratory: Normal respiratory effort without tachypnea nor retractions. Breath sounds are clear and equal bilaterally. No wheezes/rales/rhonchi. Gastrointestinal: Right upper quadrant and epigastric tenderness, no rebound or guarding.  Normal bowel sounds. Musculoskeletal: Nontender with normal range of  motion in extremities. No lower extremity tenderness nor edema. Neurologic:  Normal speech and language. No gross focal neurologic deficits are appreciated.  Skin:  Skin is warm, dry and intact. No rash noted. Psychiatric: Mood and affect are normal. Speech and behavior are normal.  ____________________________________________  EKG: Interpreted by me.  Sinus rhythm the rate of 100 bpm, normal PR interval, normal QRS size, normal QT, normal axis  ____________________________________________  ED  COURSE:  As part of my medical decision making, I reviewed the following data within the [place] History obtained from family if available, nursing notes, old chart and ekg, as well as notes from prior ED visits. Patient presented for abdominal pain, we will assess with labs and imaging as indicated at this time.   Procedures ____________________________________________   LABS (pertinent positives/negatives)  Labs Reviewed  COMPREHENSIVE METABOLIC PANEL - Abnormal; Notable for the following components:      Result Value   CO2 19 (*)    Glucose, Bld 101 (*)    BUN 21 (*)    Creatinine, Ser 1.58 (*)    ALT 12 (*)    GFR calc non Af Amer 39 (*)    GFR calc Af Amer 45 (*)    All other components within normal limits  URINALYSIS, COMPLETE (UACMP) WITH MICROSCOPIC - Abnormal; Notable for the following components:   Color, Urine STRAW (*)    APPearance CLEAR (*)    Hgb urine dipstick SMALL (*)    All other components within normal limits  LIPASE, BLOOD  CBC    RADIOLOGY Images were viewed by me  Right upper quadrant ultrasound IMPRESSION: Cholelithiasis.  Study otherwise unremarkable. ____________________________________________  DIFFERENTIAL DIAGNOSIS   Gastritis, GERD, peptic ulcer disease, cholecystitis, cholelithiasis  FINAL ASSESSMENT AND PLAN  Abdominal pain, gallstones   Plan: The patient had presented for abdominal pain. Patient's labs are reassuring and at her baseline. Patient's imaging revealed gallstones with no other acute process.  She will be placed on pain medicine as well as antiemetics and antacids.  I will refer her to general surgery for outpatient follow-up.   Laurence Aly, MD   Note: This note was generated in part or whole with voice recognition software. Voice recognition is usually quite accurate but there are transcription errors that can and very often do occur. I apologize for any typographical errors that were  not detected and corrected.     Earleen Newport, MD 12/26/17 [phone]

## 2017-12-26 NOTE — Telephone Encounter (Signed)
The patient called and stated that she would like to speak with a nurse in regards to when she should return for another colpo/ f/u. I was not able to find a note for scheduling in her past appointment dispositions. Please advise.

## 2017-12-26 NOTE — ED Triage Notes (Signed)
Pt reports that she has gall bladder problems and today she started having burning in her RLQ up into her epigastric area. States that she called her PCP and they told her they were booked and to come to the ER to be checked out.

## 2017-12-27 NOTE — Telephone Encounter (Signed)
Pt called no answer LM via voicemail that she needed to follow up in 6 months for a colpo.

## 2018-01-07 ENCOUNTER — Ambulatory Visit (INDEPENDENT_AMBULATORY_CARE_PROVIDER_SITE_OTHER): Payer: Medicaid Other | Admitting: Surgery

## 2018-01-07 ENCOUNTER — Encounter: Payer: Self-pay | Admitting: Surgery

## 2018-01-07 VITALS — BP 124/81 | HR 78 | Temp 98.1°F | Ht 65.0 in | Wt 211.0 lb

## 2018-01-07 DIAGNOSIS — K8000 Calculus of gallbladder with acute cholecystitis without obstruction: Secondary | ICD-10-CM

## 2018-01-07 NOTE — Addendum Note (Signed)
Addended by: Wayna Chalet on: 01/07/2018 03:10 PM   Modules accepted: Orders

## 2018-01-07 NOTE — Patient Instructions (Signed)
Please go to your appointment with Dr. Humphrey Rolls and see what he has to say about your heart. Then we will ask for cardiac clearance to see if surgery could be done.   Please try to quit smoking.

## 2018-01-07 NOTE — Progress Notes (Signed)
Grace Fox is an 47 y.o. female.   Chief Complaint: Right upper quadrant pain Consult requested by Threasa Alpha HPI: This a patient with gallstones and right upper quadrant abdominal pain. Three years of gallstones, recent ED visit with pain. Has been worked up for cardiac problems.  She has some nausea but no emesis and occasional right upper quadrant pain.  She was in the ED on the 20th with her first severe attack which has abated.  Her acholic stools.  She has significant problems in the form of aortic regurgitation and there is question of needing a valve replacement at some point.  She is not currently anticoagulated.  She is Dr. Chancy Milroy for cardiology.  She smokes over a pack of cigarettes per day.  Past Medical History:  Diagnosis Date  . Anxiety   . Aortic regurgitation   . Depression   . Gall stones   . Headache   . Hypertension   . Kidney atrophy   . Pneumonia   . Thyroid disease     Past Surgical History:  Procedure Laterality Date  . CHOLECYSTECTOMY      Family History  Problem Relation Age of Onset  . Diabetes Other   . Heart failure Other   . Thyroid disease Mother   . Cancer Father   . Hypertension Maternal Grandfather   . Stroke Maternal Grandfather   . Cancer Paternal Grandmother   . Breast cancer Neg Hx    Social History:  reports that she has been smoking cigarettes.  She has been smoking about 1.00 pack per day. She has never used smokeless tobacco. She reports that she does not drink alcohol or use drugs.  Allergies: No Known Allergies   (Not in a hospital admission)   Review of Systems:   Review of Systems  Constitutional: Negative for chills and fever.  HENT: Negative.   Eyes: Negative.   Respiratory: Negative.   Cardiovascular: Negative.   Gastrointestinal: Positive for abdominal pain, heartburn and nausea. Negative for blood in stool, constipation, diarrhea and vomiting.  Genitourinary: Negative.   Musculoskeletal: Negative.    Skin: Positive for itching and rash.  Neurological: Negative.   Endo/Heme/Allergies: Negative.   Psychiatric/Behavioral: Negative.     Physical Exam:  Physical Exam  Constitutional: She is oriented to person, place, and time. She appears well-developed and well-nourished. No distress.  Smells of smoke  HENT:  Head: Normocephalic and atraumatic.  Horrible dentition with caries and gingivitis  Eyes: Pupils are equal, round, and reactive to light. EOM are normal. Right eye exhibits no discharge. Left eye exhibits no discharge. No scleral icterus.  Neck: Normal range of motion.  Cardiovascular: Normal rate and regular rhythm.  Murmur heard. Pulmonary/Chest: Effort normal. No stridor. No respiratory distress. She has wheezes. She has no rales.  Abdominal: Soft. She exhibits no distension and no mass. There is no tenderness. There is no guarding.  Negative Murphy sign  Musculoskeletal: Normal range of motion. She exhibits edema. She exhibits no tenderness or deformity.  Neurological: She is alert and oriented to person, place, and time.  Skin: Skin is warm and dry. Rash noted. She is not diaphoretic. No erythema.  Edema and multiple fleabites of the lower extremities  Psychiatric: She has a normal mood and affect. Judgment normal.  Vitals reviewed.   There were no vitals taken for this visit.    No results found for this or any previous visit (from the past 48 hour(s)). No results found.   Assessment/Plan  Ultrasound shows stones.  LFTs are normal. This a patient who is at severe risk for stroke with a aortic valve problem as well as excessive smoking.  I recommended that she stop smoking in anticipation of some day requiring some sort of surgery.  For gallbladder or heart valve.  She is minimally symptomatic at this time and I would not recommend surgical intervention in this patient until she is at least cleared by cardiology.  We will see her back after she has an appoint with  Dr. Yancey Flemings again but I would still be very skeptical about performing surgery in this patient who is set considerable risk for stroke.  She continues to smoke and I talked to her about cessation and she did not acknowledge that she would try.    Florene Glen, MD, FACS

## 2018-01-22 ENCOUNTER — Encounter: Payer: Medicaid Other | Admitting: Obstetrics and Gynecology

## 2018-01-29 ENCOUNTER — Telehealth: Payer: Self-pay

## 2018-01-29 NOTE — Telephone Encounter (Signed)
-----   Message from [place], [place] sent at 01/07/2018  3:10 PM EDT ----- Regarding: cardiac clearance Make sure that the patient saw Dr. Kimberlee Nearing and then fax a cardiac clearance.

## 2018-01-29 NOTE — Telephone Encounter (Signed)
Cardiac clearance form was faxed. Awaiting on response. Then she will need a follow up appointment with Dr. Burt Knack.

## 2018-01-31 NOTE — Telephone Encounter (Signed)
Refaxed cardiac clearance to Dr. Laurelyn Sickle office.

## 2018-02-08 ENCOUNTER — Emergency Department
Admission: EM | Admit: 2018-02-08 | Discharge: 2018-02-08 | Disposition: A | Discharge status: Home / Self Care | Payer: Medicaid Other | Attending: Emergency Medicine | Admitting: Emergency Medicine

## 2018-02-08 ENCOUNTER — Other Ambulatory Visit: Payer: Self-pay

## 2018-02-08 ENCOUNTER — Emergency Department: Payer: Medicaid Other

## 2018-02-08 ENCOUNTER — Encounter: Payer: Self-pay | Admitting: Emergency Medicine

## 2018-02-08 DIAGNOSIS — I10 Essential (primary) hypertension: Secondary | ICD-10-CM | POA: Insufficient documentation

## 2018-02-08 DIAGNOSIS — Z79899 Other long term (current) drug therapy: Secondary | ICD-10-CM | POA: Insufficient documentation

## 2018-02-08 DIAGNOSIS — F419 Anxiety disorder, unspecified: Secondary | ICD-10-CM | POA: Insufficient documentation

## 2018-02-08 DIAGNOSIS — F17210 Nicotine dependence, cigarettes, uncomplicated: Secondary | ICD-10-CM | POA: Diagnosis not present

## 2018-02-08 DIAGNOSIS — F329 Major depressive disorder, single episode, unspecified: Secondary | ICD-10-CM | POA: Insufficient documentation

## 2018-02-08 DIAGNOSIS — K8020 Calculus of gallbladder without cholecystitis without obstruction: Secondary | ICD-10-CM | POA: Insufficient documentation

## 2018-02-08 DIAGNOSIS — R1013 Epigastric pain: Secondary | ICD-10-CM

## 2018-02-08 DIAGNOSIS — Z9049 Acquired absence of other specified parts of digestive tract: Secondary | ICD-10-CM | POA: Insufficient documentation

## 2018-02-08 LAB — COMPREHENSIVE METABOLIC PANEL
ALT: 13 U/L (ref 0–44)
AST: 13 U/L — AB (ref 15–41)
Albumin: 4.2 g/dL (ref 3.5–5.0)
Alkaline Phosphatase: 84 U/L (ref 38–126)
Anion gap: 10 (ref 5–15)
BILIRUBIN TOTAL: 0.4 mg/dL (ref 0.3–1.2)
BUN: 24 mg/dL — AB (ref 6–20)
CHLORIDE: 109 mmol/L (ref 98–111)
CO2: 24 mmol/L (ref 22–32)
CREATININE: 1.64 mg/dL — AB (ref 0.44–1.00)
Calcium: 9.3 mg/dL (ref 8.9–10.3)
GFR calc Af Amer: 43 mL/min — ABNORMAL LOW (ref >60)
GFR, EST NON AFRICAN AMERICAN: 37 mL/min — AB (ref >60)
Glucose, Bld: 124 mg/dL — ABNORMAL HIGH (ref 70–99)
POTASSIUM: 3.6 mmol/L (ref 3.5–5.1)
Sodium: 143 mmol/L (ref 135–145)
Total Protein: 7.6 g/dL (ref 6.5–8.1)

## 2018-02-08 LAB — LIPASE, BLOOD: LIPASE: 46 U/L (ref 11–51)

## 2018-02-08 LAB — CBC
HEMATOCRIT: 38.3 % (ref 35.0–47.0)
Hemoglobin: 13.1 g/dL (ref 12.0–16.0)
MCH: 32 pg (ref 26.0–34.0)
MCHC: 34.2 g/dL (ref 32.0–36.0)
MCV: 93.5 fL (ref 80.0–100.0)
Platelets: 262 10*3/uL (ref 150–440)
RBC: 4.09 MIL/uL (ref 3.80–5.20)
RDW: 13.4 % (ref 11.5–14.5)
WBC: 9.1 10*3/uL (ref 3.6–11.0)

## 2018-02-08 NOTE — Discharge Instructions (Addendum)
Please seek medical attention for any high fevers, chest pain, shortness of breath, change in behavior, persistent vomiting, bloody stool or any other new or concerning symptoms.  

## 2018-02-08 NOTE — ED Notes (Signed)
While assessing, pt states "its my gallbladder". Pt reports referred to surgeon for removal but had to see cardiologist prior. Recently seen cardiologist but states pain too severe to wait for surgery evaluation.

## 2018-02-08 NOTE — ED Provider Notes (Signed)
[facility] Emergency Department Provider Note  ____________________________________________   I have reviewed the triage vital signs and the nursing notes.   HISTORY  Chief Complaint Abdominal Pain   History limited by: Not Limited   HPI Grace Fox is a 47 y.o. female who presents to the emergency department today because of concerns for abdominal pain.  Patient states that she has a history of gallstones.  Started having pain this morning.  She is continued to have pain and is gotten more severe throughout the day.  Denies any fevers.  She states she tried some Tylenol which did not help.   Per medical record review patient has a history of gallstones.  Past Medical History:  Diagnosis Date  . Anxiety   . Aortic regurgitation   . Depression   . Gall stones   . Headache   . Hypertension   . Kidney atrophy   . Pneumonia   . Thyroid disease     Patient Active Problem List   Diagnosis Date Noted  . Malnutrition of moderate degree 06/30/2015  . RUQ abdominal pain   . Acute cholecystitis 06/28/2015  . Allergic reaction 05/26/2015    Past Surgical History:  Procedure Laterality Date  . CHOLECYSTECTOMY      Prior to Admission medications   Medication Sig Start Date End Date Taking? Authorizing Provider  albuterol (PROVENTIL HFA;VENTOLIN HFA) 108 (90 BASE) MCG/ACT inhaler Inhale 2 puffs into the lungs every 4 (four) hours as needed for wheezing or shortness of breath.    [provider]  budesonide-formoterol (SYMBICORT) 160-4.5 MCG/ACT inhaler Inhale 2 puffs into the lungs 2 (two) times daily.    [provider]  buPROPion (WELLBUTRIN XL) 150 MG 24 hr tablet  11/14/17   [provider]  carbonyl iron (FEOSOL) 45 MG TABS tablet Take by mouth.    [provider]  cetirizine (ZYRTEC) 10 MG tablet  11/09/17   [provider]  clonazePAM (KLONOPIN) 0.5 MG tablet Take 0.5 mg 2 (two) times daily as  needed by mouth for anxiety.    [provider]  ezetimibe (ZETIA) 10 MG tablet  11/13/17   [provider]  furosemide (LASIX) 40 MG tablet Take 40 mg by mouth daily.    [provider]  levothyroxine (SYNTHROID, LEVOTHROID) 25 MCG tablet Take 25 mcg daily before breakfast by mouth.    [provider]  meclizine (ANTIVERT) 25 MG tablet  11/09/17   [provider]  ondansetron (ZOFRAN ODT) 4 MG disintegrating tablet Take 1 tablet (4 mg total) by mouth every 8 (eight) hours as needed for nausea or vomiting. 12/26/17   Earleen Newport, MD  potassium chloride (K-DUR,KLOR-CON) 10 MEQ tablet Take 10 mEq by mouth daily.    [provider]  simvastatin (ZOCOR) 40 MG tablet Take 40 mg by mouth.    [provider]  spironolactone (ALDACTONE) 25 MG tablet Take 25 mg by mouth.    [provider]  Vitamin D, Ergocalciferol, (DRISDOL) 50000 units CAPS capsule Take by mouth.    [provider]    Allergies Patient has no known allergies.  Family History  Problem Relation Age of Onset  . Diabetes Other   . Heart failure Other   . Thyroid disease Mother   . Cancer Father   . Hypertension Maternal Grandfather   . Stroke Maternal Grandfather   . Cancer Paternal Grandmother   . Breast cancer Neg Hx  Social History Social History   Tobacco Use  . Smoking status: Current Every Day Smoker    Packs/day: 1.00    Types: Cigarettes  . Smokeless tobacco: Never Used  Substance Use Topics  . Alcohol use: No  . Drug use: No    Review of Systems Constitutional: No fever/chills Eyes: No visual changes. ENT: No sore throat. Cardiovascular: Denies chest pain. Respiratory: Denies shortness of breath. Gastrointestinal: Positive for abdominal pain. Genitourinary: Negative for dysuria. Musculoskeletal: Negative for back pain. Skin: Negative for rash. Neurological: Negative for headaches, focal weakness or  numbness.  ____________________________________________   PHYSICAL EXAM:  VITAL SIGNS: ED Triage Vitals  Enc Vitals Group     BP 02/08/18 1520 137/74     Pulse Rate 02/08/18 1520 96     Resp 02/08/18 1520 18     Temp 02/08/18 1520 98 F (36.7 C)     Temp Source 02/08/18 1520 Oral     SpO2 02/08/18 1520 97 %     Weight 02/08/18 1525 213 lb (96.6 kg)     Height 02/08/18 1525 5\' 5"  (1.651 m)     Head Circumference --      Peak Flow --      Pain Score 02/08/18 1525 10   Constitutional: Alert and oriented.  Eyes: Conjunctivae are normal.  ENT      Head: Normocephalic and atraumatic.      Nose: No congestion/rhinnorhea.      Mouth/Throat: Mucous membranes are moist.      Neck: No stridor. Hematological/Lymphatic/Immunilogical: No cervical lymphadenopathy. Cardiovascular: Normal rate, regular rhythm.  No murmurs, rubs, or gallops.  Respiratory: Normal respiratory effort without tachypnea nor retractions. Breath sounds are clear and equal bilaterally. No wheezes/rales/rhonchi. Gastrointestinal: Soft and tender to palpation in the epigastric and right upper quadrant. No rebound. No guarding.  Genitourinary: Deferred Musculoskeletal: Normal range of motion in all extremities. No lower extremity edema. Neurologic:  Normal speech and language. No gross focal neurologic deficits are appreciated.  Skin:  Skin is warm, dry and intact. No rash noted. Psychiatric: Mood and affect are normal. Speech and behavior are normal. Patient exhibits appropriate insight and judgment.  ____________________________________________    LABS (pertinent positives/negatives)  Lipase 46 CMP na 143, glu 124, cr 1.64 CBC wbc 9.1, hgb 13.1, plt 262  ____________________________________________   EKG  None  ____________________________________________    RADIOLOGY  [place] RUQ Gallstones, no signs of acute  cholecystitis   ____________________________________________   PROCEDURES  Procedures  ____________________________________________   INITIAL IMPRESSION / ASSESSMENT AND PLAN / ED COURSE  Pertinent labs & imaging results that were available during my care of the patient were reviewed by me and considered in my medical decision making (see chart for details).   Patient presented to the emergency department today because of concerns for abdominal pain.  Does have history of gallstones.  Neither blood work or ultrasound is consistent with acute cholecystitis.  Discussed with patient importance of follow-up with surgery.   ____________________________________________   FINAL CLINICAL IMPRESSION(S) / ED DIAGNOSES  Final diagnoses:  Epigastric abdominal pain  Gallstones     Note: This dictation was prepared with Dragon dictation. Any transcriptional errors that result from this process are unintentional     Nance Pear, MD 02/08/18 2036

## 2018-02-08 NOTE — ED Triage Notes (Signed)
Mid abdominal pain began this am.

## 2018-02-17 ENCOUNTER — Ambulatory Visit: Payer: Self-pay | Admitting: General Surgery

## 2018-02-25 ENCOUNTER — Other Ambulatory Visit: Payer: Self-pay

## 2018-02-25 ENCOUNTER — Encounter
Admission: RE | Admit: 2018-02-25 | Discharge: 2018-02-25 | Disposition: A | Discharge status: Home / Self Care | Payer: Medicaid Other | Source: Ambulatory Visit | Attending: General Surgery | Admitting: General Surgery

## 2018-02-25 HISTORY — DX: Dizziness and giddiness: R42

## 2018-02-25 HISTORY — DX: Hypothyroidism, unspecified: E03.9

## 2018-02-25 HISTORY — DX: Dorsalgia, unspecified: M54.9

## 2018-02-25 HISTORY — DX: Dyspnea, unspecified: R06.00

## 2018-02-25 HISTORY — DX: Chronic obstructive pulmonary disease, unspecified: J44.9

## 2018-02-25 HISTORY — DX: Heart failure, unspecified: I50.9

## 2018-02-25 HISTORY — DX: Hypoxemia: R09.02

## 2018-02-25 HISTORY — DX: Gastro-esophageal reflux disease without esophagitis: K21.9

## 2018-02-25 NOTE — Patient Instructions (Signed)
Your procedure is scheduled on: 02/28/18 Report to Day Surgery. [facility] SECOND FLOOR To find out your arrival time please call [phone] between 1PM - 3PM on 02/27/18  Remember: Instructions that are not followed completely may result in serious medical risk,  up to and including death, or upon the discretion of your surgeon and anesthesiologist your  surgery may need to be rescheduled.     _X__ 1. Do not eat food after midnight the night before your procedure.                 No gum chewing or hard candies. You may drink clear liquids up to 2 hours                 before you are scheduled to arrive for your surgery- DO not drink clear                 liquids within 2 hours of the start of your surgery.                 Clear Liquids include:  water, apple juice without pulp, clear carbohydrate                 drink such as Clearfast of Gatorade, Black Coffee or Tea (Do not add                 anything to coffee or tea).  __X__2.  On the morning of surgery brush your teeth with toothpaste and water, you                may rinse your mouth with mouthwash if you wish.  Do not swallow any toothpaste of mouthwash.     _X__ 3.  No Alcohol for 24 hours before or after surgery.   _X__ 4.  Do Not Smoke or use e-cigarettes For 24 Hours Prior to Your Surgery.                 Do not use any chewable tobacco products for at least 6 hours prior to                 surgery.  ____  5.  Bring all medications with you on the day of surgery if instructed.   _X___  6.  Notify your doctor if there is any change in your medical condition      (cold, fever, infections).     Do not wear jewelry, make-up, hairpins, clips or nail polish. Do not wear lotions, powders, or perfumes. You may wear deodorant. Do not shave 48 hours prior to surgery. Men may shave face and neck. Do not bring valuables to the hospital.    [facility] is not responsible for any belongings or  valuables.  Contacts, dentures or bridgework may not be worn into surgery. Leave your suitcase in the car. After surgery it may be brought to your room. For patients admitted to the hospital, discharge time is determined by your treatment team.   Patients discharged the day of surgery will not be allowed to drive home.   Please read over the following fact sheets that you were given:   Surgical Site Infection Prevention     __X__ Take these medicines the morning of surgery with A SIP OF WATER:    1.CLONAZEPAM  2. FAMOTIDINE  3. MECLIZINE  4. LEVOTHYROXINE  5.  6.  ____ Fleet Enema (as directed)   X__ Use CHG Soap as  directed  __X__ Use inhalers on the day of surgery   AND BRING  ____ Stop metformin 2 days prior to surgery    ____ Take 1/2 of usual insulin dose the night before surgery. No insulin the morning          of surgery.   ____ Stop Coumadin/Plavix/aspirin on   ____ Stop Anti-inflammatories on   ____ Stop supplements until after surgery.    ____ Bring C-Pap to the hospital.

## 2018-02-25 NOTE — Pre-Procedure Instructions (Signed)
FAXED TO DR Laurelyn Sickle FOR CLEARANCE NOTE. LM ON NURSE LINE.

## 2018-02-25 NOTE — Telephone Encounter (Signed)
Refaxed cardiac clearance to Dr. Laurelyn Sickle office again.

## 2018-02-26 NOTE — Pre-Procedure Instructions (Signed)
CLEARED LOW RISK BY DR S KHAN LOW RISK NOTE ON CHART

## 2018-02-27 MED ORDER — CEFAZOLIN SODIUM-DEXTROSE 2-4 GM/100ML-% IV SOLN
2.0000 g | INTRAVENOUS | Status: AC
  Administered 2018-02-28: 2 g via INTRAVENOUS

## 2018-02-28 ENCOUNTER — Ambulatory Visit: Payer: Medicaid Other | Admitting: Certified Registered"

## 2018-02-28 ENCOUNTER — Ambulatory Visit: Payer: Medicaid Other

## 2018-02-28 ENCOUNTER — Other Ambulatory Visit: Payer: Self-pay

## 2018-02-28 ENCOUNTER — Encounter: Payer: Self-pay | Admitting: *Deleted

## 2018-02-28 ENCOUNTER — Observation Stay
Admission: RE | Admit: 2018-02-28 | Discharge: 2018-03-01 | Disposition: A | Discharge status: Home / Self Care | Payer: Medicaid Other | Source: Ambulatory Visit | Attending: General Surgery | Admitting: General Surgery

## 2018-02-28 ENCOUNTER — Encounter
Admission: RE | Disposition: A | Discharge status: Home / Self Care | Payer: Self-pay | Source: Ambulatory Visit | Attending: General Surgery

## 2018-02-28 DIAGNOSIS — F419 Anxiety disorder, unspecified: Secondary | ICD-10-CM | POA: Diagnosis not present

## 2018-02-28 DIAGNOSIS — K8020 Calculus of gallbladder without cholecystitis without obstruction: Secondary | ICD-10-CM | POA: Diagnosis present

## 2018-02-28 DIAGNOSIS — J449 Chronic obstructive pulmonary disease, unspecified: Secondary | ICD-10-CM | POA: Diagnosis not present

## 2018-02-28 DIAGNOSIS — K8010 Calculus of gallbladder with chronic cholecystitis without obstruction: Principal | ICD-10-CM | POA: Insufficient documentation

## 2018-02-28 DIAGNOSIS — I10 Essential (primary) hypertension: Secondary | ICD-10-CM | POA: Diagnosis not present

## 2018-02-28 DIAGNOSIS — Z79890 Hormone replacement therapy (postmenopausal): Secondary | ICD-10-CM | POA: Insufficient documentation

## 2018-02-28 DIAGNOSIS — I509 Heart failure, unspecified: Secondary | ICD-10-CM | POA: Diagnosis not present

## 2018-02-28 DIAGNOSIS — E039 Hypothyroidism, unspecified: Secondary | ICD-10-CM | POA: Diagnosis not present

## 2018-02-28 DIAGNOSIS — Z79899 Other long term (current) drug therapy: Secondary | ICD-10-CM | POA: Diagnosis not present

## 2018-02-28 DIAGNOSIS — F329 Major depressive disorder, single episode, unspecified: Secondary | ICD-10-CM | POA: Insufficient documentation

## 2018-02-28 DIAGNOSIS — Z9049 Acquired absence of other specified parts of digestive tract: Secondary | ICD-10-CM

## 2018-02-28 HISTORY — PX: CHOLECYSTECTOMY: SHX55

## 2018-02-28 LAB — POCT PREGNANCY, URINE: Preg Test, Ur: NEGATIVE

## 2018-02-28 SURGERY — LAPAROSCOPIC CHOLECYSTECTOMY WITH INTRAOPERATIVE CHOLANGIOGRAM
Anesthesia: General | Site: Abdomen | Wound class: Clean Contaminated

## 2018-02-28 MED ORDER — PROPOFOL 10 MG/ML IV BOLUS
INTRAVENOUS | Status: AC
  Filled 2018-02-28: qty 20

## 2018-02-28 MED ORDER — HYDROCODONE-ACETAMINOPHEN 5-325 MG PO TABS: 1.0000 | ORAL_TABLET | Freq: Four times a day (QID) | ORAL | 0 refills | Status: DC | PRN

## 2018-02-28 MED ORDER — MOMETASONE FURO-FORMOTEROL FUM 200-5 MCG/ACT IN AERO
2.0000 | INHALATION_SPRAY | Freq: Two times a day (BID) | RESPIRATORY_TRACT | Status: DC
  Administered 2018-03-01: 2 via RESPIRATORY_TRACT
  Filled 2018-02-28: qty 8.8

## 2018-02-28 MED ORDER — CELECOXIB 200 MG PO CAPS
ORAL_CAPSULE | ORAL | Status: AC
  Administered 2018-02-28: 200 mg via ORAL
  Filled 2018-02-28: qty 1

## 2018-02-28 MED ORDER — ROCURONIUM BROMIDE 50 MG/5ML IV SOLN
INTRAVENOUS | Status: AC
  Filled 2018-02-28: qty 1

## 2018-02-28 MED ORDER — ONDANSETRON HCL 4 MG/2ML IJ SOLN
INTRAMUSCULAR | Status: AC
  Filled 2018-02-28: qty 2

## 2018-02-28 MED ORDER — GABAPENTIN 300 MG PO CAPS
300.0000 mg | ORAL_CAPSULE | ORAL | Status: AC
  Administered 2018-02-28: 300 mg via ORAL

## 2018-02-28 MED ORDER — CELECOXIB 200 MG PO CAPS
200.0000 mg | ORAL_CAPSULE | ORAL | Status: AC
  Administered 2018-02-28: 200 mg via ORAL

## 2018-02-28 MED ORDER — FENTANYL CITRATE (PF) 100 MCG/2ML IJ SOLN
INTRAMUSCULAR | Status: DC | PRN
  Administered 2018-02-28 (×2): 50 ug via INTRAVENOUS
  Administered 2018-02-28: 100 ug via INTRAVENOUS

## 2018-02-28 MED ORDER — GABAPENTIN 300 MG PO CAPS
ORAL_CAPSULE | ORAL | Status: AC
  Administered 2018-02-28: 300 mg via ORAL
  Filled 2018-02-28: qty 1

## 2018-02-28 MED ORDER — BUPIVACAINE-EPINEPHRINE 0.25% -1:200000 IJ SOLN
INTRAMUSCULAR | Status: DC | PRN
  Administered 2018-02-28: 23 mL

## 2018-02-28 MED ORDER — HYDROCODONE-ACETAMINOPHEN 5-325 MG PO TABS
ORAL_TABLET | ORAL | Status: AC
  Administered 2018-02-28: 1 via ORAL
  Filled 2018-02-28: qty 1

## 2018-02-28 MED ORDER — FENTANYL CITRATE (PF) 100 MCG/2ML IJ SOLN
INTRAMUSCULAR | Status: AC
  Administered 2018-02-28: 25 ug via INTRAVENOUS
  Filled 2018-02-28: qty 2

## 2018-02-28 MED ORDER — ONDANSETRON HCL 4 MG/2ML IJ SOLN
4.0000 mg | Freq: Once | INTRAMUSCULAR | Status: AC
  Administered 2018-02-28: 4 mg via INTRAVENOUS

## 2018-02-28 MED ORDER — CHLORHEXIDINE GLUCONATE CLOTH 2 % EX PADS
6.0000 | MEDICATED_PAD | Freq: Once | CUTANEOUS | Status: AC
  Administered 2018-02-28: 6 via TOPICAL

## 2018-02-28 MED ORDER — FENTANYL CITRATE (PF) 100 MCG/2ML IJ SOLN
25.0000 ug | INTRAMUSCULAR | Status: AC | PRN
  Administered 2018-02-28 (×6): 25 ug via INTRAVENOUS

## 2018-02-28 MED ORDER — BUPIVACAINE-EPINEPHRINE (PF) 0.25% -1:200000 IJ SOLN
INTRAMUSCULAR | Status: AC
  Filled 2018-02-28: qty 30

## 2018-02-28 MED ORDER — LORATADINE 10 MG PO TABS
10.0000 mg | ORAL_TABLET | Freq: Every day | ORAL | Status: DC
  Administered 2018-02-28 – 2018-03-01 (×2): 10 mg via ORAL
  Filled 2018-02-28 (×2): qty 1

## 2018-02-28 MED ORDER — HYDROCODONE-ACETAMINOPHEN 5-325 MG PO TABS
1.0000 | ORAL_TABLET | Freq: Four times a day (QID) | ORAL | Status: DC | PRN
  Administered 2018-02-28 – 2018-03-01 (×3): 1 via ORAL
  Administered 2018-03-01: 2 via ORAL
  Filled 2018-02-28: qty 2
  Filled 2018-02-28: qty 1
  Filled 2018-02-28: qty 2

## 2018-02-28 MED ORDER — FENTANYL CITRATE (PF) 100 MCG/2ML IJ SOLN
INTRAMUSCULAR | Status: AC
  Filled 2018-02-28: qty 2

## 2018-02-28 MED ORDER — BUPROPION HCL ER (XL) 150 MG PO TB24
150.0000 mg | ORAL_TABLET | Freq: Every day | ORAL | Status: DC
  Administered 2018-02-28 – 2018-03-01 (×2): 150 mg via ORAL
  Filled 2018-02-28 (×2): qty 1

## 2018-02-28 MED ORDER — SUGAMMADEX SODIUM 200 MG/2ML IV SOLN
INTRAVENOUS | Status: AC
  Filled 2018-02-28: qty 2

## 2018-02-28 MED ORDER — ONDANSETRON HCL 4 MG/2ML IJ SOLN: 4.0000 mg | Freq: Four times a day (QID) | INTRAMUSCULAR | Status: DC | PRN

## 2018-02-28 MED ORDER — FUROSEMIDE 40 MG PO TABS
40.0000 mg | ORAL_TABLET | Freq: Every day | ORAL | Status: DC
  Administered 2018-03-01: 40 mg via ORAL
  Filled 2018-02-28: qty 1

## 2018-02-28 MED ORDER — LIDOCAINE HCL (PF) 2 % IJ SOLN
INTRAMUSCULAR | Status: AC
  Filled 2018-02-28: qty 10

## 2018-02-28 MED ORDER — DEXAMETHASONE SODIUM PHOSPHATE 10 MG/ML IJ SOLN
INTRAMUSCULAR | Status: AC
  Filled 2018-02-28: qty 1

## 2018-02-28 MED ORDER — CEFAZOLIN SODIUM-DEXTROSE 2-4 GM/100ML-% IV SOLN
INTRAVENOUS | Status: AC
  Filled 2018-02-28: qty 100

## 2018-02-28 MED ORDER — ACETAMINOPHEN 500 MG PO TABS
ORAL_TABLET | ORAL | Status: AC
  Administered 2018-02-28: 1000 mg via ORAL
  Filled 2018-02-28: qty 2

## 2018-02-28 MED ORDER — CHLORHEXIDINE GLUCONATE CLOTH 2 % EX PADS: 6.0000 | MEDICATED_PAD | Freq: Once | CUTANEOUS | Status: DC

## 2018-02-28 MED ORDER — FAMOTIDINE 20 MG PO TABS
20.0000 mg | ORAL_TABLET | Freq: Two times a day (BID) | ORAL | Status: DC
  Administered 2018-02-28 – 2018-03-01 (×2): 20 mg via ORAL
  Filled 2018-02-28 (×2): qty 1

## 2018-02-28 MED ORDER — SODIUM CHLORIDE 0.9 % IR SOLN
Status: DC | PRN
  Administered 2018-02-28: 1000 mL

## 2018-02-28 MED ORDER — CLONAZEPAM 0.5 MG PO TABS: 0.5000 mg | ORAL_TABLET | Freq: Two times a day (BID) | ORAL | Status: DC | PRN

## 2018-02-28 MED ORDER — DEXAMETHASONE SODIUM PHOSPHATE 10 MG/ML IJ SOLN
INTRAMUSCULAR | Status: DC | PRN
  Administered 2018-02-28: 8 mg via INTRAVENOUS

## 2018-02-28 MED ORDER — ROCURONIUM BROMIDE 100 MG/10ML IV SOLN
INTRAVENOUS | Status: DC | PRN
  Administered 2018-02-28: 45 mg via INTRAVENOUS
  Administered 2018-02-28: 10 mg via INTRAVENOUS

## 2018-02-28 MED ORDER — ONDANSETRON HCL 4 MG/2ML IJ SOLN
INTRAMUSCULAR | Status: DC | PRN
  Administered 2018-02-28: 4 mg via INTRAVENOUS

## 2018-02-28 MED ORDER — LEVOTHYROXINE SODIUM 50 MCG PO TABS
25.0000 ug | ORAL_TABLET | Freq: Every day | ORAL | Status: DC
  Administered 2018-03-01: 25 ug via ORAL
  Filled 2018-02-28: qty 1

## 2018-02-28 MED ORDER — PHENYLEPHRINE HCL 10 MG/ML IJ SOLN
INTRAMUSCULAR | Status: DC | PRN
  Administered 2018-02-28: 50 ug via INTRAVENOUS

## 2018-02-28 MED ORDER — ONDANSETRON 4 MG PO TBDP: 4.0000 mg | ORAL_TABLET | Freq: Four times a day (QID) | ORAL | Status: DC | PRN

## 2018-02-28 MED ORDER — ACETAMINOPHEN 500 MG PO TABS
1000.0000 mg | ORAL_TABLET | ORAL | Status: AC
  Administered 2018-02-28: 1000 mg via ORAL

## 2018-02-28 MED ORDER — MORPHINE SULFATE (PF) 2 MG/ML IV SOLN
1.0000 mg | INTRAVENOUS | Status: DC | PRN
  Administered 2018-02-28: 2 mg via INTRAVENOUS
  Filled 2018-02-28: qty 1

## 2018-02-28 MED ORDER — LIDOCAINE HCL (CARDIAC) PF 100 MG/5ML IV SOSY
PREFILLED_SYRINGE | INTRAVENOUS | Status: DC | PRN
  Administered 2018-02-28: 100 mg via INTRAVENOUS

## 2018-02-28 MED ORDER — MIDAZOLAM HCL 2 MG/2ML IJ SOLN
INTRAMUSCULAR | Status: DC | PRN
  Administered 2018-02-28: 2 mg via INTRAVENOUS

## 2018-02-28 MED ORDER — SUGAMMADEX SODIUM 200 MG/2ML IV SOLN
INTRAVENOUS | Status: DC | PRN
  Administered 2018-02-28: 200 mg via INTRAVENOUS

## 2018-02-28 MED ORDER — ALBUTEROL SULFATE (2.5 MG/3ML) 0.083% IN NEBU: 2.5000 mg | INHALATION_SOLUTION | RESPIRATORY_TRACT | Status: DC | PRN

## 2018-02-28 MED ORDER — SODIUM CHLORIDE 0.9 % IV SOLN
INTRAVENOUS | Status: DC
  Administered 2018-02-28: 11:00:00 via INTRAVENOUS

## 2018-02-28 MED ORDER — POTASSIUM CHLORIDE CRYS ER 20 MEQ PO TBCR
20.0000 meq | EXTENDED_RELEASE_TABLET | Freq: Every day | ORAL | Status: DC
  Administered 2018-03-01: 20 meq via ORAL
  Filled 2018-02-28: qty 1

## 2018-02-28 MED ORDER — SODIUM CHLORIDE 0.9 % IV SOLN: INTRAVENOUS | Status: DC

## 2018-02-28 MED ORDER — MIDAZOLAM HCL 2 MG/2ML IJ SOLN
INTRAMUSCULAR | Status: AC
  Filled 2018-02-28: qty 2

## 2018-02-28 MED ORDER — HEPARIN SODIUM (PORCINE) 5000 UNIT/ML IJ SOLN: 5000.0000 [IU] | Freq: Three times a day (TID) | INTRAMUSCULAR | Status: DC

## 2018-02-28 MED ORDER — SODIUM CHLORIDE 0.9 % IV SOLN
INTRAVENOUS | Status: DC
  Administered 2018-02-28: 17:00:00 via INTRAVENOUS

## 2018-02-28 MED ORDER — MORPHINE SULFATE (PF) 2 MG/ML IV SOLN: 1.0000 mg | INTRAVENOUS | Status: DC | PRN

## 2018-02-28 MED ORDER — PROPOFOL 10 MG/ML IV BOLUS
INTRAVENOUS | Status: DC | PRN
  Administered 2018-02-28: 150 mg via INTRAVENOUS

## 2018-02-28 SURGICAL SUPPLY — 33 items
APPLIER CLIP 5 13 M/L LIGAMAX5 (MISCELLANEOUS) ×3
BLADE CLIPPER SURG (BLADE) IMPLANT
CANISTER SUCT 3000ML PPV (MISCELLANEOUS) ×3 IMPLANT
CATH REDDICK CHOLANGI 4FR 50CM (CATHETERS) ×3 IMPLANT
CHLORAPREP W/TINT 26ML (MISCELLANEOUS) ×3 IMPLANT
CLIP APPLIE 5 13 M/L LIGAMAX5 (MISCELLANEOUS) ×1 IMPLANT
COVER LIGHT HANDLE STERIS (MISCELLANEOUS) IMPLANT
DERMABOND ADVANCED (GAUZE/BANDAGES/DRESSINGS) ×2
DERMABOND ADVANCED .7 DNX12 (GAUZE/BANDAGES/DRESSINGS) ×1 IMPLANT
DRAPE UTILITY 15X26 TOWEL STRL (DRAPES) ×6 IMPLANT
ELECT REM PT RETURN 9FT ADLT (ELECTROSURGICAL) ×3
ELECTRODE REM PT RTRN 9FT ADLT (ELECTROSURGICAL) ×1 IMPLANT
GLOVE BIO SURGEON STRL SZ7.5 (GLOVE) ×3 IMPLANT
GOWN STRL REUS W/ TWL LRG LVL3 (GOWN DISPOSABLE) ×3 IMPLANT
GOWN STRL REUS W/TWL LRG LVL3 (GOWN DISPOSABLE) ×6
HEMOSTAT SURGICEL 2X3 (HEMOSTASIS) ×6 IMPLANT
IRRIGATION STRYKERFLOW (MISCELLANEOUS) ×1 IMPLANT
IRRIGATOR STRYKERFLOW (MISCELLANEOUS) ×3
IV CATH ANGIO 12GX3 LT BLUE (NEEDLE) ×3 IMPLANT
KIT PINK PAD W/HEAD ARE REST (MISCELLANEOUS) ×3
KIT PINK PAD W/HEAD ARM REST (MISCELLANEOUS) ×1 IMPLANT
KIT TURNOVER KIT A (KITS) ×3 IMPLANT
NS IRRIG 1000ML POUR BTL (IV SOLUTION) ×3 IMPLANT
PACK BASIN MAJOR ARMC (MISCELLANEOUS) IMPLANT
PACK LAP CHOLECYSTECTOMY (MISCELLANEOUS) ×3 IMPLANT
POUCH SPECIMEN RETRIEVAL 10MM (ENDOMECHANICALS) IMPLANT
SCISSORS METZENBAUM CVD 33 (INSTRUMENTS) ×3 IMPLANT
SLEEVE ENDOPATH XCEL 5M (ENDOMECHANICALS) ×6 IMPLANT
SUT MNCRL AB 4-0 PS2 18 (SUTURE) ×3 IMPLANT
TROCAR XCEL BLUNT TIP 100MML (ENDOMECHANICALS) ×6 IMPLANT
TROCAR XCEL NON-BLD 5MMX100MML (ENDOMECHANICALS) ×3 IMPLANT
TUBING INSUFFLATION (TUBING) ×3 IMPLANT
WATER STERILE IRR 1000ML POUR (IV SOLUTION) ×3 IMPLANT

## 2018-02-28 NOTE — Interval H&P Note (Signed)
History and Physical Interval Note:  02/28/2018 10:54 AM  Grace Fox  has presented today for surgery, with the diagnosis of gallstones  The various methods of treatment have been discussed with the patient and family. After consideration of risks, benefits and other options for treatment, the patient has consented to  Procedure(s): LAPAROSCOPIC CHOLECYSTECTOMY WITH INTRAOPERATIVE CHOLANGIOGRAM (N/A) as a surgical intervention .  The patient's history has been reviewed, patient examined, no change in status, stable for surgery.  I have reviewed the patient's chart and labs.  Questions were answered to the patient's satisfaction.     TOTH III,Delsy Etzkorn S

## 2018-02-28 NOTE — Transfer of Care (Signed)
Immediate Anesthesia Transfer of Care Note  Patient: Grace Fox  Procedure(s) Performed: LAPAROSCOPIC CHOLECYSTECTOMY WITH INTRAOPERATIVE CHOLANGIOGRAM (N/A Abdomen)  Patient Location: PACU  Anesthesia Type:General  Level of Consciousness: sedated and responds to stimulation  Airway & Oxygen Therapy: Patient Spontanous Breathing and Patient connected to face mask oxygen  Post-op Assessment: Report given to RN and Post -op Vital signs reviewed and stable  Post vital signs: Reviewed and stable  Last Vitals:  Vitals Value Taken Time  BP 140/79 02/28/2018  2:34 PM  Temp 37.1 C 02/28/2018  2:33 PM  Pulse 88 02/28/2018  2:33 PM  Resp 13 02/28/2018  2:34 PM  SpO2 98 % 02/28/2018  2:34 PM    Last Pain: There were no vitals filed for this visit.       Complications: No apparent anesthesia complications

## 2018-02-28 NOTE — Anesthesia Post-op Follow-up Note (Signed)
Anesthesia QCDR form completed.        

## 2018-02-28 NOTE — Discharge Instructions (Signed)

## 2018-02-28 NOTE — Op Note (Signed)
02/28/2018  2:22 PM  PATIENT:  Grace Fox  47 y.o. female  PRE-OPERATIVE DIAGNOSIS:  gallstones  POST-OPERATIVE DIAGNOSIS:  gallstones  PROCEDURE:  Procedure(s): LAPAROSCOPIC CHOLECYSTECTOMY   SURGEON:  Surgeon(s) and Role:    * Jovita Kussmaul, MD - Primary  PHYSICIAN ASSISTANT:   ASSISTANTS: none   ANESTHESIA:   local and general  EBL:  15 mL   BLOOD ADMINISTERED:none  DRAINS: none   LOCAL MEDICATIONS USED:  MARCAINE     SPECIMEN:  Source of Specimen:  gallbladder  DISPOSITION OF SPECIMEN:  PATHOLOGY  COUNTS:  YES  TOURNIQUET:  * No tourniquets in log *  DICTATION: .Dragon Dictation     Procedure: After informed consent was obtained the patient was brought to the operating room and placed in the supine position on the operating room table. After adequate induction of general anesthesia the patient's abdomen was prepped with ChloraPrep allowed to dry and draped in usual sterile manner. An appropriate timeout was performed. The area below the umbilicus was infiltrated with quarter percent  Marcaine. A small incision was made with a 15 blade knife. The incision was carried down through the subcutaneous tissue bluntly with a hemostat and [employer] retractors. The linea alba was identified. The linea alba was incised with a 15 blade knife and each side was grasped with Coker clamps. The preperitoneal space was then probed with a hemostat until the peritoneum was opened and access was gained to the abdominal cavity. A 0 Vicryl pursestring stitch was placed in the fascia surrounding the opening. A Hassan cannula was then placed through the opening and anchored in place with the previously placed Vicryl purse string stitch. The abdomen was insufflated with carbon dioxide without difficulty. A laparoscope was inserted through the [facility] cannula in the right upper quadrant was inspected. Next the epigastric region was infiltrated with % Marcaine. A small incision was made  with a 15 blade knife. A 5 mm port was placed bluntly through this incision into the abdominal cavity under direct vision. Next 2 sites were chosen laterally on the right side of the abdomen for placement of 5 mm ports. Each of these areas was infiltrated with quarter percent Marcaine. Small stab incisions were made with a 15 blade knife. 5 mm ports were then placed bluntly through these incisions into the abdominal cavity under direct vision without difficulty. A blunt grasper was placed through the lateralmost 5 mm port and used to grasp the dome of the gallbladder and elevated anteriorly and superiorly. Another blunt grasper was placed through the other 5 mm port and used to retract the body and neck of the gallbladder. A dissector was placed through the epigastric port and using the electrocautery the peritoneal reflection at the gallbladder neck was opened. Blunt dissection was then carried out in this area until the gallbladder neck-cystic duct junction was readily identified and a good critical window was created. A single clip was placed on the gallbladder neck. A small  ductotomy was made just below the clip with laparoscopic scissors. A 14-gauge Angiocath was then placed through the anterior abdominal wall under direct vision. A Reddick cholangiogram catheter was then placed through the Angiocath and flushed. The catheter was then placed in the cystic duct and anchored in place with a clip. A cholangiogram could not be obtained because the c-arm would not function properly. The anatomy was well visualized and the liver functions were normal. The anchoring clip and catheters were then removed from the patient. 3  clips were placed proximally on the cystic duct and the duct was divided between the 2 sets of clips. Posterior to this the cystic artery was identified and again dissected bluntly in a circumferential manner until a good window  was created. 2 clips were placed proximally and one distally on the  artery and the artery was divided between the 2 sets of clips. Next a laparoscopic hook cautery device was used to separate the gallbladder from the liver bed. Prior to completely detaching the gallbladder from the liver bed the liver bed was inspected and several small bleeding points were coagulated with the electrocautery until the area was completely hemostatic. A small area of oozing was controlled with surgicell.  The gallbladder was then detached the rest of it from the liver bed without difficulty. A laparoscopic bag was inserted through the hassan port. The laparoscope was moved to the epigastric port. The gallbladder was placed within the bag and the bag was sealed.  The bag with the gallbladder was then removed with the [facility] cannula through the infraumbilical port without difficulty. The fascial defect was then closed with the previously placed Vicryl pursestring stitch as well as with another figure-of-eight 0 Vicryl stitch. The liver bed was inspected again and found to be hemostatic. The abdomen was irrigated with copious amounts of saline until the effluent was clear. The ports were then removed under direct vision without difficulty and were found to be hemostatic. The gas was allowed to escape. The skin incisions were all closed with interrupted 4-0 Monocryl subcuticular stitches. Dermabond dressings were applied. The patient tolerated the procedure well. At the end of the case all needle sponge and instrument counts were correct. The patient was then awakened and taken to recovery in stable condition  PLAN OF CARE: Discharge to home after PACU  PATIENT DISPOSITION:  PACU - hemodynamically stable.   Delay start of Pharmacological VTE agent (>24hrs) due to surgical blood loss or risk of bleeding: not applicable

## 2018-02-28 NOTE — Anesthesia Preprocedure Evaluation (Addendum)
Anesthesia Evaluation  Patient identified by MRN, date of birth, ID band Patient awake    Reviewed: Allergy & Precautions, H&P , NPO status , Patient's Chart, lab work & pertinent test results  Airway Mallampati: III  TM Distance: >3 FB Neck ROM: full    Dental  (+) Poor Dentition, Chipped, Missing   Pulmonary neg pulmonary ROS, shortness of breath, COPD (wears O2 while sleeping),  COPD inhaler and oxygen dependent, Current Smoker (last cigarette this morning),   Scattered wheeze.  Pt will use her albuterol inhaler prior to surgery   + wheezing      Cardiovascular hypertension, negative cardio ROS  + Valvular Problems/Murmurs AI  Rhythm:regular Rate:Normal     Neuro/Psych  Headaches, PSYCHIATRIC DISORDERS Anxiety Depression negative neurological ROS  negative psych ROS   GI/Hepatic negative GI ROS, Neg liver ROS, GERD  Controlled,  Endo/Other  negative endocrine ROSHypothyroidism   Renal/GU Renal diseasenegative Renal ROS  negative genitourinary   Musculoskeletal   Abdominal   Peds  Hematology negative hematology ROS (+)   Anesthesia Other Findings Past Medical History: No date: Anxiety No date: Aortic regurgitation No date: Back pain     Comment:  SCIATICA No date: CHF (congestive heart failure) (HCC) No date: COPD (chronic obstructive pulmonary disease) (HCC) No date: Depression No date: Dyspnea     Comment:  DOE No date: Gall stones No date: GERD (gastroesophageal reflux disease) No date: Headache No date: Hypertension No date: Hypothyroidism No date: Kidney atrophy No date: Oxygen deficit     Comment:  2L/ HS No date: Pneumonia No date: Thyroid disease No date: Vertigo  Past Surgical History: No date: CHOLECYSTECTOMY No date: NO PAST SURGERIES  BMI    Body Mass Index:  35.66 kg/m      Reproductive/Obstetrics negative OB ROS                            Anesthesia  Physical Anesthesia Plan  ASA: III  Anesthesia Plan: General and General ETT   Post-op Pain Management:    Induction:   PONV Risk Score and Plan: Ondansetron, Dexamethasone and Midazolam  Airway Management Planned:   Additional Equipment:   Intra-op Plan:   Post-operative Plan:   Informed Consent: I have reviewed the patients History and Physical, chart, labs and discussed the procedure including the risks, benefits and alternatives for the proposed anesthesia with the patient or authorized representative who has indicated his/her understanding and acceptance.   Dental Advisory Given  Plan Discussed with: Anesthesiologist, CRNA and Surgeon  Anesthesia Plan Comments:        Anesthesia Quick Evaluation

## 2018-02-28 NOTE — H&P (Signed)
Grace Fox  Location: [place] Office Patient #: [id number] DOB: August 28, 1970 Divorced / Language: Grace Fox / Race: White Female   History of Present Illness  The patient is a 47 year old female who presents with abdominal pain. we are asked to see the patient in consultation by Dr. Humphrey Rolls to evaluate her for gallstones. The patient is a 47 year old white female who is been experiencing upper abdominal pain for the last 3 years. She says she has known she is had gallstones for that period of time. Over the last several months the pain has become more constant and severe in nature. The pain is associated with nausea but no vomiting. A recent ultrasound did show stones in the gallbladder but no gallbladder wall thickening or ductal dilatation. Her liver functions were normal. Her white count was normal. She does smoke about a half pack of cigarettes a day. She also has COPD and anxiety. She also has aortic regurgitation which is followed by Dr. Humphrey Rolls. He has verbally cleared her for surgery.   Past Surgical History  No pertinent past surgical history   Diagnostic Studies History  Colonoscopy  never Mammogram  within last year Pap Smear  1-5 years ago  Allergies  No Known Drug Allergies   Medication History  ClonazePAM (0.5MG  Tablet, Oral) Active. Ezetimibe (10MG  Tablet, Oral) Active. BuPROPion HCl ER (XL) (150MG  Tablet ER 24HR, Oral) Active. Cetirizine HCl (10MG  Tablet, Oral) Active. Famotidine (20MG  Tablet, Oral) Active. Furosemide (20MG  Tablet, Oral) Active. Potassium Chloride Crys ER (20MEQ Tablet ER, Oral) Active. Symbicort (160-4.5MCG/ACT Aerosol, Inhalation) Active. Simvastatin (40MG  Tablet, Oral) Active. Levothyroxine Sodium (25MCG Tablet, Oral) Active. Medications Reconciled  Social History  Caffeine use  Carbonated beverages. No alcohol use  No drug use  Tobacco use  Current every day smoker.  Family History  Heart Disease  Father,  Mother. Heart disease in female family member before age 42  Heart disease in female family member before age 65  Hypertension  Father. Thyroid problems  Mother.  Pregnancy / Birth History  Age at menarche  62 years. Contraceptive History  Intrauterine device. Gravida  8 Irregular periods  Maternal age  40-20 Para  49  Other Problems  Anxiety Disorder  Back Pain  Cholelithiasis  Chronic Obstructive Lung Disease  Congestive Heart Failure  Depression  Diabetes Mellitus  Gastroesophageal Reflux Disease  Home Oxygen Use  Hypercholesterolemia  Thyroid Disease     Review of Systems  General Present- Appetite Loss and Fatigue. Not Present- Chills, Fever, Night Sweats, Weight Gain and Weight Loss. Skin Present- Dryness. Not Present- Change in Wart/Mole, Hives, Jaundice, New Lesions, Non-Healing Wounds, Rash and Ulcer. HEENT Present- Ringing in the Ears and Seasonal Allergies. Not Present- Earache, Hearing Loss, Hoarseness, Nose Bleed, Oral Ulcers, Sinus Pain, Sore Throat, Visual Disturbances, Wears glasses/contact lenses and Yellow Eyes. Respiratory Present- Difficulty Breathing and Snoring. Not Present- Bloody sputum, Chronic Cough and Wheezing. Breast Not Present- Breast Mass, Breast Pain, Nipple Discharge and Skin Changes. Cardiovascular Present- Chest Pain and Leg Cramps. Not Present- Difficulty Breathing Lying Down, Palpitations, Rapid Heart Rate, Shortness of Breath and Swelling of Extremities. Gastrointestinal Present- Abdominal Pain, Bloating, Constipation, Excessive gas, Gets full quickly at meals, Indigestion and Nausea. Not Present- Bloody Stool, Change in Bowel Habits, Chronic diarrhea, Difficulty Swallowing, Hemorrhoids, Rectal Pain and Vomiting. Female Genitourinary Not Present- Frequency, Nocturia, Painful Urination, Pelvic Pain and Urgency. Musculoskeletal Present- Back Pain, Joint Pain and Joint Stiffness. Not Present- Muscle Pain, Muscle Weakness and  Swelling of Extremities. Neurological  Present- Headaches. Not Present- Decreased Memory, Fainting, Numbness, Seizures, Tingling, Tremor, Trouble walking and Weakness. Psychiatric Present- Anxiety, Change in Sleep Pattern, Depression and Fearful. Not Present- Bipolar and Frequent crying. Endocrine Present- Heat Intolerance. Not Present- Cold Intolerance, Excessive Hunger, Hair Changes, Hot flashes and New Diabetes. Hematology Present- Easy Bruising. Not Present- Blood Thinners, Excessive bleeding, Gland problems, HIV and Persistent Infections.  Vitals Weight: 212.6 lb Height: 65in Body Surface Area: 2.03 m Body Mass Index: 35.38 kg/m  Pulse: 91 (Regular)  BP: 110/64 (Sitting, Left Arm, Standard)       Physical Exam  General Mental Status-Alert. General Appearance-Consistent with stated age. Hydration-Well hydrated. Voice-Normal.  Head and Neck Head-normocephalic, atraumatic with no lesions or palpable masses. Trachea-midline. Thyroid Gland Characteristics - normal size and consistency.  Eye Eyeball - Bilateral-Extraocular movements intact. Sclera/Conjunctiva - Bilateral-No scleral icterus.  Chest and Lung Exam Chest and lung exam reveals -quiet, even and easy respiratory effort with no use of accessory muscles and on auscultation, normal breath sounds, no adventitious sounds and normal vocal resonance. Inspection Chest Wall - Normal. Back - normal.  Cardiovascular Cardiovascular examination reveals -normal heart sounds, regular rate and rhythm with no murmurs and normal pedal pulses bilaterally.  Abdomen Note: the abdomen is soft. There is moderate tenderness to palpation in the epigastric and right upper quadrant region. There is no palpable mass. There is no obvious hernia.   Neurologic Neurologic evaluation reveals -alert and oriented x 3 with no impairment of recent or remote memory. Mental  Status-Normal.  Musculoskeletal Normal Exam - Left-Upper Extremity Strength Normal and Lower Extremity Strength Normal. Normal Exam - Right-Upper Extremity Strength Normal and Lower Extremity Strength Normal.  Lymphatic Head & Neck  General Head & Neck Lymphatics: Bilateral - Description - Normal. Axillary  General Axillary Region: Bilateral - Description - Normal. Tenderness - Non Tender. Femoral & Inguinal  Generalized Femoral & Inguinal Lymphatics: Bilateral - Description - Normal. Tenderness - Non Tender.    Assessment & Plan  GALLSTONES (K80.20) Impression: the patient appears to have symptomatic gallstones. Because of the risks of further painful episodes and possible pancreatitis I think she would benefit from having her gallbladder removed. She would also like to have this done. I have discussed with her in detail the risks and benefits of the operation as well as some of the technical aspects including the risk of common duct injury and she understands and wishes to proceed. I will plan for a laparoscopic cholecystectomy with intraoperative cholangiogram. given her history of aortic regurgitation I will also obtain cardiac clearance from Dr. Humphrey Rolls prior to scheduling Current Plans Pt Education - Gallstones: discussed with patient and provided information.

## 2018-02-28 NOTE — Anesthesia Procedure Notes (Signed)
Procedure Name: Intubation Performed by: Lance Muss, CRNA Pre-anesthesia Checklist: Patient identified, Patient being monitored, Timeout performed, Emergency Drugs available and Suction available Patient Re-evaluated:Patient Re-evaluated prior to induction Oxygen Delivery Method: Circle system utilized Preoxygenation: Pre-oxygenation with 100% oxygen Induction Type: IV induction Ventilation: Mask ventilation without difficulty Laryngoscope Size: Mac and 3 Grade View: Grade I Tube type: Oral Tube size: 7.0 mm Number of attempts: 1 Airway Equipment and Method: Stylet Placement Confirmation: ETT inserted through vocal cords under direct vision,  positive ETCO2 and breath sounds checked- equal and bilateral Secured at: 19 cm Tube secured with: Tape Dental Injury: Teeth and Oropharynx as per pre-operative assessment  Comments: Very poor dentition

## 2018-02-28 NOTE — OR Nursing (Signed)
Dr. Marlou Starks notified of request by patient to be admitted.States he  will complete orders with home meds when after patient admission to floor.

## 2018-03-01 ENCOUNTER — Encounter: Payer: Self-pay | Admitting: General Surgery

## 2018-03-01 DIAGNOSIS — K8010 Calculus of gallbladder with chronic cholecystitis without obstruction: Secondary | ICD-10-CM | POA: Diagnosis not present

## 2018-03-01 NOTE — Discharge Summary (Signed)
Physician Discharge Summary  Patient ID: KAFI DOTTER MRN: [id number] DOB/AGE: [birth year] 47 y.o.  Admit date: 02/28/2018 Discharge date: 03/01/2018  Admission Diagnoses:  Discharge Diagnoses:  Active Problems:   S/P laparoscopic cholecystectomy   Gallstones   Discharged Condition: good  Hospital Course: the pt underwent lap chole. She tolerated surgery well. In pacu she had some issues with pain control so she stayed overnight. She is now ready for discharge home  Consults: None  Significant Diagnostic Studies: none  Treatments: surgery: as above  Discharge Exam: Blood pressure 104/62, pulse 75, temperature 98.3 F (36.8 C), temperature source Oral, resp. rate 20, height 5\' 5"  (1.651 m), weight 97.2 kg, SpO2 97 %. General appearance: alert and cooperative Resp: clear to auscultation bilaterally Cardio: regular rate and rhythm GI: focal tenderness RUQ  Disposition: Discharge disposition: 01-Home or Self Care       Discharge Instructions    Call MD for:  difficulty breathing, headache or visual disturbances   Complete by:  As directed    Call MD for:  difficulty breathing, headache or visual disturbances   Complete by:  As directed    Call MD for:  extreme fatigue   Complete by:  As directed    Call MD for:  extreme fatigue   Complete by:  As directed    Call MD for:  hives   Complete by:  As directed    Call MD for:  hives   Complete by:  As directed    Call MD for:  persistant dizziness or light-headedness   Complete by:  As directed    Call MD for:  persistant dizziness or light-headedness   Complete by:  As directed    Call MD for:  persistant nausea and vomiting   Complete by:  As directed    Call MD for:  persistant nausea and vomiting   Complete by:  As directed    Call MD for:  redness, tenderness, or signs of infection (pain, swelling, redness, odor or green/yellow discharge around incision site)   Complete by:  As directed    Call MD for:   redness, tenderness, or signs of infection (pain, swelling, redness, odor or green/yellow discharge around incision site)   Complete by:  As directed    Call MD for:  severe uncontrolled pain   Complete by:  As directed    Call MD for:  severe uncontrolled pain   Complete by:  As directed    Call MD for:  temperature >100.4   Complete by:  As directed    Call MD for:  temperature >100.4   Complete by:  As directed    Diet - low sodium heart healthy   Complete by:  As directed    Diet - low sodium heart healthy   Complete by:  As directed    Discharge instructions   Complete by:  As directed    May shower. Low fat diet. No heavy lifting   Discharge instructions   Complete by:  As directed    May shower. Low fat diet. No heavy lifting   Increase activity slowly   Complete by:  As directed    Increase activity slowly   Complete by:  As directed    No wound care   Complete by:  As directed    No wound care   Complete by:  As directed      Allergies as of 03/01/2018      Reactions  Lactose Intolerance (gi)       Medication List    STOP taking these medications   ondansetron 4 MG disintegrating tablet Commonly known as:  ZOFRAN-ODT     TAKE these medications   albuterol 108 (90 Base) MCG/ACT inhaler Commonly known as:  PROVENTIL HFA;VENTOLIN HFA Inhale 2 puffs into the lungs every 4 (four) hours as needed for wheezing or shortness of breath.   budesonide-formoterol 160-4.5 MCG/ACT inhaler Commonly known as:  SYMBICORT Inhale 2 puffs into the lungs 2 (two) times daily.   buPROPion 150 MG 24 hr tablet Commonly known as:  WELLBUTRIN XL Take 150 mg by mouth daily.   cetirizine 10 MG tablet Commonly known as:  ZYRTEC Take 10 mg by mouth daily.   clonazePAM 0.5 MG tablet Commonly known as:  KLONOPIN Take 0.5 mg 2 (two) times daily as needed by mouth for anxiety.   ezetimibe 10 MG tablet Commonly known as:  ZETIA Take 10 mg by mouth daily.   famotidine 20 MG  tablet Commonly known as:  PEPCID Take 20 mg by mouth 2 (two) times daily.   ferrous sulfate 325 (65 FE) MG tablet Take 325 mg by mouth daily with breakfast.   furosemide 20 MG tablet Commonly known as:  LASIX Take 40 mg by mouth daily.   HYDROcodone-acetaminophen 5-325 MG tablet Commonly known as:  NORCO/VICODIN Take 1-2 tablets by mouth every 6 (six) hours as needed for moderate pain or severe pain.   levothyroxine 25 MCG tablet Commonly known as:  SYNTHROID, LEVOTHROID Take 25 mcg daily before breakfast by mouth.   meclizine 25 MG tablet Commonly known as:  ANTIVERT Take 25 mg by mouth 2 (two) times daily as needed for dizziness.   potassium chloride SA 20 MEQ tablet Commonly known as:  K-DUR,KLOR-CON Take 20 mEq by mouth daily.   simvastatin 40 MG tablet Commonly known as:  ZOCOR Take 40 mg by mouth daily.   VITAMIN D (ERGOCALCIFEROL) PO Take 5,000 Units by mouth daily.      Follow-up Information    Jovita Kussmaul, MD. Go on 03/17/2018.   Specialty:  General Surgery Why:  at 1:30PM in [facility] information: [address] [place] [address] [phone]           Signed: Merrie Roof 03/01/2018, 11:43 AM

## 2018-03-01 NOTE — Progress Notes (Signed)
MD order to discharge patient home. Discharge instructions, prescription and follow up appointment was reviewed with patient. Questions were encouraged and answered. Will call for wheelchair when her transportation arrives.

## 2018-03-01 NOTE — Progress Notes (Signed)
1 Day Post-Op   Subjective/Chief Complaint: Complains of soreness but otherwise ok   Objective: Vital signs in last 24 hours: Temp:  [97.6 F (36.4 C)-98.7 F (37.1 C)] 98.3 F (36.8 C) (08/24 0444) Pulse Rate:  [66-88] 75 (08/24 1046) Resp:  [13-26] 20 (08/24 0444) BP: (98-140)/(51-80) 104/62 (08/24 1046) SpO2:  [92 %-100 %] 97 % (08/24 0444) Weight:  [97.2 kg] 97.2 kg (08/23 1645) Last BM Date: 02/28/18  Intake/Output from previous day: 08/23 0701 - 08/24 0700 In: 1273.4 [P.O.:150; I.V.:1123.4] Out: 15 [Blood:15] Intake/Output this shift: No intake/output data recorded.  General appearance: alert and cooperative Resp: clear to auscultation bilaterally Cardio: regular rate and rhythm GI: soft, focally tender RUQ as expected  Lab Results:  No results for input(s): WBC, HGB, HCT, PLT in the last 72 hours. BMET No results for input(s): NA, K, CL, CO2, GLUCOSE, BUN, CREATININE, CALCIUM in the last 72 hours. PT/INR No results for input(s): LABPROT, INR in the last 72 hours. ABG No results for input(s): PHART, HCO3 in the last 72 hours.  Invalid input(s): PCO2, PO2  Studies/Results: No results found.  Anti-infectives: Anti-infectives (From admission, onward)   Start     Dose/Rate Route Frequency Ordered Stop   02/28/18 1011  ceFAZolin (ANCEF) 2-4 GM/100ML-% IVPB    Note to Pharmacy:  Dewayne Hatch   : cabinet override      02/28/18 1011 02/28/18 1305   02/28/18 0600  ceFAZolin (ANCEF) IVPB 2g/100 mL premix     2 g 200 mL/hr over 30 Minutes Intravenous On call to O.R. 02/27/18 2136 02/28/18 1335      Assessment/Plan: s/p Procedure(s): LAPAROSCOPIC CHOLECYSTECTOMY WITH INTRAOPERATIVE CHOLANGIOGRAM (N/A) Advance diet Discharge  LOS: 0 days    TOTH III,PAUL S 03/01/2018

## 2018-03-03 NOTE — Anesthesia Postprocedure Evaluation (Signed)
Anesthesia Post Note  Patient: Grace Fox  Procedure(s) Performed: LAPAROSCOPIC CHOLECYSTECTOMY WITH INTRAOPERATIVE CHOLANGIOGRAM (N/A Abdomen)  Patient location during evaluation: PACU Anesthesia Type: General Level of consciousness: awake and alert Pain management: pain level controlled Vital Signs Assessment: post-procedure vital signs reviewed and stable Respiratory status: spontaneous breathing, nonlabored ventilation and respiratory function stable Cardiovascular status: blood pressure returned to baseline and stable Postop Assessment: no apparent nausea or vomiting Anesthetic complications: no     Last Vitals:  Vitals:   03/01/18 1046 03/01/18 1158  BP: 104/62 106/75  Pulse: 75 78  Resp:  18  Temp:  36.4 C  SpO2:  98%    Last Pain:  Vitals:   03/01/18 1158  TempSrc: Oral  PainSc:                  Durenda Hurt

## 2018-03-04 ENCOUNTER — Telehealth: Payer: Self-pay

## 2018-03-04 LAB — SURGICAL PATHOLOGY

## 2018-03-04 NOTE — Telephone Encounter (Signed)
Looking to see if patient's cardiac clearance had been faxed back to [place], I saw that she had her surgery done on 02/28/2018 with Dr. Marlou Starks. No need to receive her cardiac clearance.

## 2018-04-25 ENCOUNTER — Emergency Department: Payer: Medicaid Other

## 2018-04-25 ENCOUNTER — Emergency Department
Admission: EM | Admit: 2018-04-25 | Discharge: 2018-04-25 | Disposition: A | Discharge status: Home / Self Care | Payer: Medicaid Other | Attending: Emergency Medicine | Admitting: Emergency Medicine

## 2018-04-25 ENCOUNTER — Other Ambulatory Visit: Payer: Self-pay

## 2018-04-25 ENCOUNTER — Encounter: Payer: Self-pay | Admitting: Emergency Medicine

## 2018-04-25 DIAGNOSIS — F17210 Nicotine dependence, cigarettes, uncomplicated: Secondary | ICD-10-CM | POA: Insufficient documentation

## 2018-04-25 DIAGNOSIS — Z3202 Encounter for pregnancy test, result negative: Secondary | ICD-10-CM | POA: Insufficient documentation

## 2018-04-25 DIAGNOSIS — R109 Unspecified abdominal pain: Secondary | ICD-10-CM

## 2018-04-25 DIAGNOSIS — N390 Urinary tract infection, site not specified: Secondary | ICD-10-CM | POA: Diagnosis not present

## 2018-04-25 DIAGNOSIS — I509 Heart failure, unspecified: Secondary | ICD-10-CM | POA: Diagnosis not present

## 2018-04-25 DIAGNOSIS — Z79899 Other long term (current) drug therapy: Secondary | ICD-10-CM | POA: Insufficient documentation

## 2018-04-25 DIAGNOSIS — E039 Hypothyroidism, unspecified: Secondary | ICD-10-CM | POA: Insufficient documentation

## 2018-04-25 DIAGNOSIS — J449 Chronic obstructive pulmonary disease, unspecified: Secondary | ICD-10-CM | POA: Diagnosis not present

## 2018-04-25 DIAGNOSIS — I110 Hypertensive heart disease with heart failure: Secondary | ICD-10-CM | POA: Diagnosis not present

## 2018-04-25 LAB — POCT PREGNANCY, URINE: Preg Test, Ur: NEGATIVE

## 2018-04-25 LAB — URINALYSIS, COMPLETE (UACMP) WITH MICROSCOPIC
BILIRUBIN URINE: NEGATIVE
Bacteria, UA: NONE SEEN
Glucose, UA: NEGATIVE mg/dL
Ketones, ur: NEGATIVE mg/dL
NITRITE: NEGATIVE
PH: 5 (ref 5.0–8.0)
Protein, ur: NEGATIVE mg/dL
Specific Gravity, Urine: 1.023 (ref 1.005–1.030)

## 2018-04-25 LAB — BASIC METABOLIC PANEL
Anion gap: 10 (ref 5–15)
BUN: 20 mg/dL (ref 6–20)
CO2: 25 mmol/L (ref 22–32)
CREATININE: 1.49 mg/dL — AB (ref 0.44–1.00)
Calcium: 9.2 mg/dL (ref 8.9–10.3)
Chloride: 105 mmol/L (ref 98–111)
GFR calc non Af Amer: 41 mL/min — ABNORMAL LOW (ref >60)
GFR, EST AFRICAN AMERICAN: 48 mL/min — AB (ref >60)
Glucose, Bld: 101 mg/dL — ABNORMAL HIGH (ref 70–99)
POTASSIUM: 3.8 mmol/L (ref 3.5–5.1)
SODIUM: 140 mmol/L (ref 135–145)

## 2018-04-25 LAB — CBC
HCT: 39.7 % (ref 36.0–46.0)
HEMOGLOBIN: 13.2 g/dL (ref 12.0–15.0)
MCH: 31.9 pg (ref 26.0–34.0)
MCHC: 33.2 g/dL (ref 30.0–36.0)
MCV: 95.9 fL (ref 80.0–100.0)
NRBC: 0 % (ref 0.0–0.2)
Platelets: 277 10*3/uL (ref 150–400)
RBC: 4.14 MIL/uL (ref 3.87–5.11)
RDW: 13.1 % (ref 11.5–15.5)
WBC: 11.2 10*3/uL — AB (ref 4.0–10.5)

## 2018-04-25 MED ORDER — CEPHALEXIN 500 MG PO CAPS: 500.0000 mg | ORAL_CAPSULE | Freq: Two times a day (BID) | ORAL | 0 refills | Status: DC

## 2018-04-25 MED ORDER — TRAMADOL HCL 50 MG PO TABS: 50.0000 mg | ORAL_TABLET | Freq: Four times a day (QID) | ORAL | 0 refills | Status: DC | PRN

## 2018-04-25 MED ORDER — CEPHALEXIN 500 MG PO CAPS
ORAL_CAPSULE | ORAL | Status: AC
  Filled 2018-04-25: qty 1

## 2018-04-25 MED ORDER — CEPHALEXIN 500 MG PO CAPS
500.0000 mg | ORAL_CAPSULE | Freq: Once | ORAL | Status: AC
  Administered 2018-04-25: 500 mg via ORAL

## 2018-04-25 MED ORDER — ONDANSETRON 4 MG PO TBDP
4.0000 mg | ORAL_TABLET | Freq: Once | ORAL | Status: AC
  Administered 2018-04-25: 4 mg via ORAL
  Filled 2018-04-25: qty 1

## 2018-04-25 MED ORDER — KETOROLAC TROMETHAMINE 30 MG/ML IJ SOLN
30.0000 mg | Freq: Once | INTRAMUSCULAR | Status: AC
  Administered 2018-04-25: 30 mg via INTRAMUSCULAR
  Filled 2018-04-25: qty 1

## 2018-04-25 NOTE — ED Notes (Signed)
Pt asking for something to drink. Advised to wait for nausea meds to work and then we will try ice chips.

## 2018-04-25 NOTE — ED Triage Notes (Signed)
Pt reports left flank pain for about 2 days, nausea, chills, history of kidney stones. Pt reports takes lasix for CHF.

## 2018-04-25 NOTE — ED Provider Notes (Signed)
[facility] Emergency Department Provider Note   ____________________________________________    I have reviewed the triage vital signs and the nursing notes.   HISTORY  Chief Complaint Flank Pain (left side )     HPI LIDIA CLAVIJO is a 47 y.o. female who presents with complaints of left-sided flank pain.  Patient reports is been ongoing for about a day and a half.  She reports it feels similar to prior kidney stones.  She is having some nausea.  Denies fevers but has had some chills.  Denies dysuria.  She reports the pain is sharp, intermittent but does not radiate.  Has not taken anything for this  Past Medical History:  Diagnosis Date  . Anxiety   . Aortic regurgitation   . Back pain    SCIATICA  . CHF (congestive heart failure) ([place])   . COPD (chronic obstructive pulmonary disease) ([place])   . Depression   . Dyspnea    DOE  . Gall stones   . GERD (gastroesophageal reflux disease)   . Headache   . Hypertension   . Hypothyroidism   . Kidney atrophy   . Oxygen deficit    2L/ HS  . Pneumonia   . Thyroid disease   . Vertigo     Patient Active Problem List   Diagnosis Date Noted  . S/P laparoscopic cholecystectomy 02/28/2018  . Gallstones 02/28/2018  . Malnutrition of moderate degree 06/30/2015  . RUQ abdominal pain   . Acute cholecystitis 06/28/2015  . Allergic reaction 05/26/2015    Past Surgical History:  Procedure Laterality Date  . CHOLECYSTECTOMY    . CHOLECYSTECTOMY N/A 02/28/2018   Procedure: LAPAROSCOPIC CHOLECYSTECTOMY WITH INTRAOPERATIVE CHOLANGIOGRAM;  Surgeon: Jovita Kussmaul, MD;  Location: [facility] ORS;  Service: General;  Laterality: N/A;  . NO PAST SURGERIES      Prior to Admission medications   Medication Sig Start Date End Date Taking? Authorizing Provider  albuterol (PROVENTIL HFA;VENTOLIN HFA) 108 (90 BASE) MCG/ACT inhaler Inhale 2 puffs into the lungs every 4 (four) hours as needed for wheezing or shortness  of breath.    [provider]  budesonide-formoterol (SYMBICORT) 160-4.5 MCG/ACT inhaler Inhale 2 puffs into the lungs 2 (two) times daily.    [provider]  buPROPion (WELLBUTRIN XL) 150 MG 24 hr tablet Take 150 mg by mouth daily.  11/14/17   [provider]  cephALEXin (KEFLEX) 500 MG capsule Take 1 capsule (500 mg total) by mouth 2 (two) times daily. 04/25/18   Lavonia Drafts, MD  cetirizine (ZYRTEC) 10 MG tablet Take 10 mg by mouth daily.  11/09/17   [provider]  clonazePAM (KLONOPIN) 0.5 MG tablet Take 0.5 mg 2 (two) times daily as needed by mouth for anxiety.    [provider]  ezetimibe (ZETIA) 10 MG tablet Take 10 mg by mouth daily.  11/13/17   [provider]  famotidine (PEPCID) 20 MG tablet Take 20 mg by mouth 2 (two) times daily.    [provider]  ferrous sulfate 325 (65 FE) MG tablet Take 325 mg by mouth daily with breakfast.    [provider]  furosemide (LASIX) 20 MG tablet Take 40 mg by mouth daily.     [provider]  HYDROcodone-acetaminophen (NORCO/VICODIN) 5-325 MG tablet Take 1-2 tablets by mouth every 6 (six) hours as needed for moderate pain or severe pain. 02/28/18   Jovita Kussmaul, MD  levothyroxine (SYNTHROID, LEVOTHROID) 25 MCG tablet  Take 25 mcg daily before breakfast by mouth.    [provider]  meclizine (ANTIVERT) 25 MG tablet Take 25 mg by mouth 2 (two) times daily as needed for dizziness.  11/09/17   [provider]  potassium chloride SA (K-DUR,KLOR-CON) 20 MEQ tablet Take 20 mEq by mouth daily.     [provider]  simvastatin (ZOCOR) 40 MG tablet Take 40 mg by mouth daily.     [provider]  VITAMIN D, ERGOCALCIFEROL, PO Take 5,000 Units by mouth daily.     [provider]     Allergies Lactose intolerance (gi)  Family History  Problem Relation Age of Onset  . Diabetes Other   . Heart failure Other   . Thyroid disease Mother     . Cancer Father   . Hypertension Maternal Grandfather   . Stroke Maternal Grandfather   . Cancer Paternal Grandmother   . Breast cancer Neg Hx     Social History Social History   Tobacco Use  . Smoking status: Current Every Day Smoker    Packs/day: 1.00    Types: Cigarettes  . Smokeless tobacco: Never Used  Substance Use Topics  . Alcohol use: No  . Drug use: No    Review of Systems  Constitutional: No fever Eyes: No visual changes.  ENT: No sore throat. Cardiovascular: Denies chest pain. Respiratory: Denies shortness of breath. Gastrointestinal: As above Genitourinary: As above Musculoskeletal: Negative for back pain. Skin: Negative for rash. Neurological: Negative for headaches   ____________________________________________   PHYSICAL EXAM:  VITAL SIGNS: ED Triage Vitals  Enc Vitals Group     BP 04/25/18 1855 (!) 149/65     Pulse Rate 04/25/18 1855 94     Resp --      Temp 04/25/18 1855 98.2 F (36.8 C)     Temp Source 04/25/18 1855 Oral     SpO2 04/25/18 1855 100 %     Weight 04/25/18 1856 97.1 kg (214 lb)     Height 04/25/18 1856 1.651 m (5\' 5" )     Head Circumference --      Peak Flow --      Pain Score 04/25/18 1906 8     Pain Loc --      Pain Edu? --      Excl. in [place]? --     Constitutional: Alert and oriented.  Eyes: Conjunctivae are normal.   Nose: No congestion/rhinnorhea. Mouth/Throat: Mucous membranes are moist.    Cardiovascular: Normal rate, regular rhythm. Grossly normal heart sounds.  Good peripheral circulation. Respiratory: Normal respiratory effort.  No retractions. Lungs CTAB. Gastrointestinal: Soft and nontender. No distention.  No CVA tenderness. Genitourinary: deferred Musculoskeletal: No lower extremity tenderness nor edema.  Warm and well perfused Neurologic:  Normal speech and language. No gross focal neurologic deficits are appreciated.  Skin:  Skin is warm, dry and intact. No rash noted. Psychiatric: Mood and affect  are normal. Speech and behavior are normal.  ____________________________________________   LABS (all labs ordered are listed, but only abnormal results are displayed)  Labs Reviewed  URINALYSIS, COMPLETE (UACMP) WITH MICROSCOPIC - Abnormal; Notable for the following components:      Result Value   Color, Urine YELLOW (*)    APPearance HAZY (*)    Hgb urine dipstick MODERATE (*)    Leukocytes, UA MODERATE (*)    All other components within normal limits  BASIC METABOLIC PANEL - Abnormal; Notable for the following components:   Glucose,  Bld 101 (*)    Creatinine, Ser 1.49 (*)    GFR calc non Af Amer 41 (*)    GFR calc Af Amer 48 (*)    All other components within normal limits  CBC - Abnormal; Notable for the following components:   WBC 11.2 (*)    All other components within normal limits  POC URINE PREG, ED  POCT PREGNANCY, URINE   ____________________________________________  EKG  None ____________________________________________  RADIOLOGY  CT renal stone study ____________________________________________   PROCEDURES  Procedure(s) performed: No  Procedures   Critical Care performed: No ____________________________________________   INITIAL IMPRESSION / ASSESSMENT AND PLAN / ED COURSE  Pertinent labs & imaging results that were available during my care of the patient were reviewed by me and considered in my medical decision making (see chart for details).  Patient presents with left-sided flank pain, concerning for ureterolithiasis, also UTI is a possibility.  Pending urinalysis, will give IM Toradol, Zofran  Urinalysis demonstrates leukocytes but also hemoglobin, will send for CT renal stone study  CT scan is reassuring, patient reports her pain is improved.  Urinalysis consistent with UTI.  Will treat with p.o. Keflex.  At this point appropriate for discharge with outpatient follow-up.  Return precautions discussed.  Patient is comfortable with  plan    ____________________________________________   FINAL CLINICAL IMPRESSION(S) / ED DIAGNOSES  Final diagnoses:  Lower urinary tract infectious disease  Flank pain        Note:  This document was prepared using Dragon voice recognition software and may include unintentional dictation errors.    Lavonia Drafts, MD 04/25/18 2133

## 2018-04-25 NOTE — ED Notes (Signed)
Pt to the er for pain in the left kidney and kidney shrinkage. Pt has a hx of stones. Pain started a day and a half ago. Pain is sharp in the back and the left flank.

## 2018-04-25 NOTE — ED Triage Notes (Signed)
First Nurse Note:  C/O left flank pain x 2 days.  States has history of Kidney Stones and pain is similar to stone pain.  AAOx3.  Skin warm and dry. NAD

## 2018-05-01 ENCOUNTER — Other Ambulatory Visit: Payer: Self-pay | Admitting: Family Medicine

## 2018-05-01 DIAGNOSIS — R928 Other abnormal and inconclusive findings on diagnostic imaging of breast: Secondary | ICD-10-CM

## 2018-05-28 ENCOUNTER — Ambulatory Visit
Admission: RE | Admit: 2018-05-28 | Discharge: 2018-05-28 | Disposition: A | Discharge status: Home / Self Care | Payer: Medicaid Other | Source: Ambulatory Visit | Attending: Family Medicine | Admitting: Family Medicine

## 2018-05-28 DIAGNOSIS — R928 Other abnormal and inconclusive findings on diagnostic imaging of breast: Secondary | ICD-10-CM | POA: Diagnosis present

## 2018-05-30 ENCOUNTER — Encounter: Payer: Self-pay | Admitting: Emergency Medicine

## 2018-05-30 ENCOUNTER — Emergency Department
Admission: EM | Admit: 2018-05-30 | Discharge: 2018-05-30 | Disposition: A | Discharge status: Home / Self Care | Payer: Medicaid Other | Attending: Emergency Medicine | Admitting: Emergency Medicine

## 2018-05-30 DIAGNOSIS — E039 Hypothyroidism, unspecified: Secondary | ICD-10-CM | POA: Insufficient documentation

## 2018-05-30 DIAGNOSIS — F419 Anxiety disorder, unspecified: Secondary | ICD-10-CM | POA: Insufficient documentation

## 2018-05-30 DIAGNOSIS — I110 Hypertensive heart disease with heart failure: Secondary | ICD-10-CM | POA: Insufficient documentation

## 2018-05-30 DIAGNOSIS — F17210 Nicotine dependence, cigarettes, uncomplicated: Secondary | ICD-10-CM | POA: Insufficient documentation

## 2018-05-30 DIAGNOSIS — I509 Heart failure, unspecified: Secondary | ICD-10-CM | POA: Diagnosis not present

## 2018-05-30 DIAGNOSIS — R109 Unspecified abdominal pain: Secondary | ICD-10-CM | POA: Diagnosis present

## 2018-05-30 DIAGNOSIS — Z79899 Other long term (current) drug therapy: Secondary | ICD-10-CM | POA: Insufficient documentation

## 2018-05-30 DIAGNOSIS — Z9049 Acquired absence of other specified parts of digestive tract: Secondary | ICD-10-CM | POA: Diagnosis not present

## 2018-05-30 DIAGNOSIS — R1010 Upper abdominal pain, unspecified: Secondary | ICD-10-CM | POA: Insufficient documentation

## 2018-05-30 DIAGNOSIS — J449 Chronic obstructive pulmonary disease, unspecified: Secondary | ICD-10-CM | POA: Insufficient documentation

## 2018-05-30 LAB — CBC
HEMATOCRIT: 40 % (ref 36.0–46.0)
Hemoglobin: 13 g/dL (ref 12.0–15.0)
MCH: 31.6 pg (ref 26.0–34.0)
MCHC: 32.5 g/dL (ref 30.0–36.0)
MCV: 97.1 fL (ref 80.0–100.0)
Platelets: 282 10*3/uL (ref 150–400)
RBC: 4.12 MIL/uL (ref 3.87–5.11)
RDW: 12.8 % (ref 11.5–15.5)
WBC: 8.8 10*3/uL (ref 4.0–10.5)
nRBC: 0 % (ref 0.0–0.2)

## 2018-05-30 LAB — URINALYSIS, COMPLETE (UACMP) WITH MICROSCOPIC
BACTERIA UA: NONE SEEN
BILIRUBIN URINE: NEGATIVE
Glucose, UA: NEGATIVE mg/dL
Ketones, ur: NEGATIVE mg/dL
Nitrite: NEGATIVE
Protein, ur: NEGATIVE mg/dL
SPECIFIC GRAVITY, URINE: 1.016 (ref 1.005–1.030)
pH: 6 (ref 5.0–8.0)

## 2018-05-30 LAB — COMPREHENSIVE METABOLIC PANEL
ALBUMIN: 4.1 g/dL (ref 3.5–5.0)
ALT: 25 U/L (ref 0–44)
AST: 26 U/L (ref 15–41)
Alkaline Phosphatase: 85 U/L (ref 38–126)
Anion gap: 8 (ref 5–15)
BUN: 24 mg/dL — AB (ref 6–20)
CHLORIDE: 105 mmol/L (ref 98–111)
CO2: 25 mmol/L (ref 22–32)
CREATININE: 1.59 mg/dL — AB (ref 0.44–1.00)
Calcium: 8.8 mg/dL — ABNORMAL LOW (ref 8.9–10.3)
GFR calc Af Amer: 44 mL/min — ABNORMAL LOW (ref >60)
GFR calc non Af Amer: 38 mL/min — ABNORMAL LOW (ref >60)
Glucose, Bld: 99 mg/dL (ref 70–99)
Potassium: 4.2 mmol/L (ref 3.5–5.1)
Sodium: 138 mmol/L (ref 135–145)
Total Bilirubin: 0.3 mg/dL (ref 0.3–1.2)
Total Protein: 7.3 g/dL (ref 6.5–8.1)

## 2018-05-30 LAB — PREGNANCY, URINE: PREG TEST UR: NEGATIVE

## 2018-05-30 LAB — LIPASE, BLOOD: LIPASE: 48 U/L (ref 11–51)

## 2018-05-30 MED ORDER — DICYCLOMINE HCL 10 MG PO CAPS
20.0000 mg | ORAL_CAPSULE | Freq: Once | ORAL | Status: AC
  Administered 2018-05-30: 20 mg via ORAL
  Filled 2018-05-30: qty 2

## 2018-05-30 MED ORDER — SUCRALFATE 1 G PO TABS: 1.0000 g | ORAL_TABLET | Freq: Four times a day (QID) | ORAL | 0 refills | Status: DC

## 2018-05-30 NOTE — ED Triage Notes (Signed)
Patient presents to the ED with right lower quadrant abdominal pain that radiates into right flank.  Patient reports history of left kidney infection.  Patient states, "they put me back on antibiotics but now it's hurting on my right."  Patient denies burning with urination but reports occasional sharp pains with urination.  Patient reports nausea but denies vomiting.

## 2018-05-30 NOTE — ED Provider Notes (Signed)
[facility] Emergency Department Provider Note   ____________________________________________   I have reviewed the triage vital signs and the nursing notes.   HISTORY  Chief Complaint Abdominal Pain   History limited by: Not cooperative   HPI Grace Fox is a 47 y.o. female who presents to the emergency department today because of concerns for abdominal pain.  The pain started today.  It started around lunchtime.  Is located in the upper abdomen and right upper quadrant.  Patient states it has been constant since it started.  She does have a history of somewhat frequent UTIs recently and states that when she does get her urinary tract infection she does get pain in her upper abdomen.  She is status post cholecystectomy.  She denies any fevers.  Per medical record review patient has a history of COPD, CHF  Past Medical History:  Diagnosis Date  . Anxiety   . Aortic regurgitation   . Back pain    SCIATICA  . CHF (congestive heart failure) ([place])   . COPD (chronic obstructive pulmonary disease) ([place])   . Depression   . Dyspnea    DOE  . Gall stones   . GERD (gastroesophageal reflux disease)   . Headache   . Hypertension   . Hypothyroidism   . Kidney atrophy   . Oxygen deficit    2L/ HS  . Pneumonia   . Thyroid disease   . Vertigo     Patient Active Problem List   Diagnosis Date Noted  . S/P laparoscopic cholecystectomy 02/28/2018  . Gallstones 02/28/2018  . Malnutrition of moderate degree 06/30/2015  . RUQ abdominal pain   . Acute cholecystitis 06/28/2015  . Allergic reaction 05/26/2015    Past Surgical History:  Procedure Laterality Date  . CHOLECYSTECTOMY    . CHOLECYSTECTOMY N/A 02/28/2018   Procedure: LAPAROSCOPIC CHOLECYSTECTOMY WITH INTRAOPERATIVE CHOLANGIOGRAM;  Surgeon: Jovita Kussmaul, MD;  Location: [facility] ORS;  Service: General;  Laterality: N/A;  . NO PAST SURGERIES      Prior to Admission medications   Medication  Sig Start Date End Date Taking? Authorizing Provider  albuterol (PROVENTIL HFA;VENTOLIN HFA) 108 (90 BASE) MCG/ACT inhaler Inhale 2 puffs into the lungs every 4 (four) hours as needed for wheezing or shortness of breath.    [provider]  budesonide-formoterol (SYMBICORT) 160-4.5 MCG/ACT inhaler Inhale 2 puffs into the lungs 2 (two) times daily.    [provider]  buPROPion (WELLBUTRIN XL) 150 MG 24 hr tablet Take 150 mg by mouth daily.  11/14/17   [provider]  cephALEXin (KEFLEX) 500 MG capsule Take 1 capsule (500 mg total) by mouth 2 (two) times daily. 04/25/18   Lavonia Drafts, MD  cetirizine (ZYRTEC) 10 MG tablet Take 10 mg by mouth daily.  11/09/17   [provider]  clonazePAM (KLONOPIN) 0.5 MG tablet Take 0.5 mg 2 (two) times daily as needed by mouth for anxiety.    [provider]  ezetimibe (ZETIA) 10 MG tablet Take 10 mg by mouth daily.  11/13/17   [provider]  famotidine (PEPCID) 20 MG tablet Take 20 mg by mouth 2 (two) times daily.    [provider]  ferrous sulfate 325 (65 FE) MG tablet Take 325 mg by mouth daily with breakfast.    [provider]  furosemide (LASIX) 20 MG tablet Take 40 mg by mouth daily.     [provider]  HYDROcodone-acetaminophen (NORCO/VICODIN) 5-325 MG tablet Take 1-2  tablets by mouth every 6 (six) hours as needed for moderate pain or severe pain. 02/28/18   Jovita Kussmaul, MD  levothyroxine (SYNTHROID, LEVOTHROID) 25 MCG tablet Take 25 mcg daily before breakfast by mouth.    [provider]  meclizine (ANTIVERT) 25 MG tablet Take 25 mg by mouth 2 (two) times daily as needed for dizziness.  11/09/17   [provider]  potassium chloride SA (K-DUR,KLOR-CON) 20 MEQ tablet Take 20 mEq by mouth daily.     [provider]  simvastatin (ZOCOR) 40 MG tablet Take 40 mg by mouth daily.     [provider]  traMADol (ULTRAM) 50 MG tablet Take 1 tablet (50  mg total) by mouth every 6 (six) hours as needed. 04/25/18 04/25/19  Lavonia Drafts, MD  VITAMIN D, ERGOCALCIFEROL, PO Take 5,000 Units by mouth daily.     [provider]    Allergies Lactose intolerance (gi)  Family History  Problem Relation Age of Onset  . Diabetes Other   . Heart failure Other   . Thyroid disease Mother   . Cancer Father   . Hypertension Maternal Grandfather   . Stroke Maternal Grandfather   . Cancer Paternal Grandmother   . Breast cancer Neg Hx     Social History Social History   Tobacco Use  . Smoking status: Current Every Day Smoker    Packs/day: 1.00    Types: Cigarettes  . Smokeless tobacco: Never Used  Substance Use Topics  . Alcohol use: No  . Drug use: No    Review of Systems Constitutional: No fever/chills Eyes: No visual changes. ENT: No sore throat. Cardiovascular: Denies chest pain. Respiratory: Denies shortness of breath. Gastrointestinal: Positive for upper abd pain. Genitourinary: Negative for dysuria. Musculoskeletal: Negative for back pain. Skin: Negative for rash. Neurological: Negative for headaches, focal weakness or numbness.  ____________________________________________   PHYSICAL EXAM:  VITAL SIGNS: ED Triage Vitals [05/30/18 1527]  Enc Vitals Group     BP 118/63     Pulse Rate 88     Resp 18     Temp 98 F (36.7 C)     Temp Source Oral     SpO2 98 %     Weight 214 lb (97.1 kg)     Height 5\' 5"  (1.651 m)     Head Circumference      Peak Flow      Pain Score 9   Constitutional: Alert and oriented.  Eyes: Conjunctivae are normal.  ENT      Head: Normocephalic and atraumatic.      Nose: No congestion/rhinnorhea.      Mouth/Throat: Mucous membranes are moist.      Neck: No stridor. Hematological/Lymphatic/Immunilogical: No cervical lymphadenopathy. Cardiovascular: Normal rate, regular rhythm.  No murmurs, rubs, or gallops.  Respiratory: Normal respiratory effort without tachypnea nor  retractions. Breath sounds are clear and equal bilaterally. No wheezes/rales/rhonchi. Gastrointestinal: Soft and non tender. No rebound. No guarding.  Genitourinary: Deferred Musculoskeletal: Normal range of motion in all extremities. No lower extremity edema. Neurologic:  Normal speech and language. No gross focal neurologic deficits are appreciated.  Skin:  Skin is warm, dry and intact. No rash noted. Psychiatric: Mood and affect are normal. Speech and behavior are normal. Patient exhibits appropriate insight and judgment.  ____________________________________________    LABS (pertinent positives/negatives)  Lipase 48 CBC wbc 8.8, hgb 13.0, plt 282 CMP na 138, k 4.2, gl 105, glu 99, cr 1.59 UA clear, small hgb dipstick,  small leukocytes, 0-5 rbc and wbc, bacteria none seen ____________________________________________   EKG  None  ____________________________________________    RADIOLOGY  None  ____________________________________________   PROCEDURES  Procedures  ____________________________________________   INITIAL IMPRESSION / ASSESSMENT AND PLAN / ED COURSE  Pertinent labs & imaging results that were available during my care of the patient were reviewed by me and considered in my medical decision making (see chart for details).   Patient presented because of concern for upper abdominal pain. The patient states she has had her gallbladder removed. Does complain of somewhat recurrent UTIs however urine today without any overly concerning findings. Discussed work up with patient. At this point unclear etiology but do wonder if gastritis likely. Discussed with patient that I would be prescribing sucralfate. Discussed importance of primary care follow up.   ____________________________________________   FINAL CLINICAL IMPRESSION(S) / ED DIAGNOSES  Final diagnoses:  Pain of upper abdomen     Note: This dictation was prepared with Dragon dictation. Any  transcriptional errors that result from this process are unintentional     Nance Pear, MD 05/30/18 1752

## 2018-05-30 NOTE — ED Notes (Signed)
DC instructions discussed with patient, informed her that her prescription was sent to [facility] on [place], encouraged to follow up with PCP and with GI and highlighted their numbers on DC paperwork. Pt provided work note as well. Denies any further needs at this time.

## 2018-05-30 NOTE — ED Notes (Signed)
Lab called to add urine culture, spoke with [facility].

## 2018-05-30 NOTE — ED Notes (Signed)
Pt reports right side flank and abd pain, similar to previous kidney stone pain. Pt states she has recently been treated for kidney infections recently, but continues to have sxs.

## 2018-05-30 NOTE — Discharge Instructions (Addendum)
Please seek medical attention for any high fevers, chest pain, shortness of breath, change in behavior, persistent vomiting, bloody stool or any other new or concerning symptoms.  

## 2018-06-01 LAB — URINE CULTURE: Culture: >=100000 — AB

## 2018-06-17 ENCOUNTER — Encounter: Payer: Medicaid Other | Admitting: Obstetrics and Gynecology

## 2018-06-24 ENCOUNTER — Encounter: Payer: Self-pay | Admitting: Obstetrics and Gynecology

## 2018-06-24 ENCOUNTER — Ambulatory Visit (INDEPENDENT_AMBULATORY_CARE_PROVIDER_SITE_OTHER): Payer: Medicaid Other | Admitting: Obstetrics and Gynecology

## 2018-06-24 ENCOUNTER — Other Ambulatory Visit (HOSPITAL_COMMUNITY)
Admission: RE | Admit: 2018-06-24 | Discharge: 2018-06-24 | Disposition: A | Discharge status: Home / Self Care | Payer: Medicaid Other | Source: Ambulatory Visit | Attending: Obstetrics and Gynecology | Admitting: Obstetrics and Gynecology

## 2018-06-24 VITALS — BP 117/8 | HR 111 | Ht 65.0 in | Wt 223.5 lb

## 2018-06-24 DIAGNOSIS — N871 Moderate cervical dysplasia: Secondary | ICD-10-CM | POA: Diagnosis not present

## 2018-06-24 NOTE — Progress Notes (Signed)
Referring Provider:    HPI:  Grace Fox is a 47 y.o.  [phone]  who presents today for evaluation and management of abnormal cervical cytology.    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  OB History  Gravida Para Term Preterm AB Living  6 6 5 1   6   SAB TAB Ectopic Multiple Live Births          6    # Outcome Date GA Lbr Len/2nd Weight Sex Delivery Anes PTL Lv  6 Preterm 2009   5 lb 13 oz (2.637 kg) F Vag-Spont  Y LIV  5 Term 2005   8 lb 3 oz (3.714 kg) F Vag-Spont  N LIV  4 Term 1998   8 lb 15 oz (4.054 kg) F Vag-Spont  Y LIV  3 Term 1997   8 lb 13 oz (3.997 kg) F Vag-Spont  Y LIV  2 Term 1995   8 lb 3 oz (3.714 kg) M Vag-Spont  Y LIV  1 Term 1993   7 lb 1.5 oz (3.218 kg) F Vag-Spont  Y LIV     Complications: Pre-eclampsia    Past Medical History:  Diagnosis Date  . Anxiety   . Aortic regurgitation   . Back pain    SCIATICA  . CHF (congestive heart failure) ([place])   . COPD (chronic obstructive pulmonary disease) ([place])   . Depression   . Dyspnea    DOE  . Gall stones   . GERD (gastroesophageal reflux disease)   . Headache   . Hypertension   . Hypothyroidism   . Kidney atrophy   . Oxygen deficit    2L/ HS  . Pneumonia   . Thyroid disease   . Vertigo     Past Surgical History:  Procedure Laterality Date  . CHOLECYSTECTOMY    . CHOLECYSTECTOMY N/A 02/28/2018   Procedure: LAPAROSCOPIC CHOLECYSTECTOMY WITH INTRAOPERATIVE CHOLANGIOGRAM;  Surgeon: Jovita Kussmaul, MD;  Location: [facility] ORS;  Service: General;  Laterality: N/A;  . NO PAST SURGERIES      SOCIAL HISTORY: Social History   Substance and Sexual Activity  Alcohol Use No   Social History   Substance and Sexual Activity  Drug Use No     Family History  Problem Relation Age of Onset  . Diabetes Other   . Heart failure Other   . Thyroid disease Mother   . Cancer Father   . Hypertension Maternal Grandfather   . Stroke  Maternal Grandfather   . Cancer Paternal Grandmother   . Breast cancer Neg Hx     ALLERGIES:  Lactose intolerance (gi)  She has a current medication list which includes the following prescription(s): albuterol, budesonide-formoterol, bupropion, cetirizine, clonazepam, ezetimibe, famotidine, ferrous sulfate, furosemide, levothyroxine, meclizine, potassium chloride sa, simvastatin, sucralfate, and ergocalciferol.  Physical Exam: -Vitals:  BP (!) 117/8   Pulse (!) 111   Ht 5\' 5"  (1.651 m)   Wt 223 lb 8 oz (101.4 kg)   BMI 37.19 kg/m  GEN: WD, WN, NAD.  A+ O x 3, good mood and affect. ABD:  NT, ND.  Soft, no masses.  No hernias noted.   Pelvic:   Vulva: Normal appearance.  No lesions.  Vagina: No lesions or abnormalities noted.  Support: Normal pelvic support.  Urethra No masses tenderness or scarring.  Meatus Normal size without lesions or prolapse.  Cervix: See below.  IUD strings noted at cervical  loss approximately 2 cm in length  Anus: Normal exam.  No lesions.  Perineum: Normal exam.  No lesions.        Bimanual   Uterus: Normal size.  Non-tender.  Mobile.  AV.  Adnexae: No masses.  Non-tender to palpation.  Cul-de-sac: Negative for abnormality.   PROCEDURE: 1.  Urine Pregnancy Test:  not done 2.  Colposcopy performed with 4% acetic acid after verbal consent obtained                           -Aceto-white Lesions Location(s): As above              -Biopsy performed at 1 and 10 o'clock               -ECC indicated and performed: No.     -Biopsy sites made hemostatic with pressure and Monsel's solution   -Satisfactory colposcopy: Yes.      -Evidence of Invasive cervical CA :  NO  ASSESSMENT:  Grace Fox is a 47 y.o. [phone] here for  1. CIN II (cervical intraepithelial neoplasia II)   .  PLAN: 1.  I discussed the grading system of pap smears and HPV high risk viral types.  We will discuss management after colpo results return.  No orders of the defined  types were placed in this encounter.          F/U  Return in about 10 days (around 07/04/2018) for Colpo f/u.  Jeannie Fend ,MD 06/24/2018,2:34 PM

## 2018-07-07 ENCOUNTER — Encounter: Payer: Medicaid Other | Admitting: Obstetrics and Gynecology

## 2018-07-08 ENCOUNTER — Ambulatory Visit (INDEPENDENT_AMBULATORY_CARE_PROVIDER_SITE_OTHER): Payer: Medicaid Other | Admitting: Obstetrics and Gynecology

## 2018-07-08 ENCOUNTER — Encounter: Payer: Self-pay | Admitting: Obstetrics and Gynecology

## 2018-07-08 VITALS — BP 116/80 | HR 101 | Wt 220.0 lb

## 2018-07-08 DIAGNOSIS — N871 Moderate cervical dysplasia: Secondary | ICD-10-CM

## 2018-07-08 NOTE — Progress Notes (Signed)
Patient is coming in today for for her Colposcopy results from 06/24/18.

## 2018-07-08 NOTE — Progress Notes (Signed)
HPI:      [name] is a 47 y.o. [phone] who LMP was No LMP recorded. (Menstrual status: IUD).  Subjective:   She presents today to discuss her colposcopy results.  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 She continues to smoke cigarettes.  She states that her bleeding is minimal with her IUD but she says she has begun to have pelvic cramping monthly and it is sometimes bad enough to make her miss work.    Hx: The following portions of the patient's history were reviewed and updated as appropriate:             She  has a past medical history of Anxiety, Aortic regurgitation, Back pain, CHF (congestive heart failure) ([place]), COPD (chronic obstructive pulmonary disease) ([place]), Depression, Dyspnea, Gall stones, GERD (gastroesophageal reflux disease), Headache, Hypertension, Hypothyroidism, Kidney atrophy, Oxygen deficit, Pneumonia, Thyroid disease, and Vertigo. She does not have any pertinent problems on file. She  has a past surgical history that includes Cholecystectomy; No past surgeries; and Cholecystectomy (N/A, 02/28/2018). Her family history includes Cancer in her father and paternal grandmother; Diabetes in an other family member; Heart failure in an other family member; Hypertension in her maternal grandfather; Stroke in her maternal grandfather; Thyroid disease in her mother. She  reports that she has been smoking cigarettes. She has been smoking about 1.00 pack per day. She has never used smokeless tobacco. She reports that she does not drink alcohol or use drugs. She has a current medication list which includes the following prescription(s): albuterol, budesonide-formoterol, bupropion, cetirizine, clonazepam, ezetimibe, famotidine, ferrous sulfate, furosemide, levothyroxine, meclizine, potassium chloride sa, simvastatin, sucralfate, and ergocalciferol. She is allergic to lactose intolerance (gi).       Review of Systems:  Review of  Systems  Constitutional: Denied constitutional symptoms, night sweats, recent illness, fatigue, fever, insomnia and weight loss.  Eyes: Denied eye symptoms, eye pain, photophobia, vision change and visual disturbance.  Ears/Nose/Throat/Neck: Denied ear, nose, throat or neck symptoms, hearing loss, nasal discharge, sinus congestion and sore throat.  Cardiovascular: Denied cardiovascular symptoms, arrhythmia, chest pain/pressure, edema, exercise intolerance, orthopnea and palpitations.  Respiratory: Denied pulmonary symptoms, asthma, pleuritic pain, productive sputum, cough, dyspnea and wheezing.  Gastrointestinal: Denied, gastro-esophageal reflux, melena, nausea and vomiting.  Genitourinary: See HPI for additional information.  Musculoskeletal: Denied musculoskeletal symptoms, stiffness, swelling, muscle weakness and myalgia.  Dermatologic: Denied dermatology symptoms, rash and scar.  Neurologic: Denied neurology symptoms, dizziness, headache, neck pain and syncope.  Psychiatric: Denied psychiatric symptoms, anxiety and depression.  Endocrine: Denied endocrine symptoms including hot flashes and night sweats.   Meds:   Current Outpatient Medications on File Prior to Visit  Medication Sig Dispense Refill  . albuterol (PROVENTIL HFA;VENTOLIN HFA) 108 (90 BASE) MCG/ACT inhaler Inhale 2 puffs into the lungs every 4 (four) hours as needed for wheezing or shortness of breath.    . budesonide-formoterol (SYMBICORT) 160-4.5 MCG/ACT inhaler Inhale 2 puffs into the lungs 2 (two) times daily.    Marland Kitchen buPROPion (WELLBUTRIN XL) 150 MG 24 hr tablet Take 300 mg by mouth daily.   2  . cetirizine (ZYRTEC) 10 MG tablet Take 10 mg by mouth daily.    . clonazePAM (KLONOPIN) 0.5 MG tablet Take 0.5 mg 2 (two) times daily as needed by mouth for anxiety.    Marland Kitchen ezetimibe (ZETIA) 10 MG tablet Take 10 mg by mouth daily.   2  . famotidine (PEPCID) 20 MG tablet Take 20 mg by mouth 2 (two)  times daily.    . ferrous sulfate  325 (65 FE) MG tablet Take 325 mg by mouth daily with breakfast.    . furosemide (LASIX) 20 MG tablet Take 40 mg by mouth daily.     Marland Kitchen levothyroxine (SYNTHROID, LEVOTHROID) 300 MCG tablet Take 300 mcg by mouth daily before breakfast.    . meclizine (ANTIVERT) 25 MG tablet Take 25 mg by mouth 2 (two) times daily as needed for dizziness.   2  . potassium chloride SA (K-DUR,KLOR-CON) 20 MEQ tablet Take 20 mEq by mouth daily.     . simvastatin (ZOCOR) 40 MG tablet Take 40 mg by mouth daily.     . sucralfate (CARAFATE) 1 g tablet Take 1 tablet (1 g total) by mouth 4 (four) times daily. 60 tablet 0  . VITAMIN D, ERGOCALCIFEROL, PO Take 5,000 Units by mouth daily.      No current facility-administered medications on file prior to visit.     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 S/P laparoscopic cholecystectomy 02/28/2018  . Gallstones 02/28/2018  . Malnutrition of moderate degree 06/30/2015  . RUQ abdominal pain   . Acute cholecystitis 06/28/2015  . Allergic reaction 05/26/2015     1. CIN II (cervical intraepithelial neoplasia II)        Plan:            1.  Multiple options discussed with the patient.  These include continued monitoring with colposcopy every 6 months as long as CIN-2 is the diagnosis.  Versus LEEP for CIN-2 with typical LEEP follow-up [place].  Versus consideration of hysterectomy for CIN-2 and chronic pelvic pain despite IUD. Patient has decided to have another colposcopy in 6 months.  During the 6 months she will monitor her pain and how often it is a factor in her life.  She will make a final decision after her next colposcopy regarding future management.  Orders No orders of the defined types were placed in this encounter.   No orders of the defined types were placed in this encounter.      F/U  Return in about 6 months (around 01/06/2019). I spent 18 minutes involved in the care of this patient of which greater than 50% was spent discussing colposcopy findings, CIN-2, possibility of LEEP versus repeat colposcopy versus hysterectomy discussed in detail, pelvic pain discussed risk benefits of the above options discussed in detail.  All questions answered.  Finis Bud, M.D. 07/08/2018 11:34 AM

## 2018-08-20 ENCOUNTER — Other Ambulatory Visit: Payer: Self-pay | Admitting: Family Medicine

## 2018-08-20 DIAGNOSIS — N6310 Unspecified lump in the right breast, unspecified quadrant: Secondary | ICD-10-CM

## 2018-09-29 ENCOUNTER — Inpatient Hospital Stay: Admission: RE | Admit: 2018-09-29 | Payer: Medicaid Other | Source: Ambulatory Visit

## 2018-09-29 ENCOUNTER — Other Ambulatory Visit: Payer: Medicaid Other

## 2018-10-13 ENCOUNTER — Other Ambulatory Visit: Payer: Medicaid Other

## 2018-10-13 ENCOUNTER — Ambulatory Visit: Payer: Self-pay | Admitting: Obstetrics and Gynecology

## 2018-10-13 ENCOUNTER — Inpatient Hospital Stay: Admission: RE | Admit: 2018-10-13 | Payer: Medicaid Other | Source: Ambulatory Visit

## 2018-12-25 ENCOUNTER — Ambulatory Visit
Admission: RE | Admit: 2018-12-25 | Discharge: 2018-12-25 | Disposition: A | Discharge status: Home / Self Care | Payer: Medicaid Other | Source: Ambulatory Visit | Attending: Family Medicine | Admitting: Family Medicine

## 2018-12-25 ENCOUNTER — Other Ambulatory Visit: Payer: Self-pay

## 2018-12-25 DIAGNOSIS — N6310 Unspecified lump in the right breast, unspecified quadrant: Secondary | ICD-10-CM | POA: Diagnosis present

## 2018-12-25 DIAGNOSIS — Z1239 Encounter for other screening for malignant neoplasm of breast: Secondary | ICD-10-CM | POA: Insufficient documentation

## 2019-01-05 ENCOUNTER — Telehealth: Payer: Self-pay

## 2019-01-05 NOTE — Telephone Encounter (Signed)
Coronavirus (COVID-19) Are you at risk?  Are you at risk for the Coronavirus (COVID-19)?  To be considered HIGH RISK for Coronavirus (COVID-19), you have to meet the following criteria:  . Traveled to [place], [place], [place], [place] or [place]; or in the [place] to [place], [place], [place], or [place]; and have fever, cough, and shortness of breath within the last 2 weeks of travel OR . Been in close contact with a person diagnosed with COVID-19 within the last 2 weeks and have fever, cough, and shortness of breath . IF YOU DO NOT MEET THESE CRITERIA, YOU ARE CONSIDERED LOW RISK FOR COVID-19.  What to do if you are HIGH RISK for COVID-19?  Marland Kitchen If you are having a medical emergency, call 911. . Seek medical care right away. Before you go to a doctor's office, urgent care or emergency department, call ahead and tell them about your recent travel, contact with someone diagnosed with COVID-19, and your symptoms. You should receive instructions from your physician's office regarding next steps of care.  . When you arrive at healthcare provider, tell the healthcare staff immediately you have returned from visiting [place], [place], [place], [place] or [place]; or traveled in the [place] to [place], [place], [place], or [place]; in the last two weeks or you have been in close contact with a person diagnosed with COVID-19 in the last 2 weeks.   . Tell the health care staff about your symptoms: fever, cough and shortness of breath. . After you have been seen by a medical provider, you will be either: o Tested for (COVID-19) and discharged home on quarantine except to seek medical care if symptoms worsen, and asked to  - Stay home and avoid contact with others until you get your results (4-5 days)  - Avoid travel on public transportation if possible (such as bus, train, or airplane) or o Sent to the Emergency Department by EMS for evaluation, COVID-19 testing, and possible  admission depending on your condition and test results.  What to do if you are LOW RISK for COVID-19?  Reduce your risk of any infection by using the same precautions used for avoiding the common cold or flu:  Marland Kitchen Wash your hands often with soap and warm water for at least 20 seconds.  If soap and water are not readily available, use an alcohol-based hand sanitizer with at least 60% alcohol.  . If coughing or sneezing, cover your mouth and nose by coughing or sneezing into the elbow areas of your shirt or coat, into a tissue or into your sleeve (not your hands). . Avoid shaking hands with others and consider head nods or verbal greetings only. . Avoid touching your eyes, nose, or mouth with unwashed hands.  . Avoid close contact with people who are Grace Fox. . Avoid places or events with large numbers of people in one location, like concerts or sporting events. . Carefully consider travel plans you have or are making. . If you are planning any travel outside or inside the [place], visit the CDC's Travelers' Health webpage for the latest health notices. . If you have some symptoms but not all symptoms, continue to monitor at home and seek medical attention if your symptoms worsen. . If you are having a medical emergency, call 911.  01/05/19 SCREENING NEG SLS ADDITIONAL HEALTHCARE OPTIONS FOR PATIENTS  [facility] / e-Visit: [email or web address]         [facility]: [phone]  [facility]: [phone]                   [facility]: [phone]  

## 2019-01-06 ENCOUNTER — Other Ambulatory Visit: Payer: Self-pay

## 2019-01-06 ENCOUNTER — Other Ambulatory Visit (HOSPITAL_COMMUNITY)
Admission: RE | Admit: 2019-01-06 | Discharge: 2019-01-06 | Disposition: A | Discharge status: Home / Self Care | Payer: Medicaid Other | Source: Ambulatory Visit | Attending: Obstetrics and Gynecology | Admitting: Obstetrics and Gynecology

## 2019-01-06 ENCOUNTER — Ambulatory Visit (INDEPENDENT_AMBULATORY_CARE_PROVIDER_SITE_OTHER): Payer: Medicaid Other | Admitting: Obstetrics and Gynecology

## 2019-01-06 ENCOUNTER — Encounter: Payer: Self-pay | Admitting: Obstetrics and Gynecology

## 2019-01-06 VITALS — BP 120/68 | HR 89 | Wt 246.0 lb

## 2019-01-06 DIAGNOSIS — N871 Moderate cervical dysplasia: Secondary | ICD-10-CM

## 2019-01-06 NOTE — Progress Notes (Signed)
HPI:  Grace Fox is a 48 y.o.  [phone]  who presents today for evaluation and management of abnormal cervical cytology.    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  6 months ago  ROS: Patient also complains of crampy pelvic pain despite IUD.  She is however no longer having menses.   OB History  Gravida Para Term Preterm AB Living  6 6 5 1   6   SAB TAB Ectopic Multiple Live Births          6    # Outcome Date GA Lbr Len/2nd Weight Sex Delivery Anes PTL Lv  6 Preterm 2009   5 lb 13 oz (2.637 kg) F Vag-Spont  Y LIV  5 Term 2005   8 lb 3 oz (3.714 kg) F Vag-Spont  N LIV  4 Term 1998   8 lb 15 oz (4.054 kg) F Vag-Spont  Y LIV  3 Term 1997   8 lb 13 oz (3.997 kg) F Vag-Spont  Y LIV  2 Term 1995   8 lb 3 oz (3.714 kg) M Vag-Spont  Y LIV  1 Term 1993   7 lb 1.5 oz (3.218 kg) F Vag-Spont  Y LIV     Complications: Pre-eclampsia    Past Medical History:  Diagnosis Date  . Anxiety   . Aortic regurgitation   . Back pain    SCIATICA  . CHF (congestive heart failure) ([place])   . COPD (chronic obstructive pulmonary disease) ([place])   . Depression   . Dyspnea    DOE  . Gall stones   . GERD (gastroesophageal reflux disease)   . Headache   . Hypertension   . Hypothyroidism   . Kidney atrophy   . Oxygen deficit    2L/ HS  . Pneumonia   . Thyroid disease   . Vertigo     Past Surgical History:  Procedure Laterality Date  . CHOLECYSTECTOMY    . CHOLECYSTECTOMY N/A 02/28/2018   Procedure: LAPAROSCOPIC CHOLECYSTECTOMY WITH INTRAOPERATIVE CHOLANGIOGRAM;  Surgeon: Jovita Kussmaul, MD;  Location: [facility] ORS;  Service: General;  Laterality: N/A;  . NO PAST SURGERIES      SOCIAL HISTORY: Social History   Substance and Sexual Activity  Alcohol Use No   Social History   Substance and Sexual Activity  Drug Use No     Family History  Problem Relation Age of Onset  . Diabetes Other   . Heart failure Other   . Thyroid disease Mother   . Cancer Father    . Hypertension Maternal Grandfather   . Stroke Maternal Grandfather   . Cancer Paternal Grandmother   . Breast cancer Neg Hx     ALLERGIES:  Lactose intolerance (gi)  She has a current medication list which includes the following prescription(s): albuterol, budesonide-formoterol, bupropion, cetirizine, clonazepam, ezetimibe, famotidine, ferrous sulfate, furosemide, levothyroxine, meclizine, potassium chloride sa, simvastatin, sucralfate, and ergocalciferol.  Physical Exam: -Vitals:  BP 120/68   Pulse 89   Wt 246 lb (111.6 kg)   BMI 40.94 kg/m    PROCEDURE: 1.  Urine Pregnancy Test: IUD in place  (string noted at external os) 2.  Colposcopy performed with 4% acetic acid after verbal consent obtained                           -Aceto-white Lesions Location(s): 10-12 5-8 o'clock.              -  Biopsy performed at 6 and 11 o'clock               -ECC indicated and performed: No.     -Biopsy sites made hemostatic with pressure and Monsel's solution   -Satisfactory colposcopy: Yes.      -Evidence of Invasive cervical CA :  NO  ASSESSMENT:  Grace Fox is a 48 y.o. [phone] here for  1. CIN II (cervical intraepithelial neoplasia II)   .  PLAN: 1.  I discussed the grading system of pap smears and HPV high risk viral types.  We will discuss management after colpo results return.  No orders of the defined types were placed in this encounter.          F/U  Return in about 2 weeks (around 01/20/2019) for Colpo f/u.  Jeannie Fend ,MD 01/06/2019,12:16 PM

## 2019-01-06 NOTE — Addendum Note (Signed)
Addended by: Durwin Glaze on: 01/06/2019 01:42 PM   Modules accepted: Orders

## 2019-01-20 ENCOUNTER — Ambulatory Visit (INDEPENDENT_AMBULATORY_CARE_PROVIDER_SITE_OTHER): Payer: Medicaid Other | Admitting: Obstetrics and Gynecology

## 2019-01-20 ENCOUNTER — Encounter: Payer: Self-pay | Admitting: Obstetrics and Gynecology

## 2019-01-20 ENCOUNTER — Other Ambulatory Visit: Payer: Self-pay

## 2019-01-20 VITALS — BP 112/72 | HR 84 | Wt 241.7 lb

## 2019-01-20 DIAGNOSIS — N871 Moderate cervical dysplasia: Secondary | ICD-10-CM | POA: Diagnosis not present

## 2019-01-20 NOTE — Progress Notes (Signed)
HPI:      [name] is a 48 y.o. [phone] who LMP was No LMP recorded. (Menstrual status: IUD).  Subjective:   She presents today for follow-up of colposcopy.  She had a history of CIN-2. Patient has not discontinued tobacco use.    Hx: The following portions of the patient's history were reviewed and updated as appropriate:             She  has a past medical history of Anxiety, Aortic regurgitation, Back pain, CHF (congestive heart failure) ([place]), COPD (chronic obstructive pulmonary disease) ([place]), Depression, Dyspnea, Gall stones, GERD (gastroesophageal reflux disease), Headache, Hypertension, Hypothyroidism, Kidney atrophy, Oxygen deficit, Pneumonia, Thyroid disease, and Vertigo. She does not have any pertinent problems on file. She  has a past surgical history that includes Cholecystectomy; No past surgeries; and Cholecystectomy (N/A, 02/28/2018). Her family history includes Cancer in her father and paternal grandmother; Diabetes in an other family member; Heart failure in an other family member; Hypertension in her maternal grandfather; Stroke in her maternal grandfather; Thyroid disease in her mother. She  reports that she has been smoking cigarettes. She has been smoking about 1.00 pack per day. She has never used smokeless tobacco. She reports that she does not drink alcohol or use drugs. She has a current medication list which includes the following prescription(s): albuterol, budesonide-formoterol, bupropion, cetirizine, clonazepam, ezetimibe, famotidine, ferrous sulfate, furosemide, levothyroxine, meclizine, potassium chloride sa, simvastatin, sucralfate, and ergocalciferol. She is allergic to lactose intolerance (gi).       Review of Systems:  Review of Systems  Constitutional: Denied constitutional symptoms, night sweats, recent illness, fatigue, fever, insomnia and weight loss.  Eyes: Denied eye symptoms, eye pain, photophobia, vision change and visual disturbance.   Ears/Nose/Throat/Neck: Denied ear, nose, throat or neck symptoms, hearing loss, nasal discharge, sinus congestion and sore throat.  Cardiovascular: Denied cardiovascular symptoms, arrhythmia, chest pain/pressure, edema, exercise intolerance, orthopnea and palpitations.  Respiratory: Denied pulmonary symptoms, asthma, pleuritic pain, productive sputum, cough, dyspnea and wheezing.  Gastrointestinal: Denied, gastro-esophageal reflux, melena, nausea and vomiting.  Genitourinary: Denied genitourinary symptoms including symptomatic vaginal discharge, pelvic relaxation issues, and urinary complaints.  Musculoskeletal: Denied musculoskeletal symptoms, stiffness, swelling, muscle weakness and myalgia.  Dermatologic: Denied dermatology symptoms, rash and scar.  Neurologic: Denied neurology symptoms, dizziness, headache, neck pain and syncope.  Psychiatric: Denied psychiatric symptoms, anxiety and depression.  Endocrine: Denied endocrine symptoms including hot flashes and night sweats.   Meds:   Current Outpatient Medications on File Prior to Visit  Medication Sig Dispense Refill  . albuterol (PROVENTIL HFA;VENTOLIN HFA) 108 (90 BASE) MCG/ACT inhaler Inhale 2 puffs into the lungs every 4 (four) hours as needed for wheezing or shortness of breath.    . budesonide-formoterol (SYMBICORT) 160-4.5 MCG/ACT inhaler Inhale 2 puffs into the lungs 2 (two) times daily.    Marland Kitchen buPROPion (WELLBUTRIN XL) 150 MG 24 hr tablet Take 300 mg by mouth daily.   2  . cetirizine (ZYRTEC) 10 MG tablet Take 10 mg by mouth daily.    . clonazePAM (KLONOPIN) 0.5 MG tablet Take 0.5 mg 2 (two) times daily as needed by mouth for anxiety.    Marland Kitchen ezetimibe (ZETIA) 10 MG tablet Take 10 mg by mouth daily.   2  . famotidine (PEPCID) 20 MG tablet Take 20 mg by mouth 2 (two) times daily.    . ferrous sulfate 325 (65 FE) MG tablet Take 325 mg by mouth daily with breakfast.    . furosemide (LASIX)  20 MG tablet Take 40 mg by mouth daily.     Marland Kitchen  levothyroxine (SYNTHROID, LEVOTHROID) 300 MCG tablet Take 300 mcg by mouth daily before breakfast.    . meclizine (ANTIVERT) 25 MG tablet Take 25 mg by mouth 2 (two) times daily as needed for dizziness.   2  . potassium chloride SA (K-DUR,KLOR-CON) 20 MEQ tablet Take 20 mEq by mouth daily.     . simvastatin (ZOCOR) 40 MG tablet Take 40 mg by mouth daily.     . sucralfate (CARAFATE) 1 g tablet Take 1 tablet (1 g total) by mouth 4 (four) times daily. 60 tablet 0  . VITAMIN D, ERGOCALCIFEROL, PO Take 5,000 Units by mouth daily.      No current facility-administered medications on file prior to visit.     Objective:     Vitals:   01/20/19 1058  BP: 112/72  Pulse: 84              Biopsy results reviewed in detail with the patient.  Assessment:    [id number] Patient Active Problem List   Diagnosis Date Noted  . S/P laparoscopic cholecystectomy 02/28/2018  . Gallstones 02/28/2018  . Malnutrition of moderate degree 06/30/2015  . RUQ abdominal pain   . Acute cholecystitis 06/28/2015  . Allergic reaction 05/26/2015     1. CIN II (cervical intraepithelial neoplasia II)     Persistent CIN-2.   Plan:            1.  We have discussed the options for CIN-2 including expectant management and follow-up colposcopy versus LEEP.  Patient says that she is tired of following year via colposcopy I would like to try to take a chance to have it "fixed". Patient will follow-up for preop LEEP in the OR because of her IUD and large cervical volume. Orders No orders of the defined types were placed in this encounter.   No orders of the defined types were placed in this encounter.     F/U  Return in about 1 week (around 01/27/2019). I spent 16 minutes involved in the care of this patient of which greater than 50% was spent discussing natural course and history of CIN, risk of CIN-2 versus normal, follow-up methods for CIN-2 including colposcopy and LEEP.  All questions answered.  Finis Bud,  M.D. 01/20/2019 11:18 AM

## 2019-01-20 NOTE — Progress Notes (Signed)
Patient comes in today for colposcopy results.

## 2019-01-27 ENCOUNTER — Ambulatory Visit (INDEPENDENT_AMBULATORY_CARE_PROVIDER_SITE_OTHER): Payer: Medicaid Other | Admitting: Obstetrics and Gynecology

## 2019-01-27 ENCOUNTER — Other Ambulatory Visit: Payer: Self-pay

## 2019-01-27 ENCOUNTER — Encounter: Payer: Self-pay | Admitting: Obstetrics and Gynecology

## 2019-01-27 VITALS — BP 120/66 | HR 82 | Ht 65.0 in | Wt 244.8 lb

## 2019-01-27 DIAGNOSIS — N871 Moderate cervical dysplasia: Secondary | ICD-10-CM | POA: Diagnosis not present

## 2019-01-27 NOTE — Progress Notes (Signed)
PRE-OPERATIVE HISTORY AND PHYSICAL EXAM  PCP:  Remi Haggard, FNP Subjective:   HPI:  Grace Fox is a 48 y.o. [phone].  No LMP recorded. (Menstrual status: IUD).  She presents today for a pre-op discussion and PE.  She has the following symptoms:  CIN II (persistent)   Review of Systems:   Constitutional: Denied constitutional symptoms, night sweats, recent illness, fatigue, fever, insomnia and weight loss.  Eyes: Denied eye symptoms, eye pain, photophobia, vision change and visual disturbance.  Ears/Nose/Throat/Neck: Denied ear, nose, throat or neck symptoms, hearing loss, nasal discharge, sinus congestion and sore throat.  Cardiovascular: Denied cardiovascular symptoms, arrhythmia, chest pain/pressure, edema, exercise intolerance, orthopnea and palpitations.  Respiratory: Denied pulmonary symptoms, asthma, pleuritic pain, productive sputum, cough, dyspnea and wheezing.  Gastrointestinal: Denied, gastro-esophageal reflux, melena, nausea and vomiting.  Genitourinary: Denied genitourinary symptoms including symptomatic vaginal discharge, pelvic relaxation issues, and urinary complaints.  Musculoskeletal: Denied musculoskeletal symptoms, stiffness, swelling, muscle weakness and myalgia.  Dermatologic: Denied dermatology symptoms, rash and scar.  Neurologic: Denied neurology symptoms, dizziness, headache, neck pain and syncope.  Psychiatric: Denied psychiatric symptoms, anxiety and depression.  Endocrine: Denied endocrine symptoms including hot flashes and night sweats.   OB History  Gravida Para Term Preterm AB Living  6 6 5 1   6   SAB TAB Ectopic Multiple Live Births          6    # Outcome Date GA Lbr Len/2nd Weight Sex Delivery Anes PTL Lv  6 Preterm 2009   5 lb 13 oz (2.637 kg) F Vag-Spont  Y LIV  5 Term 2005   8 lb 3 oz (3.714 kg) F Vag-Spont  N LIV  4 Term 1998   8 lb 15 oz (4.054 kg) F Vag-Spont  Y LIV  3 Term 1997   8 lb 13 oz (3.997 kg) F Vag-Spont  Y LIV   2 Term 1995   8 lb 3 oz (3.714 kg) M Vag-Spont  Y LIV  1 Term 1993   7 lb 1.5 oz (3.218 kg) F Vag-Spont  Y LIV     Complications: Pre-eclampsia    Past Medical History:  Diagnosis Date  . Anxiety   . Aortic regurgitation   . Back pain    SCIATICA  . CHF (congestive heart failure) ([place])   . COPD (chronic obstructive pulmonary disease) ([place])   . Depression   . Dyspnea    DOE  . Gall stones   . GERD (gastroesophageal reflux disease)   . Headache   . Hypertension   . Hypothyroidism   . Kidney atrophy   . Oxygen deficit    2L/ HS  . Pneumonia   . Thyroid disease   . Vertigo     Past Surgical History:  Procedure Laterality Date  . CHOLECYSTECTOMY    . CHOLECYSTECTOMY N/A 02/28/2018   Procedure: LAPAROSCOPIC CHOLECYSTECTOMY WITH INTRAOPERATIVE CHOLANGIOGRAM;  Surgeon: Jovita Kussmaul, MD;  Location: [facility] ORS;  Service: General;  Laterality: N/A;  . NO PAST SURGERIES        SOCIAL HISTORY: Social History   Tobacco Use  Smoking Status Current Every Day Smoker  . Packs/day: 1.00  . Types: Cigarettes  Smokeless Tobacco Never Used   Social History   Substance and Sexual Activity  Alcohol Use No   Social History   Substance and Sexual Activity  Drug Use No    Family History  Problem Relation Age of Onset  .  Diabetes Other   . Heart failure Other   . Thyroid disease Mother   . Cancer Father   . Hypertension Maternal Grandfather   . Stroke Maternal Grandfather   . Cancer Paternal Grandmother   . Breast cancer Neg Hx     ALLERGIES:  Lactose intolerance (gi)  MEDS:   Current Outpatient Medications on File Prior to Visit  Medication Sig Dispense Refill  . albuterol (PROVENTIL HFA;VENTOLIN HFA) 108 (90 BASE) MCG/ACT inhaler Inhale 2 puffs into the lungs every 4 (four) hours as needed for wheezing or shortness of breath.    . budesonide-formoterol (SYMBICORT) 160-4.5 MCG/ACT inhaler Inhale 2 puffs into the lungs 2 (two) times daily.    Marland Kitchen buPROPion (WELLBUTRIN  XL) 150 MG 24 hr tablet Take 300 mg by mouth daily.   2  . cetirizine (ZYRTEC) 10 MG tablet Take 10 mg by mouth daily.    . clonazePAM (KLONOPIN) 0.5 MG tablet Take 0.5 mg 2 (two) times daily as needed by mouth for anxiety.    Marland Kitchen ezetimibe (ZETIA) 10 MG tablet Take 10 mg by mouth daily.   2  . famotidine (PEPCID) 20 MG tablet Take 20 mg by mouth 2 (two) times daily.    . ferrous sulfate 325 (65 FE) MG tablet Take 325 mg by mouth daily with breakfast.    . furosemide (LASIX) 20 MG tablet Take 40 mg by mouth daily.     Marland Kitchen levothyroxine (SYNTHROID, LEVOTHROID) 300 MCG tablet Take 300 mcg by mouth daily before breakfast.    . meclizine (ANTIVERT) 25 MG tablet Take 25 mg by mouth 2 (two) times daily as needed for dizziness.   2  . potassium chloride SA (K-DUR,KLOR-CON) 20 MEQ tablet Take 20 mEq by mouth daily.     . simvastatin (ZOCOR) 40 MG tablet Take 40 mg by mouth daily.     . sucralfate (CARAFATE) 1 g tablet Take 1 tablet (1 g total) by mouth 4 (four) times daily. 60 tablet 0  . VITAMIN D, ERGOCALCIFEROL, PO Take 5,000 Units by mouth daily.      No current facility-administered medications on file prior to visit.     No orders of the defined types were placed in this encounter.    Physical examination There were no vitals taken for this visit.  General NAD, Conversant  HEENT Atraumatic; Op clear with mmm.  Normo-cephalic. Pupils reactive. Anicteric sclerae  Thyroid/Neck Smooth without nodularity or enlargement. Normal ROM.  Neck Supple.  Skin No rashes, lesions or ulceration. Normal palpated skin turgor. No nodularity.  Breasts: No masses or discharge.  Symmetric.  No axillary adenopathy.  Lungs: Clear to auscultation.No rales or wheezes. Normal Respiratory effort, no retractions.  Heart: NSR.  No murmurs or rubs appreciated. No periferal edema  Abdomen: Soft.  Non-tender.  No masses.  No HSM. No hernia  Extremities: Moves all appropriately.  Normal ROM for age. No lymphadenopathy.   Neuro: Oriented to PPT.  Normal mood. Normal affect.     Pelvic:   Deferred to OR Colpo = CIN II      Assessment:   [id number] Patient Active Problem List   Diagnosis Date Noted  . S/P laparoscopic cholecystectomy 02/28/2018  . Gallstones 02/28/2018  . Malnutrition of moderate degree 06/30/2015  . RUQ abdominal pain   . Acute cholecystitis 06/28/2015  . Allergic reaction 05/26/2015    1. CIN II (cervical intraepithelial neoplasia II)     Plan:   Orders: No orders of the defined  types were placed in this encounter.    1.  LEEP  Pre-op discussions regarding Risks and Benefits of her scheduled surgery.  LEEP The LEEP has been explained to the patient in detail; risks/benefits reviewed.  The risks include, but are not limited to, bleeding, infection, and the possibility of cervical stenosis or cervical incompetence.  The patient had previously been given information regarding abnormal PAP smears and their relationship to HPV.  We have discussed the natural course and history of HPV, the possibility of incomplete treatment by the LEEP, as well as the possibility of recurrence.  I have reviewed the consent form for LEEP with her, and she fully understands its contents.  We have discussed the procedure itself. I have informed her that following the LEEP she should refrain from intercourse and the use of tampons for three weeks, and that she should also expect some spotting and brown/black discharge over the next several days.  We have discussed the fact that vaginal bleeding, differentiated from spotting, is not normal and that if she should have this complication, she should contact me immediately.  The follow-up after LEEP will be PAP smears or viral typing performed at regular intervals for up to 3 years.  Should these all prove to be normal, she will then be back on typical cervical screening.  I have answered all of her questions, and I believe she has an adequate understanding of the  LEEP, its implications, and the necessity of follow-up care.  I spent 31 minutes involved in the care of this patient of which greater than 50% was spent discussing surgery, LEEP, follow-up after LEEP, CIN-2, HPV.  All questions answered.  Finis Bud, M.D. 01/27/2019 10:33 AM

## 2019-01-27 NOTE — Progress Notes (Signed)
Patient comes in today for pre op appointment.

## 2019-01-27 NOTE — H&P (View-Only) (Signed)
PRE-OPERATIVE HISTORY AND PHYSICAL EXAM  PCP:  Remi Haggard, FNP Subjective:   HPI:  Grace Fox is a 48 y.o. [phone].  No LMP recorded. (Menstrual status: IUD).  She presents today for a pre-op discussion and PE.  She has the following symptoms:  CIN II (persistent)   Review of Systems:   Constitutional: Denied constitutional symptoms, night sweats, recent illness, fatigue, fever, insomnia and weight loss.  Eyes: Denied eye symptoms, eye pain, photophobia, vision change and visual disturbance.  Ears/Nose/Throat/Neck: Denied ear, nose, throat or neck symptoms, hearing loss, nasal discharge, sinus congestion and sore throat.  Cardiovascular: Denied cardiovascular symptoms, arrhythmia, chest pain/pressure, edema, exercise intolerance, orthopnea and palpitations.  Respiratory: Denied pulmonary symptoms, asthma, pleuritic pain, productive sputum, cough, dyspnea and wheezing.  Gastrointestinal: Denied, gastro-esophageal reflux, melena, nausea and vomiting.  Genitourinary: Denied genitourinary symptoms including symptomatic vaginal discharge, pelvic relaxation issues, and urinary complaints.  Musculoskeletal: Denied musculoskeletal symptoms, stiffness, swelling, muscle weakness and myalgia.  Dermatologic: Denied dermatology symptoms, rash and scar.  Neurologic: Denied neurology symptoms, dizziness, headache, neck pain and syncope.  Psychiatric: Denied psychiatric symptoms, anxiety and depression.  Endocrine: Denied endocrine symptoms including hot flashes and night sweats.   OB History  Gravida Para Term Preterm AB Living  6 6 5 1   6   SAB TAB Ectopic Multiple Live Births          6    # Outcome Date GA Lbr Len/2nd Weight Sex Delivery Anes PTL Lv  6 Preterm 2009   5 lb 13 oz (2.637 kg) F Vag-Spont  Y LIV  5 Term 2005   8 lb 3 oz (3.714 kg) F Vag-Spont  N LIV  4 Term 1998   8 lb 15 oz (4.054 kg) F Vag-Spont  Y LIV  3 Term 1997   8 lb 13 oz (3.997 kg) F Vag-Spont  Y LIV   2 Term 1995   8 lb 3 oz (3.714 kg) M Vag-Spont  Y LIV  1 Term 1993   7 lb 1.5 oz (3.218 kg) F Vag-Spont  Y LIV     Complications: Pre-eclampsia    Past Medical History:  Diagnosis Date  . Anxiety   . Aortic regurgitation   . Back pain    SCIATICA  . CHF (congestive heart failure) ([place])   . COPD (chronic obstructive pulmonary disease) ([place])   . Depression   . Dyspnea    DOE  . Gall stones   . GERD (gastroesophageal reflux disease)   . Headache   . Hypertension   . Hypothyroidism   . Kidney atrophy   . Oxygen deficit    2L/ HS  . Pneumonia   . Thyroid disease   . Vertigo     Past Surgical History:  Procedure Laterality Date  . CHOLECYSTECTOMY    . CHOLECYSTECTOMY N/A 02/28/2018   Procedure: LAPAROSCOPIC CHOLECYSTECTOMY WITH INTRAOPERATIVE CHOLANGIOGRAM;  Surgeon: Jovita Kussmaul, MD;  Location: [facility] ORS;  Service: General;  Laterality: N/A;  . NO PAST SURGERIES        SOCIAL HISTORY: Social History   Tobacco Use  Smoking Status Current Every Day Smoker  . Packs/day: 1.00  . Types: Cigarettes  Smokeless Tobacco Never Used   Social History   Substance and Sexual Activity  Alcohol Use No   Social History   Substance and Sexual Activity  Drug Use No    Family History  Problem Relation Age of Onset  .  Diabetes Other   . Heart failure Other   . Thyroid disease Mother   . Cancer Father   . Hypertension Maternal Grandfather   . Stroke Maternal Grandfather   . Cancer Paternal Grandmother   . Breast cancer Neg Hx     ALLERGIES:  Lactose intolerance (gi)  MEDS:   Current Outpatient Medications on File Prior to Visit  Medication Sig Dispense Refill  . albuterol (PROVENTIL HFA;VENTOLIN HFA) 108 (90 BASE) MCG/ACT inhaler Inhale 2 puffs into the lungs every 4 (four) hours as needed for wheezing or shortness of breath.    . budesonide-formoterol (SYMBICORT) 160-4.5 MCG/ACT inhaler Inhale 2 puffs into the lungs 2 (two) times daily.    Marland Kitchen buPROPion (WELLBUTRIN  XL) 150 MG 24 hr tablet Take 300 mg by mouth daily.   2  . cetirizine (ZYRTEC) 10 MG tablet Take 10 mg by mouth daily.    . clonazePAM (KLONOPIN) 0.5 MG tablet Take 0.5 mg 2 (two) times daily as needed by mouth for anxiety.    Marland Kitchen ezetimibe (ZETIA) 10 MG tablet Take 10 mg by mouth daily.   2  . famotidine (PEPCID) 20 MG tablet Take 20 mg by mouth 2 (two) times daily.    . ferrous sulfate 325 (65 FE) MG tablet Take 325 mg by mouth daily with breakfast.    . furosemide (LASIX) 20 MG tablet Take 40 mg by mouth daily.     Marland Kitchen levothyroxine (SYNTHROID, LEVOTHROID) 300 MCG tablet Take 300 mcg by mouth daily before breakfast.    . meclizine (ANTIVERT) 25 MG tablet Take 25 mg by mouth 2 (two) times daily as needed for dizziness.   2  . potassium chloride SA (K-DUR,KLOR-CON) 20 MEQ tablet Take 20 mEq by mouth daily.     . simvastatin (ZOCOR) 40 MG tablet Take 40 mg by mouth daily.     . sucralfate (CARAFATE) 1 g tablet Take 1 tablet (1 g total) by mouth 4 (four) times daily. 60 tablet 0  . VITAMIN D, ERGOCALCIFEROL, PO Take 5,000 Units by mouth daily.      No current facility-administered medications on file prior to visit.     No orders of the defined types were placed in this encounter.    Physical examination There were no vitals taken for this visit.  General NAD, Conversant  HEENT Atraumatic; Op clear with mmm.  Normo-cephalic. Pupils reactive. Anicteric sclerae  Thyroid/Neck Smooth without nodularity or enlargement. Normal ROM.  Neck Supple.  Skin No rashes, lesions or ulceration. Normal palpated skin turgor. No nodularity.  Breasts: No masses or discharge.  Symmetric.  No axillary adenopathy.  Lungs: Clear to auscultation.No rales or wheezes. Normal Respiratory effort, no retractions.  Heart: NSR.  No murmurs or rubs appreciated. No periferal edema  Abdomen: Soft.  Non-tender.  No masses.  No HSM. No hernia  Extremities: Moves all appropriately.  Normal ROM for age. No lymphadenopathy.   Neuro: Oriented to PPT.  Normal mood. Normal affect.     Pelvic:   Deferred to OR Colpo = CIN II      Assessment:   [id number] Patient Active Problem List   Diagnosis Date Noted  . S/P laparoscopic cholecystectomy 02/28/2018  . Gallstones 02/28/2018  . Malnutrition of moderate degree 06/30/2015  . RUQ abdominal pain   . Acute cholecystitis 06/28/2015  . Allergic reaction 05/26/2015    1. CIN II (cervical intraepithelial neoplasia II)     Plan:   Orders: No orders of the defined  types were placed in this encounter.    1.  LEEP  Pre-op discussions regarding Risks and Benefits of her scheduled surgery.  LEEP The LEEP has been explained to the patient in detail; risks/benefits reviewed.  The risks include, but are not limited to, bleeding, infection, and the possibility of cervical stenosis or cervical incompetence.  The patient had previously been given information regarding abnormal PAP smears and their relationship to HPV.  We have discussed the natural course and history of HPV, the possibility of incomplete treatment by the LEEP, as well as the possibility of recurrence.  I have reviewed the consent form for LEEP with her, and she fully understands its contents.  We have discussed the procedure itself. I have informed her that following the LEEP she should refrain from intercourse and the use of tampons for three weeks, and that she should also expect some spotting and brown/black discharge over the next several days.  We have discussed the fact that vaginal bleeding, differentiated from spotting, is not normal and that if she should have this complication, she should contact me immediately.  The follow-up after LEEP will be PAP smears or viral typing performed at regular intervals for up to 3 years.  Should these all prove to be normal, she will then be back on typical cervical screening.  I have answered all of her questions, and I believe she has an adequate understanding of the  LEEP, its implications, and the necessity of follow-up care.

## 2019-01-27 NOTE — H&P (Signed)
PRE-OPERATIVE HISTORY AND PHYSICAL EXAM  PCP:  Remi Haggard, FNP Subjective:   HPI:  Grace Fox is a 48 y.o. [phone].  No LMP recorded. (Menstrual status: IUD).  She presents today for a pre-op discussion and PE.  She has the following symptoms:  CIN II (persistent)   Review of Systems:   Constitutional: Denied constitutional symptoms, night sweats, recent illness, fatigue, fever, insomnia and weight loss.  Eyes: Denied eye symptoms, eye pain, photophobia, vision change and visual disturbance.  Ears/Nose/Throat/Neck: Denied ear, nose, throat or neck symptoms, hearing loss, nasal discharge, sinus congestion and sore throat.  Cardiovascular: Denied cardiovascular symptoms, arrhythmia, chest pain/pressure, edema, exercise intolerance, orthopnea and palpitations.  Respiratory: Denied pulmonary symptoms, asthma, pleuritic pain, productive sputum, cough, dyspnea and wheezing.  Gastrointestinal: Denied, gastro-esophageal reflux, melena, nausea and vomiting.  Genitourinary: Denied genitourinary symptoms including symptomatic vaginal discharge, pelvic relaxation issues, and urinary complaints.  Musculoskeletal: Denied musculoskeletal symptoms, stiffness, swelling, muscle weakness and myalgia.  Dermatologic: Denied dermatology symptoms, rash and scar.  Neurologic: Denied neurology symptoms, dizziness, headache, neck pain and syncope.  Psychiatric: Denied psychiatric symptoms, anxiety and depression.  Endocrine: Denied endocrine symptoms including hot flashes and night sweats.   OB History  Gravida Para Term Preterm AB Living  6 6 5 1   6   SAB TAB Ectopic Multiple Live Births          6    # Outcome Date GA Lbr Len/2nd Weight Sex Delivery Anes PTL Lv  6 Preterm 2009   5 lb 13 oz (2.637 kg) F Vag-Spont  Y LIV  5 Term 2005   8 lb 3 oz (3.714 kg) F Vag-Spont  N LIV  4 Term 1998   8 lb 15 oz (4.054 kg) F Vag-Spont  Y LIV  3 Term 1997   8 lb 13 oz (3.997 kg) F Vag-Spont  Y LIV   2 Term 1995   8 lb 3 oz (3.714 kg) M Vag-Spont  Y LIV  1 Term 1993   7 lb 1.5 oz (3.218 kg) F Vag-Spont  Y LIV     Complications: Pre-eclampsia    Past Medical History:  Diagnosis Date  . Anxiety   . Aortic regurgitation   . Back pain    SCIATICA  . CHF (congestive heart failure) ([place])   . COPD (chronic obstructive pulmonary disease) ([place])   . Depression   . Dyspnea    DOE  . Gall stones   . GERD (gastroesophageal reflux disease)   . Headache   . Hypertension   . Hypothyroidism   . Kidney atrophy   . Oxygen deficit    2L/ HS  . Pneumonia   . Thyroid disease   . Vertigo     Past Surgical History:  Procedure Laterality Date  . CHOLECYSTECTOMY    . CHOLECYSTECTOMY N/A 02/28/2018   Procedure: LAPAROSCOPIC CHOLECYSTECTOMY WITH INTRAOPERATIVE CHOLANGIOGRAM;  Surgeon: Jovita Kussmaul, MD;  Location: [facility] ORS;  Service: General;  Laterality: N/A;  . NO PAST SURGERIES        SOCIAL HISTORY: Social History   Tobacco Use  Smoking Status Current Every Day Smoker  . Packs/day: 1.00  . Types: Cigarettes  Smokeless Tobacco Never Used   Social History   Substance and Sexual Activity  Alcohol Use No   Social History   Substance and Sexual Activity  Drug Use No    Family History  Problem Relation Age of Onset  .  Diabetes Other   . Heart failure Other   . Thyroid disease Mother   . Cancer Father   . Hypertension Maternal Grandfather   . Stroke Maternal Grandfather   . Cancer Paternal Grandmother   . Breast cancer Neg Hx     ALLERGIES:  Lactose intolerance (gi)  MEDS:   Current Outpatient Medications on File Prior to Visit  Medication Sig Dispense Refill  . albuterol (PROVENTIL HFA;VENTOLIN HFA) 108 (90 BASE) MCG/ACT inhaler Inhale 2 puffs into the lungs every 4 (four) hours as needed for wheezing or shortness of breath.    . budesonide-formoterol (SYMBICORT) 160-4.5 MCG/ACT inhaler Inhale 2 puffs into the lungs 2 (two) times daily.    Marland Kitchen buPROPion (WELLBUTRIN  XL) 150 MG 24 hr tablet Take 300 mg by mouth daily.   2  . cetirizine (ZYRTEC) 10 MG tablet Take 10 mg by mouth daily.    . clonazePAM (KLONOPIN) 0.5 MG tablet Take 0.5 mg 2 (two) times daily as needed by mouth for anxiety.    Marland Kitchen ezetimibe (ZETIA) 10 MG tablet Take 10 mg by mouth daily.   2  . famotidine (PEPCID) 20 MG tablet Take 20 mg by mouth 2 (two) times daily.    . ferrous sulfate 325 (65 FE) MG tablet Take 325 mg by mouth daily with breakfast.    . furosemide (LASIX) 20 MG tablet Take 40 mg by mouth daily.     Marland Kitchen levothyroxine (SYNTHROID, LEVOTHROID) 300 MCG tablet Take 300 mcg by mouth daily before breakfast.    . meclizine (ANTIVERT) 25 MG tablet Take 25 mg by mouth 2 (two) times daily as needed for dizziness.   2  . potassium chloride SA (K-DUR,KLOR-CON) 20 MEQ tablet Take 20 mEq by mouth daily.     . simvastatin (ZOCOR) 40 MG tablet Take 40 mg by mouth daily.     . sucralfate (CARAFATE) 1 g tablet Take 1 tablet (1 g total) by mouth 4 (four) times daily. 60 tablet 0  . VITAMIN D, ERGOCALCIFEROL, PO Take 5,000 Units by mouth daily.      No current facility-administered medications on file prior to visit.     No orders of the defined types were placed in this encounter.    Physical examination There were no vitals taken for this visit.  General NAD, Conversant  HEENT Atraumatic; Op clear with mmm.  Normo-cephalic. Pupils reactive. Anicteric sclerae  Thyroid/Neck Smooth without nodularity or enlargement. Normal ROM.  Neck Supple.  Skin No rashes, lesions or ulceration. Normal palpated skin turgor. No nodularity.  Breasts: No masses or discharge.  Symmetric.  No axillary adenopathy.  Lungs: Clear to auscultation.No rales or wheezes. Normal Respiratory effort, no retractions.  Heart: NSR.  No murmurs or rubs appreciated. No periferal edema  Abdomen: Soft.  Non-tender.  No masses.  No HSM. No hernia  Extremities: Moves all appropriately.  Normal ROM for age. No lymphadenopathy.   Neuro: Oriented to PPT.  Normal mood. Normal affect.     Pelvic:   Deferred to OR Colpo = CIN II      Assessment:   [id number] Patient Active Problem List   Diagnosis Date Noted  . S/P laparoscopic cholecystectomy 02/28/2018  . Gallstones 02/28/2018  . Malnutrition of moderate degree 06/30/2015  . RUQ abdominal pain   . Acute cholecystitis 06/28/2015  . Allergic reaction 05/26/2015    1. CIN II (cervical intraepithelial neoplasia II)     Plan:   Orders: No orders of the defined  types were placed in this encounter.    1.  LEEP  Pre-op discussions regarding Risks and Benefits of her scheduled surgery.  LEEP The LEEP has been explained to the patient in detail; risks/benefits reviewed.  The risks include, but are not limited to, bleeding, infection, and the possibility of cervical stenosis or cervical incompetence.  The patient had previously been given information regarding abnormal PAP smears and their relationship to HPV.  We have discussed the natural course and history of HPV, the possibility of incomplete treatment by the LEEP, as well as the possibility of recurrence.  I have reviewed the consent form for LEEP with her, and she fully understands its contents.  We have discussed the procedure itself. I have informed her that following the LEEP she should refrain from intercourse and the use of tampons for three weeks, and that she should also expect some spotting and brown/black discharge over the next several days.  We have discussed the fact that vaginal bleeding, differentiated from spotting, is not normal and that if she should have this complication, she should contact me immediately.  The follow-up after LEEP will be PAP smears or viral typing performed at regular intervals for up to 3 years.  Should these all prove to be normal, she will then be back on typical cervical screening.  I have answered all of her questions, and I believe she has an adequate understanding of the  LEEP, its implications, and the necessity of follow-up care.

## 2019-02-09 ENCOUNTER — Encounter
Admission: RE | Admit: 2019-02-09 | Discharge: 2019-02-09 | Disposition: A | Discharge status: Home / Self Care | Payer: Medicaid Other | Source: Ambulatory Visit | Attending: Obstetrics and Gynecology | Admitting: Obstetrics and Gynecology

## 2019-02-09 ENCOUNTER — Other Ambulatory Visit: Payer: Self-pay

## 2019-02-09 DIAGNOSIS — I110 Hypertensive heart disease with heart failure: Secondary | ICD-10-CM | POA: Insufficient documentation

## 2019-02-09 DIAGNOSIS — Z01818 Encounter for other preprocedural examination: Secondary | ICD-10-CM | POA: Diagnosis present

## 2019-02-09 DIAGNOSIS — I509 Heart failure, unspecified: Secondary | ICD-10-CM | POA: Insufficient documentation

## 2019-02-09 DIAGNOSIS — Z01810 Encounter for preprocedural cardiovascular examination: Secondary | ICD-10-CM | POA: Diagnosis not present

## 2019-02-09 HISTORY — DX: Personal history of urinary calculi: Z87.442

## 2019-02-09 HISTORY — DX: Anemia, unspecified: D64.9

## 2019-02-09 LAB — CBC
HCT: 39.7 % (ref 36.0–46.0)
Hemoglobin: 13.3 g/dL (ref 12.0–15.0)
MCH: 31.7 pg (ref 26.0–34.0)
MCHC: 33.5 g/dL (ref 30.0–36.0)
MCV: 94.7 fL (ref 80.0–100.0)
Platelets: 319 10*3/uL (ref 150–400)
RBC: 4.19 MIL/uL (ref 3.87–5.11)
RDW: 13.1 % (ref 11.5–15.5)
WBC: 10.7 10*3/uL — ABNORMAL HIGH (ref 4.0–10.5)
nRBC: 0 % (ref 0.0–0.2)

## 2019-02-09 LAB — BASIC METABOLIC PANEL
Anion gap: 11 (ref 5–15)
BUN: 19 mg/dL (ref 6–20)
CO2: 24 mmol/L (ref 22–32)
Calcium: 9.5 mg/dL (ref 8.9–10.3)
Chloride: 104 mmol/L (ref 98–111)
Creatinine, Ser: 1.6 mg/dL — ABNORMAL HIGH (ref 0.44–1.00)
GFR calc Af Amer: 44 mL/min — ABNORMAL LOW (ref >60)
GFR calc non Af Amer: 38 mL/min — ABNORMAL LOW (ref >60)
Glucose, Bld: 110 mg/dL — ABNORMAL HIGH (ref 70–99)
Potassium: 3.6 mmol/L (ref 3.5–5.1)
Sodium: 139 mmol/L (ref 135–145)

## 2019-02-09 LAB — TYPE AND SCREEN
ABO/RH(D): O POS
Antibody Screen: NEGATIVE

## 2019-02-09 LAB — HCG, QUANTITATIVE, PREGNANCY: hCG, Beta Chain, Quant, S: 1 m[IU]/mL (ref <5)

## 2019-02-09 NOTE — Patient Instructions (Signed)
Your procedure is scheduled on: 02-16-19 MONDAY Report to Same Day Surgery 2nd floor medical mall [facility] Entrance-take elevator on left to 2nd floor.  Check in with surgery information desk.) To find out your arrival time please call [phone] between 1PM - 3PM on 02-13-19 FRIDAY  Remember: Instructions that are not followed completely may result in serious medical risk, up to and including death, or upon the discretion of your surgeon and anesthesiologist your surgery may need to be rescheduled.    _x___ 1. Do not eat food after midnight the night before your procedure. NO GUM OR CANDY AFTER MIDNIGHT. You may drink clear liquids up to 2 hours before you are scheduled to arrive at the hospital for your procedure.  Do not drink clear liquids within 2 hours of your scheduled arrival to the hospital.  Clear liquids include  --Water or Apple juice without pulp  --Clear carbohydrate beverage such as ClearFast or Gatorade  --Black Coffee or Clear Tea (No milk, no creamers, do not add anything to  the coffee or Tea   ____Ensure clear carbohydrate drink on the way to the hospital for bariatric patients  ____Ensure clear carbohydrate drink 3 hours before surgery.    __x__ 2. No Alcohol for 24 hours before or after surgery.   __x__3. No Smoking or e-cigarettes for 24 prior to surgery.  Do not use any chewable tobacco products for at least 6 hour prior to surgery   ____  4. Bring all medications with you on the day of surgery if instructed.    __x__ 5. Notify your doctor if there is any change in your medical condition     (cold, fever, infections).    x___6. On the morning of surgery brush your teeth with toothpaste and water.  You may rinse your mouth with mouth wash if you wish.  Do not swallow any toothpaste or mouthwash.   Do not wear jewelry, make-up, hairpins, clips or nail polish.  Do not wear lotions, powders, or perfumes. You may wear deodorant.  Do not shave 48 hours prior  to surgery. Men may shave face and neck.  Do not bring valuables to the hospital.    [facility] is not responsible for any belongings or valuables.               Contacts, dentures or bridgework may not be worn into surgery.  Leave your suitcase in the car. After surgery it may be brought to your room.  For patients admitted to the hospital, discharge time is determined by your treatment team.  _  Patients discharged the day of surgery will not be allowed to drive home.  You will need someone to drive you home and stay with you the night of your procedure.    Please read over the following fact sheets that you were given:   [facility] Preparing for Surgery and or MRSA Information   _x___ TAKE THE FOLLOWING MEDICATION THE MORNING OF SURGERY WITH A SMALL SIP OF WATER. These include:  1. WELLBUTRIN (BUPROPION)  2. COREG (CARVEDILOL)  3. KLONOPIN (CLONAZEPAM)  4. PEPCID (FAMOTIDINE)  5. LEVOTHYROXINE (SYNTHROID)  6. MECLIZINE (ANTIVERT)  ____Fleets enema or Magnesium Citrate as directed.   ____ Use CHG Soap or sage wipes as directed on instruction sheet   _X___ Use inhalers on the day of surgery and bring to hospital day of surgery-USE YOUR ALBUTEROL INHALER AM OF SURGERY AND [facility]  ____ Stop Metformin and Janumet  2 days prior to surgery.    ____ Take 1/2 of usual insulin dose the night before surgery and none on the morning surgery.   ____ Follow recommendations from Cardiologist, Pulmonologist or PCP regarding  stopping Aspirin, Coumadin, Plavix ,Eliquis, Effient, or Pradaxa, and Pletal.  _X___Stop Anti-inflammatories such as Advil, Aleve, Ibuprofen, Motrin, Naproxen, Naprosyn, Goodies powders or aspirin products NOW-OK to take Tylenol    ____ Stop supplements until after surgery.     ____ Bring C-Pap to the hospital.

## 2019-02-10 ENCOUNTER — Telehealth: Payer: Self-pay | Admitting: Obstetrics and Gynecology

## 2019-02-10 NOTE — Telephone Encounter (Signed)
I have placed clearance on your desk for this patient. I have notified patient that we did receive the forms.

## 2019-02-10 NOTE — Telephone Encounter (Signed)
Dr. Humphrey Rolls is supposed to be faxing over a release form to clear patient to have surgery.

## 2019-02-10 NOTE — Telephone Encounter (Signed)
The patient called and stated that she needs to speak with her nurse today if possible in regards to her surgery and needing some forms completed by Dr. Amalia Hailey. Please advise.

## 2019-02-12 ENCOUNTER — Other Ambulatory Visit: Payer: Self-pay

## 2019-02-12 ENCOUNTER — Other Ambulatory Visit
Admission: RE | Admit: 2019-02-12 | Discharge: 2019-02-12 | Disposition: A | Discharge status: Home / Self Care | Payer: Medicaid Other | Source: Ambulatory Visit | Attending: Obstetrics and Gynecology | Admitting: Obstetrics and Gynecology

## 2019-02-12 DIAGNOSIS — N871 Moderate cervical dysplasia: Secondary | ICD-10-CM | POA: Diagnosis not present

## 2019-02-12 DIAGNOSIS — Z20828 Contact with and (suspected) exposure to other viral communicable diseases: Secondary | ICD-10-CM | POA: Insufficient documentation

## 2019-02-12 DIAGNOSIS — Z01812 Encounter for preprocedural laboratory examination: Secondary | ICD-10-CM | POA: Diagnosis not present

## 2019-02-12 LAB — SARS CORONAVIRUS 2 (TAT 6-24 HRS): SARS Coronavirus 2: NEGATIVE

## 2019-02-13 NOTE — Pre-Procedure Instructions (Signed)
CARDIAC CLEARANCE ON CHART FROM DR Laurelyn Sickle

## 2019-02-16 ENCOUNTER — Ambulatory Visit: Payer: Medicaid Other | Admitting: Anesthesiology

## 2019-02-16 ENCOUNTER — Ambulatory Visit
Admission: RE | Admit: 2019-02-16 | Discharge: 2019-02-16 | Disposition: A | Discharge status: Home / Self Care | Payer: Medicaid Other | Attending: Obstetrics and Gynecology | Admitting: Obstetrics and Gynecology

## 2019-02-16 ENCOUNTER — Other Ambulatory Visit: Payer: Self-pay

## 2019-02-16 ENCOUNTER — Encounter
Admission: RE | Disposition: A | Discharge status: Home / Self Care | Payer: Self-pay | Source: Home / Self Care | Attending: Obstetrics and Gynecology

## 2019-02-16 ENCOUNTER — Encounter: Payer: Self-pay | Admitting: *Deleted

## 2019-02-16 DIAGNOSIS — R87613 High grade squamous intraepithelial lesion on cytologic smear of cervix (HGSIL): Secondary | ICD-10-CM | POA: Diagnosis not present

## 2019-02-16 DIAGNOSIS — K219 Gastro-esophageal reflux disease without esophagitis: Secondary | ICD-10-CM | POA: Diagnosis not present

## 2019-02-16 DIAGNOSIS — Z79899 Other long term (current) drug therapy: Secondary | ICD-10-CM | POA: Insufficient documentation

## 2019-02-16 DIAGNOSIS — J449 Chronic obstructive pulmonary disease, unspecified: Secondary | ICD-10-CM | POA: Insufficient documentation

## 2019-02-16 DIAGNOSIS — F17210 Nicotine dependence, cigarettes, uncomplicated: Secondary | ICD-10-CM | POA: Insufficient documentation

## 2019-02-16 DIAGNOSIS — E039 Hypothyroidism, unspecified: Secondary | ICD-10-CM | POA: Diagnosis not present

## 2019-02-16 DIAGNOSIS — Z79890 Hormone replacement therapy (postmenopausal): Secondary | ICD-10-CM | POA: Insufficient documentation

## 2019-02-16 DIAGNOSIS — I110 Hypertensive heart disease with heart failure: Secondary | ICD-10-CM | POA: Insufficient documentation

## 2019-02-16 DIAGNOSIS — F329 Major depressive disorder, single episode, unspecified: Secondary | ICD-10-CM | POA: Diagnosis not present

## 2019-02-16 DIAGNOSIS — F419 Anxiety disorder, unspecified: Secondary | ICD-10-CM | POA: Insufficient documentation

## 2019-02-16 DIAGNOSIS — N871 Moderate cervical dysplasia: Secondary | ICD-10-CM | POA: Diagnosis not present

## 2019-02-16 DIAGNOSIS — Z7951 Long term (current) use of inhaled steroids: Secondary | ICD-10-CM | POA: Insufficient documentation

## 2019-02-16 DIAGNOSIS — I509 Heart failure, unspecified: Secondary | ICD-10-CM | POA: Diagnosis not present

## 2019-02-16 HISTORY — PX: LEEP: SHX91

## 2019-02-16 LAB — ABO/RH: ABO/RH(D): O POS

## 2019-02-16 LAB — POCT PREGNANCY, URINE: Preg Test, Ur: NEGATIVE

## 2019-02-16 SURGERY — LEEP (LOOP ELECTROSURGICAL EXCISION PROCEDURE)
Anesthesia: General

## 2019-02-16 MED ORDER — ONDANSETRON HCL 4 MG/2ML IJ SOLN
INTRAMUSCULAR | Status: DC | PRN
  Administered 2019-02-16: 4 mg via INTRAVENOUS

## 2019-02-16 MED ORDER — ONDANSETRON HCL 4 MG/2ML IJ SOLN
INTRAMUSCULAR | Status: AC
  Filled 2019-02-16: qty 2

## 2019-02-16 MED ORDER — ONDANSETRON HCL 4 MG/2ML IJ SOLN: 4.0000 mg | Freq: Four times a day (QID) | INTRAMUSCULAR | Status: DC | PRN

## 2019-02-16 MED ORDER — FENTANYL CITRATE (PF) 100 MCG/2ML IJ SOLN
25.0000 ug | INTRAMUSCULAR | Status: DC | PRN
  Administered 2019-02-16: 25 ug via INTRAVENOUS

## 2019-02-16 MED ORDER — DEXAMETHASONE SODIUM PHOSPHATE 10 MG/ML IJ SOLN
INTRAMUSCULAR | Status: DC | PRN
  Administered 2019-02-16: 8 mg via INTRAVENOUS

## 2019-02-16 MED ORDER — KETOROLAC TROMETHAMINE 30 MG/ML IJ SOLN
INTRAMUSCULAR | Status: AC
  Administered 2019-02-16: 30 mg via INTRAVENOUS
  Filled 2019-02-16: qty 1

## 2019-02-16 MED ORDER — FENTANYL CITRATE (PF) 100 MCG/2ML IJ SOLN
INTRAMUSCULAR | Status: AC
  Filled 2019-02-16: qty 2

## 2019-02-16 MED ORDER — LIDOCAINE-EPINEPHRINE 1 %-1:100000 IJ SOLN
INTRAMUSCULAR | Status: AC
  Filled 2019-02-16: qty 1

## 2019-02-16 MED ORDER — OXYCODONE HCL 5 MG PO TABS: 5.0000 mg | ORAL_TABLET | Freq: Once | ORAL | Status: DC | PRN

## 2019-02-16 MED ORDER — FERRIC SUBSULFATE 259 MG/GM EX SOLN
CUTANEOUS | Status: DC | PRN
  Administered 2019-02-16: 1 via TOPICAL

## 2019-02-16 MED ORDER — LACTATED RINGERS IV SOLN
INTRAVENOUS | Status: DC
  Administered 2019-02-16: 09:00:00 via INTRAVENOUS

## 2019-02-16 MED ORDER — IODINE STRONG (LUGOLS) 5 % PO SOLN
ORAL | Status: DC | PRN
  Administered 2019-02-16: 5 mL

## 2019-02-16 MED ORDER — PROMETHAZINE HCL 25 MG/ML IJ SOLN: 6.2500 mg | INTRAMUSCULAR | Status: DC | PRN

## 2019-02-16 MED ORDER — LIDOCAINE HCL (PF) 2 % IJ SOLN
INTRAMUSCULAR | Status: AC
  Filled 2019-02-16: qty 10

## 2019-02-16 MED ORDER — LIDOCAINE HCL (CARDIAC) PF 100 MG/5ML IV SOSY
PREFILLED_SYRINGE | INTRAVENOUS | Status: DC | PRN
  Administered 2019-02-16: 80 mg via INTRAVENOUS

## 2019-02-16 MED ORDER — KETOROLAC TROMETHAMINE 30 MG/ML IJ SOLN
30.0000 mg | Freq: Once | INTRAMUSCULAR | Status: AC
  Administered 2019-02-16: 30 mg via INTRAVENOUS
  Filled 2019-02-16: qty 1

## 2019-02-16 MED ORDER — IODINE STRONG (LUGOLS) 5 % PO SOLN
ORAL | Status: AC
  Filled 2019-02-16: qty 1

## 2019-02-16 MED ORDER — ONDANSETRON 4 MG PO TBDP: 4.0000 mg | ORAL_TABLET | Freq: Four times a day (QID) | ORAL | Status: DC | PRN

## 2019-02-16 MED ORDER — MEPERIDINE HCL 50 MG/ML IJ SOLN: 6.2500 mg | INTRAMUSCULAR | Status: DC | PRN

## 2019-02-16 MED ORDER — FERRIC SUBSULFATE 259 MG/GM EX SOLN
CUTANEOUS | Status: AC
  Filled 2019-02-16: qty 8

## 2019-02-16 MED ORDER — MIDAZOLAM HCL 2 MG/2ML IJ SOLN
INTRAMUSCULAR | Status: AC
  Filled 2019-02-16: qty 2

## 2019-02-16 MED ORDER — LACTATED RINGERS IV SOLN: INTRAVENOUS | Status: DC

## 2019-02-16 MED ORDER — OXYCODONE HCL 5 MG/5ML PO SOLN: 5.0000 mg | Freq: Once | ORAL | Status: DC | PRN

## 2019-02-16 MED ORDER — DEXAMETHASONE SODIUM PHOSPHATE 10 MG/ML IJ SOLN
INTRAMUSCULAR | Status: AC
  Filled 2019-02-16: qty 1

## 2019-02-16 MED ORDER — DEXTROSE IN LACTATED RINGERS 5 % IV SOLN: INTRAVENOUS | Status: DC

## 2019-02-16 MED ORDER — FENTANYL CITRATE (PF) 100 MCG/2ML IJ SOLN
INTRAMUSCULAR | Status: DC | PRN
  Administered 2019-02-16 (×2): 25 ug via INTRAVENOUS
  Administered 2019-02-16: 50 ug via INTRAVENOUS

## 2019-02-16 MED ORDER — MIDAZOLAM HCL 2 MG/2ML IJ SOLN
INTRAMUSCULAR | Status: DC | PRN
  Administered 2019-02-16: 2 mg via INTRAVENOUS

## 2019-02-16 MED ORDER — PROPOFOL 10 MG/ML IV BOLUS
INTRAVENOUS | Status: DC | PRN
  Administered 2019-02-16: 180 mg via INTRAVENOUS

## 2019-02-16 SURGICAL SUPPLY — 33 items
APPLICATOR COTTON TIP 6 STRL (MISCELLANEOUS) ×1 IMPLANT
APPLICATOR COTTON TIP 6IN STRL (MISCELLANEOUS) ×6 IMPLANT
APPLICATOR SWAB PROCTO LG 16IN (MISCELLANEOUS) ×3 IMPLANT
CANISTER SUCT 1200ML W/VALVE (MISCELLANEOUS) ×3 IMPLANT
CATH ROBINSON RED A/P 16FR (CATHETERS) ×3 IMPLANT
DRAPE UNDER BUTTOCK W/FLU (DRAPES) ×3 IMPLANT
DRSG TELFA 3X8 NADH (GAUZE/BANDAGES/DRESSINGS) ×3 IMPLANT
ELECT LEEP BALL 5MM 12CM (MISCELLANEOUS) ×3
ELECT LEEP LOOP 1.0CM .7CM (MISCELLANEOUS) ×3
ELECT LEEP LOOP 20X10 R2010 (MISCELLANEOUS) ×3
ELECT LOOP 1.0X1.0CM R1010 (MISCELLANEOUS) ×3
ELECT REM PT RETURN 9FT ADLT (ELECTROSURGICAL) ×3
ELECTRODE LEEP BALL 5MM 12CM (MISCELLANEOUS) ×1 IMPLANT
ELECTRODE LEEP LOOP 1.0CM .7CM (MISCELLANEOUS) ×1 IMPLANT
ELECTRODE LEP LOOP 20X10 R2010 (MISCELLANEOUS) ×1 IMPLANT
ELECTRODE LOOP 1.0X1.0CM R1010 (MISCELLANEOUS) ×1 IMPLANT
ELECTRODE REM PT RTRN 9FT ADLT (ELECTROSURGICAL) ×1 IMPLANT
GLOVE BIOGEL PI ORTHO PRO 7.5 (GLOVE) ×2
GLOVE INDICATOR 7.0 STRL GRN (GLOVE) ×3 IMPLANT
GLOVE PI ORTHO PRO STRL 7.5 (GLOVE) ×1 IMPLANT
GOWN STRL REUS W/ TWL LRG LVL3 (GOWN DISPOSABLE) ×2 IMPLANT
GOWN STRL REUS W/TWL LRG LVL3 (GOWN DISPOSABLE) ×4
HANDLE YANKAUER SUCT BULB TIP (MISCELLANEOUS) ×3 IMPLANT
KIT TURNOVER CYSTO (KITS) ×3 IMPLANT
LABEL OR SOLS (LABEL) ×3 IMPLANT
NEEDLE SPNL 22GX3.5 QUINCKE BK (NEEDLE) ×3 IMPLANT
PACK DNC HYST (MISCELLANEOUS) ×3 IMPLANT
PAD PREP 24X41 OB/GYN DISP (PERSONAL CARE ITEMS) ×3 IMPLANT
PENCIL ELECTRO HAND CTR (MISCELLANEOUS) ×3 IMPLANT
SOL PREP PVP 2OZ (MISCELLANEOUS) ×3
SOLUTION PREP PVP 2OZ (MISCELLANEOUS) ×1 IMPLANT
STRAW SMOKE EVAC LEEP 6150 NON (MISCELLANEOUS) ×3 IMPLANT
SYR 10ML LL (SYRINGE) ×3 IMPLANT

## 2019-02-16 NOTE — Anesthesia Procedure Notes (Signed)
Procedure Name: LMA Insertion Date/Time: 02/16/2019 11:51 AM Performed by: Hedda Slade, CRNA Pre-anesthesia Checklist: Patient identified, Patient being monitored, Timeout performed, Emergency Drugs available and Suction available Patient Re-evaluated:Patient Re-evaluated prior to induction Oxygen Delivery Method: Circle system utilized Preoxygenation: Pre-oxygenation with 100% oxygen Induction Type: IV induction Ventilation: Mask ventilation without difficulty LMA: LMA inserted LMA Size: 3.5 Tube type: Oral Number of attempts: 1 Placement Confirmation: positive ETCO2 and breath sounds checked- equal and bilateral Tube secured with: Tape Dental Injury: Teeth and Oropharynx as per pre-operative assessment

## 2019-02-16 NOTE — Anesthesia Post-op Follow-up Note (Signed)
Anesthesia QCDR form completed.        

## 2019-02-16 NOTE — Op Note (Signed)
    OPERATIVE NOTE 02/16/2019 12:38 PM  PRE-OPERATIVE DIAGNOSIS:  1) CIN 2  POST-OPERATIVE DIAGNOSIS:  CIN 2  OPERATION: LEEP  SURGEON(S): Surgeon(s) and Role:    Harlin Heys, MD - Primary   ANESTHESIA: General  ESTIMATED BLOOD LOSS: 15 mL  OPERATIVE FINDINGS:   SPECIMEN:  ID Type Source Tests Collected by Time Destination  1 : posterior cervix Tissue [facility] Gyn biopsy SURGICAL PATHOLOGY Harlin Heys, MD 02/16/2019 1215   2 : anterior cervix Tissue [facility] Gyn biopsy SURGICAL PATHOLOGY Harlin Heys, MD 02/16/2019 1215   3 : left cervix Tissue [facility] Gyn biopsy SURGICAL PATHOLOGY Harlin Heys, MD 02/16/2019 1216   4 : right cervix Tissue [facility] Gyn biopsy SURGICAL PATHOLOGY Harlin Heys, MD 02/16/2019 1216   5 : cervical canal Tissue [facility] Gyn biopsy SURGICAL PATHOLOGY Harlin Heys, MD [id number]     COMPLICATIONS: None  DRAINS: Foley to gravity  DISPOSITION: Stable to recovery room  DESCRIPTION OF PROCEDURE:      The patient was prepped and draped. Lugol's solution was used to identify any abnormal areas of the cervix.  These were compared with the previous colpo pictures and Colposcopy findings. Hartline current was used to remove the specimen.  It was labeled accordingly. The base and edges of the defect were then cauterized using coagulation current. Monsel's solution was then applied in the usual manner.  No follow-up provider specified.  Finis Bud, M.D. 02/16/2019 12:38 PM

## 2019-02-16 NOTE — Anesthesia Postprocedure Evaluation (Signed)
Anesthesia Post Note  Patient: JAICEE WALLMAN  Procedure(s) Performed: LOOP ELECTROSURGICAL EXCISION PROCEDURE (LEEP) (N/A )  Patient location during evaluation: PACU Anesthesia Type: General Level of consciousness: awake and alert Pain management: pain level controlled Vital Signs Assessment: post-procedure vital signs reviewed and stable Respiratory status: spontaneous breathing, nonlabored ventilation, respiratory function stable and patient connected to nasal cannula oxygen Cardiovascular status: blood pressure returned to baseline and stable Postop Assessment: no apparent nausea or vomiting Anesthetic complications: no     Last Vitals:  Vitals:   02/16/19 1334 02/16/19 1345  BP: 114/73 123/68  Pulse:  69  Resp:  18  Temp: 36.4 C (!) 36.1 C  SpO2:  97%    Last Pain:  Vitals:   02/16/19 1345  TempSrc: Temporal  PainSc: 4                  Martha Clan

## 2019-02-16 NOTE — Interval H&P Note (Signed)
History and Physical Interval Note:  02/16/2019 11:36 AM  Grace Fox  has presented today for surgery, with the diagnosis of CIN 2.  The various methods of treatment have been discussed with the patient and family. After consideration of risks, benefits and other options for treatment, the patient has consented to  Procedure(s): LOOP ELECTROSURGICAL EXCISION PROCEDURE (LEEP) (N/A) as a surgical intervention.  The patient's history has been reviewed, patient examined, no change in status, stable for surgery.  I have reviewed the patient's chart and labs.  Questions were answered to the patient's satisfaction.     Jeannie Fend

## 2019-02-16 NOTE — Transfer of Care (Signed)
Immediate Anesthesia Transfer of Care Note  Patient: Harrel Carina  Procedure(s) Performed: LOOP ELECTROSURGICAL EXCISION PROCEDURE (LEEP) (N/A )  Patient Location: PACU  Anesthesia Type:General  Level of Consciousness: sedated  Airway & Oxygen Therapy: Patient Spontanous Breathing and Patient connected to face mask oxygen  Post-op Assessment: Report given to RN and Post -op Vital signs reviewed and stable  Post vital signs: Reviewed and stable  Last Vitals:  Vitals Value Taken Time  BP 125/82 02/16/19 1236  Temp 36.4 C 02/16/19 1236  Pulse 170 02/16/19 1239  Resp 26 02/16/19 1239  SpO2 80 % 02/16/19 1239  Vitals shown include unvalidated device data.  Last Pain:  Vitals:   02/16/19 0910  TempSrc: Tympanic  PainSc: 0-No pain         Complications: No apparent anesthesia complications

## 2019-02-16 NOTE — Discharge Instructions (Signed)

## 2019-02-16 NOTE — Anesthesia Preprocedure Evaluation (Signed)
Anesthesia Evaluation  Patient identified by MRN, date of birth, ID band Patient awake    Reviewed: Allergy & Precautions, NPO status , Patient's Chart, lab work & pertinent test results  History of Anesthesia Complications Negative for: history of anesthetic complications  Airway Mallampati: III  TM Distance: >3 FB Neck ROM: Full    Dental  (+) Poor Dentition   Pulmonary COPD (wears O2 at night),  oxygen dependent, Current Smoker,    breath sounds clear to auscultation- rhonchi (-) wheezing      Cardiovascular hypertension, +CHF (EF 55%)  (-) CAD, (-) Past MI, (-) Cardiac Stents and (-) CABG + Valvular Problems/Murmurs AI  Rhythm:Regular Rate:Normal - Systolic murmurs and - Diastolic murmurs    Neuro/Psych  Headaches, neg Seizures PSYCHIATRIC DISORDERS Anxiety Depression    GI/Hepatic Neg liver ROS, GERD  ,  Endo/Other  neg diabetesHypothyroidism   Renal/GU Renal InsufficiencyRenal disease     Musculoskeletal negative musculoskeletal ROS (+)   Abdominal (+) + obese,   Peds  Hematology  (+) anemia ,   Anesthesia Other Findings Past Medical History: No date: Anemia No date: Anxiety No date: Aortic regurgitation     Comment:  PT STATES SHE NEEDS VALVE REPLACMENT-SEE DR Neoma Laming No date: Back pain     Comment:  SCIATICA No date: CHF (congestive heart failure) (HCC) No date: COPD (chronic obstructive pulmonary disease) (HCC) No date: Depression No date: Dyspnea     Comment:  DOE No date: Gall stones No date: GERD (gastroesophageal reflux disease) No date: Headache     Comment:  migraines No date: History of kidney stones     Comment:  h/o No date: Hypertension No date: Hypothyroidism No date: Kidney atrophy     Comment:  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 No date: Oxygen deficit     Comment:  2L/ HS No date: Pneumonia No date: Thyroid disease No date: Vertigo   Reproductive/Obstetrics                             Anesthesia Physical Anesthesia Plan  ASA: III  Anesthesia Plan: General   Post-op Pain Management:    Induction: Intravenous  PONV Risk Score and Plan: 1 and Ondansetron and Midazolam  Airway Management Planned: Oral ETT  Additional Equipment:   Intra-op Plan:   Post-operative Plan: Extubation in OR  Informed Consent: I have reviewed the patients History and Physical, chart, labs and discussed the procedure including the risks, benefits and alternatives for the proposed anesthesia with the patient or authorized representative who has indicated his/her understanding and acceptance.     Dental advisory given  Plan Discussed with: CRNA and Anesthesiologist  Anesthesia Plan Comments:         Anesthesia Quick Evaluation

## 2019-02-17 ENCOUNTER — Telehealth: Payer: Self-pay | Admitting: Obstetrics and Gynecology

## 2019-02-17 ENCOUNTER — Encounter: Payer: Self-pay | Admitting: Obstetrics and Gynecology

## 2019-02-17 NOTE — Telephone Encounter (Signed)
Please advise 

## 2019-02-17 NOTE — Telephone Encounter (Signed)
The pt called and stated that she had a leep done yesterday and she is still experiencing stinging and cramping. The patient stated that extra strength tylenol is not helping she is taking 2 500 mg every 4 hours. Pt requesting call back and also stated that between 1:30-2pm she has a televisit w/ a different provider. Please advise.

## 2019-02-18 LAB — SURGICAL PATHOLOGY

## 2019-02-19 NOTE — Telephone Encounter (Signed)
Patient stated that she can't take ibuprofen and the tylenol is not helping. She is still in a lot of pain. Do you want me to have her come in?

## 2019-03-17 ENCOUNTER — Encounter: Payer: Medicaid Other | Admitting: Obstetrics and Gynecology

## 2019-03-26 ENCOUNTER — Encounter: Payer: Medicaid Other | Admitting: Obstetrics and Gynecology

## 2019-04-07 ENCOUNTER — Ambulatory Visit (INDEPENDENT_AMBULATORY_CARE_PROVIDER_SITE_OTHER): Payer: Medicaid Other | Admitting: Obstetrics and Gynecology

## 2019-04-07 ENCOUNTER — Other Ambulatory Visit: Payer: Self-pay

## 2019-04-07 ENCOUNTER — Encounter: Payer: Self-pay | Admitting: Obstetrics and Gynecology

## 2019-04-07 VITALS — BP 114/74 | HR 90 | Wt 241.6 lb

## 2019-04-07 DIAGNOSIS — N871 Moderate cervical dysplasia: Secondary | ICD-10-CM | POA: Diagnosis not present

## 2019-04-07 NOTE — Progress Notes (Signed)
HPI:      [name] is a 48 y.o. [phone] who LMP was No LMP recorded. (Menstrual status: IUD).  Subjective:   She presents today for follow-up after her LEEP.  She reports that she is doing well.  Not having any further bleeding.  Has had intercourse without issue.    Hx: The following portions of the patient's history were reviewed and updated as appropriate:             She  has a past medical history of Anemia, Anxiety, Aortic regurgitation, Back pain, CHF (congestive heart failure) ([place]), COPD (chronic obstructive pulmonary disease) ([place]), Depression, Dyspnea, Gall stones, GERD (gastroesophageal reflux disease), Headache, History of kidney stones, Hypertension, Hypothyroidism, Kidney atrophy, Oxygen deficit, Pneumonia, Thyroid disease, and Vertigo. She does not have any pertinent problems on file. She  has a past surgical history that includes Cholecystectomy; Cholecystectomy (N/A, 02/28/2018); and LEEP (N/A, 02/16/2019). Her family history includes Cancer in her father and paternal grandmother; Diabetes in an other family member; Heart failure in an other family member; Hypertension in her maternal grandfather; Stroke in her maternal grandfather; Thyroid disease in her mother. She  reports that she has been smoking cigarettes. She has a 20.00 pack-year smoking history. She has never used smokeless tobacco. She reports that she does not drink alcohol or use drugs. She has a current medication list which includes the following prescription(s): albuterol, budesonide-formoterol, bupropion, carvedilol, cholecalciferol, clonazepam, ezetimibe, famotidine, ferrous sulfate, furosemide, levocetirizine, levothyroxine, losartan, meclizine, oxygen-helium, potassium chloride sa, simvastatin, sucralfate, and sertraline. She is allergic to lactose intolerance (gi).       Review of Systems:  Review of Systems  Constitutional: Denied constitutional symptoms, night sweats, recent illness, fatigue,  fever, insomnia and weight loss.  Eyes: Denied eye symptoms, eye pain, photophobia, vision change and visual disturbance.  Ears/Nose/Throat/Neck: Denied ear, nose, throat or neck symptoms, hearing loss, nasal discharge, sinus congestion and sore throat.  Cardiovascular: Denied cardiovascular symptoms, arrhythmia, chest pain/pressure, edema, exercise intolerance, orthopnea and palpitations.  Respiratory: Denied pulmonary symptoms, asthma, pleuritic pain, productive sputum, cough, dyspnea and wheezing.  Gastrointestinal: Denied, gastro-esophageal reflux, melena, nausea and vomiting.  Genitourinary: Denied genitourinary symptoms including symptomatic vaginal discharge, pelvic relaxation issues, and urinary complaints.  Musculoskeletal: Denied musculoskeletal symptoms, stiffness, swelling, muscle weakness and myalgia.  Dermatologic: Denied dermatology symptoms, rash and scar.  Neurologic: Denied neurology symptoms, dizziness, headache, neck pain and syncope.  Psychiatric: Denied psychiatric symptoms, anxiety and depression.  Endocrine: Denied endocrine symptoms including hot flashes and night sweats.   Meds:   Current Outpatient Medications on File Prior to Visit  Medication Sig Dispense Refill  . albuterol (PROVENTIL HFA;VENTOLIN HFA) 108 (90 BASE) MCG/ACT inhaler Inhale 2 puffs into the lungs every 4 (four) hours as needed for wheezing or shortness of breath.    . budesonide-formoterol (SYMBICORT) 160-4.5 MCG/ACT inhaler Inhale 2 puffs into the lungs 2 (two) times daily as needed (shortness of breath).     Marland Kitchen buPROPion (WELLBUTRIN XL) 300 MG 24 hr tablet Take 300 mg by mouth every morning.   2  . carvedilol (COREG) 6.25 MG tablet Take 6.25 mg by mouth 2 (two) times daily with a meal.    . cholecalciferol (VITAMIN D3) 25 MCG (1000 UT) tablet Take 1,000 Units by mouth daily.    . clonazePAM (KLONOPIN) 1 MG tablet Take 1 mg by mouth daily. And prn-typically takes around lunch    . ezetimibe (ZETIA)  10 MG tablet Take 10 mg by mouth  at bedtime.   2  . famotidine (PEPCID) 20 MG tablet Take 20 mg by mouth 2 (two) times daily.    . ferrous sulfate 325 (65 FE) MG tablet Take 325 mg by mouth daily with breakfast.    . furosemide (LASIX) 20 MG tablet Take 40 mg by mouth every morning.     Marland Kitchen levocetirizine (XYZAL) 5 MG tablet Take 5 mg by mouth every evening.    Marland Kitchen levothyroxine (SYNTHROID) 50 MCG tablet Take 50 mcg by mouth daily before breakfast.     . losartan (COZAAR) 25 MG tablet Take 25 mg by mouth every morning.     . meclizine (ANTIVERT) 25 MG tablet Take 25 mg by mouth 2 (two) times daily.   2  . OXYGEN Inhale 2 L into the lungs at bedtime.    . potassium chloride SA (K-DUR,KLOR-CON) 20 MEQ tablet Take 20 mEq by mouth daily.     . simvastatin (ZOCOR) 40 MG tablet Take 40 mg by mouth at bedtime.     . sucralfate (CARAFATE) 1 g tablet Take 1 tablet (1 g total) by mouth 4 (four) times daily. (Patient taking differently: Take 1 g by mouth 2 (two) times daily. But can take up to qid) 60 tablet 0  . sertraline (ZOLOFT) 50 MG tablet      No current facility-administered medications on file prior to visit.     Objective:     Vitals:   04/07/19 1131  BP: 114/74  Pulse: 90                Assessment:    [id number] Patient Active Problem List   Diagnosis Date Noted  . S/P laparoscopic cholecystectomy 02/28/2018  . Gallstones 02/28/2018  . Malnutrition of moderate degree 06/30/2015  . RUQ abdominal pain   . Acute cholecystitis 06/28/2015  . Allergic reaction 05/26/2015     1. CIN II (cervical intraepithelial neoplasia II)     We discussed the findings from the LEEP specimens, size of specimens margins etc.   Plan:            1.  Recommend follow-up Pap smear in 2 months.  We discussed the possibility of remaining dysplasia and management if that is the case.  All questions answered. Orders No orders of the defined types were placed in this encounter.   No orders of the  defined types were placed in this encounter.     F/U  Return in about 2 months (around 06/07/2019). I spent 12 minutes involved in the care of this patient of which greater than 50% was spent discussing LEEP, findings at LEEP, margins, future follow-ups.  Finis Bud, M.D. 04/07/2019 11:45 AM

## 2019-04-07 NOTE — Progress Notes (Signed)
Patient comes in today for post op appointment. No concerns today.

## 2019-05-27 ENCOUNTER — Other Ambulatory Visit: Payer: Self-pay

## 2019-05-27 ENCOUNTER — Encounter: Payer: Self-pay | Admitting: Obstetrics and Gynecology

## 2019-05-27 ENCOUNTER — Ambulatory Visit (INDEPENDENT_AMBULATORY_CARE_PROVIDER_SITE_OTHER): Payer: Medicaid Other | Admitting: Obstetrics and Gynecology

## 2019-05-27 VITALS — BP 112/77 | HR 94 | Ht 65.0 in | Wt 240.0 lb

## 2019-05-27 DIAGNOSIS — Z01419 Encounter for gynecological examination (general) (routine) without abnormal findings: Secondary | ICD-10-CM

## 2019-05-27 DIAGNOSIS — Z0000 Encounter for general adult medical examination without abnormal findings: Secondary | ICD-10-CM

## 2019-05-27 NOTE — Progress Notes (Signed)
HPI:      [name] is a 48 y.o. [phone] who LMP was No LMP recorded. (Menstrual status: IUD).  Subjective:   She presents today for her annual examination.  She has no complaints.  She is not having any bleeding.  She is happy with her IUD.  She sees her PCP for lab work and routine medical care including prescriptions.    Hx: The following portions of the patient's history were reviewed and updated as appropriate:             She  has a past medical history of Anemia, Anxiety, Aortic regurgitation, Back pain, CHF (congestive heart failure) ([place]), COPD (chronic obstructive pulmonary disease) ([place]), Depression, Dyspnea, Gall stones, GERD (gastroesophageal reflux disease), Headache, History of kidney stones, Hypertension, Hypothyroidism, Kidney atrophy, Oxygen deficit, Pneumonia, Thyroid disease, and Vertigo. She does not have any pertinent problems on file. She  has a past surgical history that includes Cholecystectomy; Cholecystectomy (N/A, 02/28/2018); and LEEP (N/A, 02/16/2019). Her family history includes Cancer in her father and paternal grandmother; Diabetes in an other family member; Heart failure in an other family member; Hypertension in her maternal grandfather; Stroke in her maternal grandfather; Thyroid disease in her mother. She  reports that she has been smoking cigarettes. She has a 20.00 pack-year smoking history. She has never used smokeless tobacco. She reports that she does not drink alcohol or use drugs. She has a current medication list which includes the following prescription(s): albuterol, budesonide-formoterol, bupropion, carvedilol, cholecalciferol, clonazepam, ezetimibe, famotidine, ferrous sulfate, furosemide, levocetirizine, levothyroxine, losartan, meclizine, oxygen-helium, potassium chloride sa, sertraline, simvastatin, and sucralfate. She is allergic to lactose intolerance (gi).       Review of Systems:  Review of Systems  Constitutional: Denied  constitutional symptoms, night sweats, recent illness, fatigue, fever, insomnia and weight loss.  Eyes: Denied eye symptoms, eye pain, photophobia, vision change and visual disturbance.  Ears/Nose/Throat/Neck: Denied ear, nose, throat or neck symptoms, hearing loss, nasal discharge, sinus congestion and sore throat.  Cardiovascular: Denied cardiovascular symptoms, arrhythmia, chest pain/pressure, edema, exercise intolerance, orthopnea and palpitations.  Respiratory: Denied pulmonary symptoms, asthma, pleuritic pain, productive sputum, cough, dyspnea and wheezing.  Gastrointestinal: Denied, gastro-esophageal reflux, melena, nausea and vomiting.  Genitourinary: Denied genitourinary symptoms including symptomatic vaginal discharge, pelvic relaxation issues, and urinary complaints.  Musculoskeletal: Denied musculoskeletal symptoms, stiffness, swelling, muscle weakness and myalgia.  Dermatologic: Denied dermatology symptoms, rash and scar.  Neurologic: Denied neurology symptoms, dizziness, headache, neck pain and syncope.  Psychiatric: Denied psychiatric symptoms, anxiety and depression.  Endocrine: Denied endocrine symptoms including hot flashes and night sweats.   Meds:   Current Outpatient Medications on File Prior to Visit  Medication Sig Dispense Refill  . albuterol (PROVENTIL HFA;VENTOLIN HFA) 108 (90 BASE) MCG/ACT inhaler Inhale 2 puffs into the lungs every 4 (four) hours as needed for wheezing or shortness of breath.    . budesonide-formoterol (SYMBICORT) 160-4.5 MCG/ACT inhaler Inhale 2 puffs into the lungs 2 (two) times daily as needed (shortness of breath).     Marland Kitchen buPROPion (WELLBUTRIN XL) 300 MG 24 hr tablet Take 300 mg by mouth every morning.   2  . carvedilol (COREG) 6.25 MG tablet Take 6.25 mg by mouth 2 (two) times daily with a meal.    . cholecalciferol (VITAMIN D3) 25 MCG (1000 UT) tablet Take 1,000 Units by mouth daily.    . clonazePAM (KLONOPIN) 1 MG tablet Take 1 mg by mouth  daily. And prn-typically takes around lunch    .  ezetimibe (ZETIA) 10 MG tablet Take 10 mg by mouth at bedtime.   2  . famotidine (PEPCID) 20 MG tablet Take 20 mg by mouth 2 (two) times daily.    . ferrous sulfate 325 (65 FE) MG tablet Take 325 mg by mouth daily with breakfast.    . furosemide (LASIX) 20 MG tablet Take 80 mg by mouth every morning.     Marland Kitchen levocetirizine (XYZAL) 5 MG tablet Take 5 mg by mouth every evening.    Marland Kitchen levothyroxine (SYNTHROID) 50 MCG tablet Take 50 mcg by mouth daily before breakfast.     . losartan (COZAAR) 25 MG tablet Take 25 mg by mouth every morning.     . meclizine (ANTIVERT) 25 MG tablet Take 25 mg by mouth 2 (two) times daily.   2  . OXYGEN Inhale 2 L into the lungs at bedtime.    . potassium chloride SA (K-DUR,KLOR-CON) 20 MEQ tablet Take 20 mEq by mouth daily.     . sertraline (ZOLOFT) 50 MG tablet     . simvastatin (ZOCOR) 40 MG tablet Take 40 mg by mouth at bedtime.     . sucralfate (CARAFATE) 1 g tablet Take 1 tablet (1 g total) by mouth 4 (four) times daily. (Patient taking differently: Take 1 g by mouth 2 (two) times daily. But can take up to qid) 60 tablet 0   No current facility-administered medications on file prior to visit.     Objective:     Vitals:   05/27/19 1102  BP: 112/77  Pulse: 94              Physical examination General NAD, Conversant  HEENT Atraumatic; Op clear with mmm.  Normo-cephalic. Pupils reactive. Anicteric sclerae  Thyroid/Neck Smooth without nodularity or enlargement. Normal ROM.  Neck Supple.  Skin No rashes, lesions or ulceration. Normal palpated skin turgor. No nodularity.  Breasts: No masses or discharge.  Symmetric.  No axillary adenopathy.  Lungs: Clear to auscultation.No rales or wheezes. Normal Respiratory effort, no retractions.  Heart: NSR.  No murmurs or rubs appreciated. No periferal edema  Abdomen: Soft.  Non-tender.  No masses.  No HSM. No hernia  Extremities: Moves all appropriately.  Normal ROM for  age. No lymphadenopathy.  Neuro: Oriented to PPT.  Normal mood. Normal affect.     Pelvic:   Vulva: Normal appearance.  No lesions.  Vagina: No lesions or abnormalities noted.  Support: Normal pelvic support.  Urethra No masses tenderness or scarring.  Meatus Normal size without lesions or prolapse.  Cervix: Normal appearance.  No lesions.  Obviously status post procedure.  IUD strings noted.  Anus: Normal exam.  No lesions.  Perineum: Normal exam.  No lesions.        Bimanual   Uterus: Normal size.  Non-tender.  Mobile.  AV.  Adnexae: No masses.  Non-tender to palpation.  Cul-de-sac: Negative for abnormality.      Assessment:    [id number] Patient Active Problem List   Diagnosis Date Noted  . S/P laparoscopic cholecystectomy 02/28/2018  . Gallstones 02/28/2018  . Malnutrition of moderate degree 06/30/2015  . RUQ abdominal pain   . Acute cholecystitis 06/28/2015  . Allergic reaction 05/26/2015     1. Well woman exam with routine gynecological exam     Patient doing well without concerns at this time.  Patient has a recent history of LEEP and doing well from that.   Plan:  1.  Basic Screening Recommendations The basic screening recommendations for asymptomatic women were discussed with the patient during her visit.  The age-appropriate recommendations were discussed with her and the rational for the tests reviewed.  When I am informed by the patient that another primary care physician has previously obtained the age-appropriate tests and they are up-to-date, only outstanding tests are ordered and referrals given as necessary.  Abnormal results of tests will be discussed with her when all of her results are completed.  Routine preventative health maintenance measures emphasized: Exercise/Diet/Weight control, Tobacco Warnings, Alcohol/Substance use risks and Stress Management 2.  Recommend follow-up from LEEP in March with first Pap smear.  I have discussed this with  her in detail.  Orders No orders of the defined types were placed in this encounter.   No orders of the defined types were placed in this encounter.       F/U  Return in about 4 months (around 09/24/2019).  Finis Bud, M.D. 05/27/2019 11:31 AM

## 2019-09-24 ENCOUNTER — Other Ambulatory Visit: Payer: Self-pay

## 2019-09-24 ENCOUNTER — Encounter: Payer: Self-pay | Admitting: Obstetrics and Gynecology

## 2019-09-24 ENCOUNTER — Other Ambulatory Visit (HOSPITAL_COMMUNITY)
Admission: RE | Admit: 2019-09-24 | Discharge: 2019-09-24 | Disposition: A | Discharge status: Home / Self Care | Payer: Medicaid Other | Source: Ambulatory Visit | Attending: Obstetrics and Gynecology | Admitting: Obstetrics and Gynecology

## 2019-09-24 ENCOUNTER — Ambulatory Visit (INDEPENDENT_AMBULATORY_CARE_PROVIDER_SITE_OTHER): Payer: Medicaid Other | Admitting: Obstetrics and Gynecology

## 2019-09-24 VITALS — BP 119/59 | HR 92 | Ht 65.0 in | Wt 259.1 lb

## 2019-09-24 DIAGNOSIS — N871 Moderate cervical dysplasia: Secondary | ICD-10-CM | POA: Diagnosis not present

## 2019-09-24 NOTE — Progress Notes (Signed)
HPI:      [name] is a 49 y.o. [phone] who LMP was No LMP recorded. (Menstrual status: IUD).  Subjective:   She presents today for follow-up after LEEP.  This is her first Pap after LEEP.  She also has an IUD in place for bleeding and cramping.  She reports that some of her cramping has returned but she has minimal bleeding.    Hx: The following portions of the patient's history were reviewed and updated as appropriate:             She  has a past medical history of Anemia, Anxiety, Aortic regurgitation, Back pain, CHF (congestive heart failure) ([place]), COPD (chronic obstructive pulmonary disease) ([place]), Depression, Dyspnea, Gall stones, GERD (gastroesophageal reflux disease), Headache, History of kidney stones, Hypertension, Hypothyroidism, Kidney atrophy, Oxygen deficit, Pneumonia, Thyroid disease, and Vertigo. She does not have any pertinent problems on file. She  has a past surgical history that includes Cholecystectomy; Cholecystectomy (N/A, 02/28/2018); and LEEP (N/A, 02/16/2019). Her family history includes Cancer in her father and paternal grandmother; Diabetes in an other family member; Heart failure in an other family member; Hypertension in her maternal grandfather; Stroke in her maternal grandfather; Thyroid disease in her mother. She  reports that she has been smoking cigarettes. She has a 20.00 pack-year smoking history. She has never used smokeless tobacco. She reports that she does not drink alcohol or use drugs. She has a current medication list which includes the following prescription(s): albuterol, budesonide-formoterol, bupropion, carvedilol, cholecalciferol, clonazepam, ezetimibe, famotidine, ferrous sulfate, furosemide, levocetirizine, losartan, meclizine, oxygen-helium, potassium chloride sa, simvastatin, sucralfate, levothyroxine, and sertraline. She is allergic to lactose intolerance (gi).       Review of Systems:  Review of Systems  Constitutional: Denied  constitutional symptoms, night sweats, recent illness, fatigue, fever, insomnia and weight loss.  Eyes: Denied eye symptoms, eye pain, photophobia, vision change and visual disturbance.  Ears/Nose/Throat/Neck: Denied ear, nose, throat or neck symptoms, hearing loss, nasal discharge, sinus congestion and sore throat.  Cardiovascular: Denied cardiovascular symptoms, arrhythmia, chest pain/pressure, edema, exercise intolerance, orthopnea and palpitations.  Respiratory: Denied pulmonary symptoms, asthma, pleuritic pain, productive sputum, cough, dyspnea and wheezing.  Gastrointestinal: Denied, gastro-esophageal reflux, melena, nausea and vomiting.  Genitourinary: Denied genitourinary symptoms including symptomatic vaginal discharge, pelvic relaxation issues, and urinary complaints.  Musculoskeletal: Denied musculoskeletal symptoms, stiffness, swelling, muscle weakness and myalgia.  Dermatologic: Denied dermatology symptoms, rash and scar.  Neurologic: Denied neurology symptoms, dizziness, headache, neck pain and syncope.  Psychiatric: Denied psychiatric symptoms, anxiety and depression.  Endocrine: Denied endocrine symptoms including hot flashes and night sweats.   Meds:   Current Outpatient Medications on File Prior to Visit  Medication Sig Dispense Refill  . albuterol (PROVENTIL HFA;VENTOLIN HFA) 108 (90 BASE) MCG/ACT inhaler Inhale 2 puffs into the lungs every 4 (four) hours as needed for wheezing or shortness of breath.    . budesonide-formoterol (SYMBICORT) 160-4.5 MCG/ACT inhaler Inhale 2 puffs into the lungs 2 (two) times daily as needed (shortness of breath).     Marland Kitchen buPROPion (WELLBUTRIN XL) 300 MG 24 hr tablet Take 300 mg by mouth every morning.   2  . carvedilol (COREG) 6.25 MG tablet Take 6.25 mg by mouth 2 (two) times daily with a meal.    . cholecalciferol (VITAMIN D3) 25 MCG (1000 UT) tablet Take 1,000 Units by mouth daily.    . clonazePAM (KLONOPIN) 1 MG tablet Take 1 mg by mouth  daily. And prn-typically takes around lunch    .  ezetimibe (ZETIA) 10 MG tablet Take 10 mg by mouth at bedtime.   2  . famotidine (PEPCID) 20 MG tablet Take 20 mg by mouth 2 (two) times daily.    . ferrous sulfate 325 (65 FE) MG tablet Take 325 mg by mouth daily with breakfast.    . furosemide (LASIX) 20 MG tablet Take 80 mg by mouth every morning.     Marland Kitchen levocetirizine (XYZAL) 5 MG tablet Take 5 mg by mouth every evening.    Marland Kitchen losartan (COZAAR) 25 MG tablet Take 25 mg by mouth every morning.     . meclizine (ANTIVERT) 25 MG tablet Take 25 mg by mouth 2 (two) times daily.   2  . OXYGEN Inhale 2 L into the lungs at bedtime.    . potassium chloride SA (K-DUR,KLOR-CON) 20 MEQ tablet Take 20 mEq by mouth daily.     . simvastatin (ZOCOR) 40 MG tablet Take 40 mg by mouth at bedtime.     . sucralfate (CARAFATE) 1 g tablet Take 1 tablet (1 g total) by mouth 4 (four) times daily. (Patient taking differently: Take 1 g by mouth 2 (two) times daily. But can take up to qid) 60 tablet 0  . levothyroxine (SYNTHROID) 50 MCG tablet Take 50 mcg by mouth daily before breakfast.     . sertraline (ZOLOFT) 50 MG tablet      No current facility-administered medications on file prior to visit.    Objective:     Vitals:   09/24/19 1013  BP: (!) 119/59  Pulse: 92              Physical examination   Pelvic:   Vulva: Normal appearance.  No lesions.  Vagina: No lesions or abnormalities noted.  Support: Normal pelvic support.  Urethra No masses tenderness or scarring.  Meatus Normal size without lesions or prolapse.  Cervix: Normal appearance.  No lesions. IUD strings noted at cervical os.  Anus: Normal exam.  No lesions.  Perineum: Normal exam.  No lesions.        Bimanual   Uterus: Normal size.  Non-tender.  Mobile.  AV.  Adnexae: No masses.  Non-tender to palpation.  Cul-de-sac: Negative for abnormality.      Assessment:    [id number] Patient Active Problem List   Diagnosis Date Noted  . S/P  laparoscopic cholecystectomy 02/28/2018  . Gallstones 02/28/2018  . Malnutrition of moderate degree 06/30/2015  . RUQ abdominal pain   . Acute cholecystitis 06/28/2015  . Allergic reaction 05/26/2015     1. Dysplasia of cervix, high grade CIN 2        Plan:            1.  First Pap after LEEP  2.  Still doing well with IUD Orders No orders of the defined types were placed in this encounter.   No orders of the defined types were placed in this encounter.     F/U  Return for We will contact her with any abnormal test results. I spent 22 minutes involved in the care of this patient preparing to see the patient by obtaining and reviewing her medical history (including labs, imaging tests and prior procedures), documenting clinical information in the electronic health record (EHR), counseling and coordinating care plans, writing and sending prescriptions, ordering tests or procedures and directly communicating with the patient by discussing pertinent items from her history and physical exam as well as detailing my assessment and plan as noted above so  that she has an informed understanding.  All of her questions were answered.  Finis Bud, M.D. 09/24/2019 10:38 AM

## 2019-09-24 NOTE — Addendum Note (Signed)
Addended by: Durwin Glaze on: 09/24/2019 11:40 AM   Modules accepted: Orders

## 2019-09-25 LAB — CYTOLOGY - PAP
Comment: NEGATIVE
Diagnosis: NEGATIVE
High risk HPV: NEGATIVE

## 2019-10-22 ENCOUNTER — Encounter: Payer: Self-pay | Admitting: Surgical

## 2019-11-24 ENCOUNTER — Other Ambulatory Visit: Payer: Self-pay | Admitting: Family Medicine

## 2019-11-24 DIAGNOSIS — Z1231 Encounter for screening mammogram for malignant neoplasm of breast: Secondary | ICD-10-CM

## 2020-03-24 ENCOUNTER — Ambulatory Visit: Payer: Medicaid Other | Admitting: Obstetrics and Gynecology

## 2020-03-31 ENCOUNTER — Ambulatory Visit: Payer: Medicaid Other | Admitting: Obstetrics and Gynecology

## 2020-04-07 ENCOUNTER — Emergency Department: Payer: Medicaid Other

## 2020-04-07 ENCOUNTER — Other Ambulatory Visit: Payer: Self-pay

## 2020-04-07 ENCOUNTER — Emergency Department
Admission: EM | Admit: 2020-04-07 | Discharge: 2020-04-07 | Disposition: A | Discharge status: Home / Self Care | Payer: Medicaid Other | Attending: Emergency Medicine | Admitting: Emergency Medicine

## 2020-04-07 DIAGNOSIS — J449 Chronic obstructive pulmonary disease, unspecified: Secondary | ICD-10-CM | POA: Diagnosis not present

## 2020-04-07 DIAGNOSIS — E039 Hypothyroidism, unspecified: Secondary | ICD-10-CM | POA: Diagnosis not present

## 2020-04-07 DIAGNOSIS — I110 Hypertensive heart disease with heart failure: Secondary | ICD-10-CM | POA: Insufficient documentation

## 2020-04-07 DIAGNOSIS — F17210 Nicotine dependence, cigarettes, uncomplicated: Secondary | ICD-10-CM | POA: Diagnosis not present

## 2020-04-07 DIAGNOSIS — R079 Chest pain, unspecified: Secondary | ICD-10-CM | POA: Diagnosis present

## 2020-04-07 DIAGNOSIS — Z79899 Other long term (current) drug therapy: Secondary | ICD-10-CM | POA: Insufficient documentation

## 2020-04-07 DIAGNOSIS — I509 Heart failure, unspecified: Secondary | ICD-10-CM | POA: Insufficient documentation

## 2020-04-07 LAB — BASIC METABOLIC PANEL
Anion gap: 17 — ABNORMAL HIGH (ref 5–15)
BUN: 31 mg/dL — ABNORMAL HIGH (ref 6–20)
CO2: 21 mmol/L — ABNORMAL LOW (ref 22–32)
Calcium: 9.7 mg/dL (ref 8.9–10.3)
Chloride: 103 mmol/L (ref 98–111)
Creatinine, Ser: 1.8 mg/dL — ABNORMAL HIGH (ref 0.44–1.00)
GFR calc Af Amer: 38 mL/min — ABNORMAL LOW (ref >60)
GFR calc non Af Amer: 33 mL/min — ABNORMAL LOW (ref >60)
Glucose, Bld: 171 mg/dL — ABNORMAL HIGH (ref 70–99)
Potassium: 3.8 mmol/L (ref 3.5–5.1)
Sodium: 141 mmol/L (ref 135–145)

## 2020-04-07 LAB — TROPONIN I (HIGH SENSITIVITY)
Troponin I (High Sensitivity): 8 ng/L (ref <18)
Troponin I (High Sensitivity): 7 ng/L (ref <18)

## 2020-04-07 LAB — CBC
HCT: 34 % — ABNORMAL LOW (ref 36.0–46.0)
Hemoglobin: 11.8 g/dL — ABNORMAL LOW (ref 12.0–15.0)
MCH: 33.3 pg (ref 26.0–34.0)
MCHC: 34.7 g/dL (ref 30.0–36.0)
MCV: 96 fL (ref 80.0–100.0)
Platelets: 337 10*3/uL (ref 150–400)
RBC: 3.54 MIL/uL — ABNORMAL LOW (ref 3.87–5.11)
RDW: 13.1 % (ref 11.5–15.5)
WBC: 10 10*3/uL (ref 4.0–10.5)
nRBC: 0 % (ref 0.0–0.2)

## 2020-04-07 LAB — POCT PREGNANCY, URINE: Preg Test, Ur: NEGATIVE

## 2020-04-07 NOTE — ED Notes (Signed)
See triage note  Presents intermittent chest pain   But states pain started this am  She took one of her brothers' NTG  States that eased the pain   No SOB  States pain is non radiating   Has appt with her cardiologist tomorrow

## 2020-04-07 NOTE — ED Provider Notes (Signed)
[facility] Emergency Department Provider Note  Time seen: 1:48 PM  I have reviewed the triage vital signs and the nursing notes.   HISTORY  Chief Complaint Chest Pain   HPI Grace Fox is a 49 y.o. female with a past medical history of anxiety, anemia, CHF, COPD, gastric reflux, hypertension, hyperlipidemia, angina, presents emergency department for chest pain.  According to the patient over the past few days she has been experiencing intermittent chest pain which last occurred prior to arrival.  Patient states she took 2 of her nitroglycerin tablets without relief so she came to the emergency department.  Patient denies any shortness of breath cough or fever.  Patient states a history of chest pain in the past but denies any prior heart attacks.  Patient also states a history of anxiety in the past as well.  Patient does report a family history of heart disease including her brother.   Past Medical History:  Diagnosis Date  . Anemia   . Anxiety   . Aortic regurgitation    PT STATES SHE NEEDS VALVE REPLACMENT-SEE DR Neoma Laming  . Back pain    SCIATICA  . CHF (congestive heart failure) ([place])   . COPD (chronic obstructive pulmonary disease) ([place])   . Depression   . Dyspnea    DOE  . Gall stones   . GERD (gastroesophageal reflux disease)   . Headache    migraines  . History of kidney stones    h/o  . Hypertension   . Hypothyroidism   . Kidney atrophy    some damage to due to one kidney due to [facility]  . Oxygen deficit    2L/ HS  . Pneumonia   . Thyroid disease   . Vertigo     Patient Active Problem List   Diagnosis Date Noted  . S/P laparoscopic cholecystectomy 02/28/2018  . Gallstones 02/28/2018  . Malnutrition of moderate degree 06/30/2015  . RUQ abdominal pain   . Acute cholecystitis 06/28/2015  . Allergic reaction 05/26/2015    Past Surgical History:  Procedure Laterality Date  . CHOLECYSTECTOMY    . CHOLECYSTECTOMY N/A  02/28/2018   Procedure: LAPAROSCOPIC CHOLECYSTECTOMY WITH INTRAOPERATIVE CHOLANGIOGRAM;  Surgeon: Jovita Kussmaul, MD;  Location: [facility] ORS;  Service: General;  Laterality: N/A;  . LEEP N/A 02/16/2019   Procedure: LOOP ELECTROSURGICAL EXCISION PROCEDURE (LEEP);  Surgeon: Harlin Heys, MD;  Location: [facility] ORS;  Service: Gynecology;  Laterality: N/A;    Prior to Admission medications   Medication Sig Start Date End Date Taking? Authorizing Provider  albuterol (PROVENTIL HFA;VENTOLIN HFA) 108 (90 BASE) MCG/ACT inhaler Inhale 2 puffs into the lungs every 4 (four) hours as needed for wheezing or shortness of breath.    [provider]  budesonide-formoterol (SYMBICORT) 160-4.5 MCG/ACT inhaler Inhale 2 puffs into the lungs 2 (two) times daily as needed (shortness of breath).     [provider]  buPROPion (WELLBUTRIN XL) 300 MG 24 hr tablet Take 300 mg by mouth every morning.  11/14/17   [provider]  carvedilol (COREG) 6.25 MG tablet Take 6.25 mg by mouth 2 (two) times daily with a meal.    [provider]  cholecalciferol (VITAMIN D3) 25 MCG (1000 UT) tablet Take 1,000 Units by mouth daily.    [provider]  clonazePAM (KLONOPIN) 1 MG tablet Take 1 mg by mouth daily. And prn-typically takes around lunch    [provider]  ezetimibe (ZETIA) 10 MG tablet  Take 10 mg by mouth at bedtime.  11/13/17   [provider]  famotidine (PEPCID) 20 MG tablet Take 20 mg by mouth 2 (two) times daily.    [provider]  ferrous sulfate 325 (65 FE) MG tablet Take 325 mg by mouth daily with breakfast.    [provider]  furosemide (LASIX) 20 MG tablet Take 80 mg by mouth every morning.     [provider]  levocetirizine (XYZAL) 5 MG tablet Take 5 mg by mouth every evening.    [provider]  levothyroxine (SYNTHROID) 50 MCG tablet Take 50 mcg by mouth daily before breakfast.     [provider]  losartan  (COZAAR) 25 MG tablet Take 25 mg by mouth every morning.     [provider]  meclizine (ANTIVERT) 25 MG tablet Take 25 mg by mouth 2 (two) times daily.  11/09/17   [provider]  OXYGEN Inhale 2 L into the lungs at bedtime.    [provider]  potassium chloride SA (K-DUR,KLOR-CON) 20 MEQ tablet Take 20 mEq by mouth daily.     [provider]  sertraline (ZOLOFT) 50 MG tablet  03/12/19   [provider]  simvastatin (ZOCOR) 40 MG tablet Take 40 mg by mouth at bedtime.     [provider]  sucralfate (CARAFATE) 1 g tablet Take 1 tablet (1 g total) by mouth 4 (four) times daily. Patient taking differently: Take 1 g by mouth 2 (two) times daily. But can take up to qid 05/30/18   Nance Pear, MD    Allergies  Allergen Reactions  . Lactose Intolerance (Gi)     Family History  Problem Relation Age of Onset  . Diabetes Other   . Heart failure Other   . Thyroid disease Mother   . Cancer Father   . Hypertension Maternal Grandfather   . Stroke Maternal Grandfather   . Cancer Paternal Grandmother   . Breast cancer Neg Hx     Social History Social History   Tobacco Use  . Smoking status: Current Every Day Smoker    Packs/day: 1.00    Years: 20.00    Pack years: 20.00    Types: Cigarettes  . Smokeless tobacco: Never Used  Vaping Use  . Vaping Use: Never used  Substance Use Topics  . Alcohol use: No  . Drug use: No    Review of Systems Constitutional: Negative for fever. Cardiovascular: Intermittent moderate dull chest pains over the past few days, most recently just prior to arrival, states minimal to no chest pain currently. Respiratory: Negative for shortness of breath. Gastrointestinal: Negative for abdominal pain, vomiting  Musculoskeletal: Negative for musculoskeletal complaints Neurological: Negative for headache All other ROS negative  ____________________________________________   PHYSICAL EXAM:  VITAL  SIGNS: ED Triage Vitals  Enc Vitals Group     BP 04/07/20 1129 95/65     Pulse Rate 04/07/20 1129 91     Resp 04/07/20 1129 18     Temp 04/07/20 1129 98.4 F (36.9 C)     Temp Source 04/07/20 1129 Oral     SpO2 04/07/20 1129 97 %     Weight 04/07/20 1137 245 lb (111.1 kg)     Height 04/07/20 1137 5\' 5"  (1.651 m)     Head Circumference --      Peak Flow --      Pain Score 04/07/20 1136 7     Pain Loc --  Pain Edu? --      Excl. in [place]? --    Constitutional: Alert and oriented. Well appearing and in no distress. Eyes: Normal exam ENT      Head: Normocephalic and atraumatic.      Mouth/Throat: Mucous membranes are moist. Cardiovascular: Normal rate, regular rhythm.  Respiratory: Normal respiratory effort without tachypnea nor retractions. Breath sounds are clear Gastrointestinal: Soft and nontender. No distention.   Musculoskeletal: Nontender with normal range of motion in all extremities.  Neurologic:  Normal speech and language. No gross focal neurologic deficits  Skin:  Skin is warm, dry and intact.  Psychiatric: Mood and affect are normal.   ____________________________________________    EKG  EKG viewed and interpreted by myself shows a normal sinus rhythm at 90 bpm with a narrow QRS, normal axis, normal intervals, nonspecific ST changes.  ____________________________________________    RADIOLOGY  Chest x-ray is clear  ____________________________________________   INITIAL IMPRESSION / ASSESSMENT AND PLAN / ED COURSE  Pertinent labs & imaging results that were available during my care of the patient were reviewed by me and considered in my medical decision making (see chart for details).   Patient presents to the emergency department for intermittent chest pain over the past few days.  Patient states a history of chest pain previously, unrelieved by nitroglycerin although states currently minimal to no chest pain.  Patient has an appointment with her  cardiologist Dr. Humphrey Rolls tomorrow.  Patient's labs appear to show some degree of dehydration but reassuringly negative troponin.  Chronic kidney disease largely unchanged.  Patient states she is feeling much better currently however given the chest pain just prior to arrival we will repeat a troponin as a precaution.  At the patient's repeat troponin remains negative I believe the patient would be safe for discharge home with significant oral hydration at home and cardiology follow-up tomorrow as scheduled.  Patient's repeat troponin remains negative.  We will discharge patient home with cardiology follow-up tomorrow.  Patient agreeable to plan of care.  ZANOBIA GRIEBEL was evaluated in Emergency Department on 04/07/2020 for the symptoms described in the history of present illness. She was evaluated in the context of the global COVID-19 pandemic, which necessitated consideration that the patient might be at risk for infection with the SARS-CoV-2 virus that causes COVID-19. Institutional protocols and algorithms that pertain to the evaluation of patients at risk for COVID-19 are in a state of rapid change based on information released by regulatory bodies including the CDC and federal and state organizations. These policies and algorithms were followed during the patient's care in the ED.  ____________________________________________   FINAL CLINICAL IMPRESSION(S) / ED DIAGNOSES  Chest pain   Harvest Dark, MD 04/07/20 1459

## 2020-04-07 NOTE — ED Triage Notes (Signed)
Pt here via POV for CP that started about an hour ago. Pt took meds at home with no relief. Hx of mitral valve regurgitation.

## 2020-04-08 ENCOUNTER — Ambulatory Visit: Payer: Self-pay | Admitting: Cardiovascular Disease

## 2020-04-08 DIAGNOSIS — I200 Unstable angina: Secondary | ICD-10-CM | POA: Insufficient documentation

## 2020-04-08 MED ORDER — SODIUM CHLORIDE 0.9% FLUSH
3.0000 mL | Freq: Two times a day (BID) | INTRAVENOUS | Status: AC
  Filled 2020-04-08: qty 3

## 2020-04-11 ENCOUNTER — Other Ambulatory Visit (HOSPITAL_COMMUNITY)
Admission: RE | Admit: 2020-04-11 | Discharge: 2020-04-11 | Disposition: A | Discharge status: Home / Self Care | Payer: Medicaid Other | Source: Ambulatory Visit | Attending: Obstetrics and Gynecology | Admitting: Obstetrics and Gynecology

## 2020-04-11 ENCOUNTER — Ambulatory Visit (INDEPENDENT_AMBULATORY_CARE_PROVIDER_SITE_OTHER): Payer: Medicaid Other | Admitting: Obstetrics and Gynecology

## 2020-04-11 ENCOUNTER — Other Ambulatory Visit: Payer: Self-pay

## 2020-04-11 ENCOUNTER — Encounter: Payer: Self-pay | Admitting: Obstetrics and Gynecology

## 2020-04-11 VITALS — BP 95/58 | HR 101 | Ht 65.0 in | Wt 245.8 lb

## 2020-04-11 DIAGNOSIS — N871 Moderate cervical dysplasia: Secondary | ICD-10-CM

## 2020-04-11 NOTE — Progress Notes (Signed)
HPI:      [name] is a 49 y.o. [phone] who LMP was No LMP recorded. (Menstrual status: IUD).  Subjective:   She presents today for follow-up of her Pap smear.  She had CIN-2 and underwent LEEP.  Her first Pap after LEEP showed no cytologic abnormalities. She has an IUD in place reports no problems with her IUD.  She denies bleeding. Of significant note, patient was seen in the emergency department last week with complaint of chest pain.  She is scheduled for heart cath in the next few days.    Hx: The following portions of the patient's history were reviewed and updated as appropriate:             She  has a past medical history of Anemia, Anxiety, Aortic regurgitation, Back pain, CHF (congestive heart failure) ([place]), COPD (chronic obstructive pulmonary disease) ([place]), Depression, Dyspnea, Gall stones, GERD (gastroesophageal reflux disease), Headache, History of kidney stones, Hypertension, Hypothyroidism, Kidney atrophy, Oxygen deficit, Pneumonia, Thyroid disease, and Vertigo. She does not have any pertinent problems on file. She  has a past surgical history that includes Cholecystectomy; Cholecystectomy (N/A, 02/28/2018); and LEEP (N/A, 02/16/2019). Her family history includes Cancer in her father and paternal grandmother; Diabetes in an other family member; Heart failure in an other family member; Hypertension in her maternal grandfather; Stroke in her maternal grandfather; Thyroid disease in her mother. She  reports that she has been smoking cigarettes. She has a 20.00 pack-year smoking history. She has never used smokeless tobacco. She reports that she does not drink alcohol and does not use drugs. She has a current medication list which includes the following prescription(s): albuterol, alprazolam, budesonide-formoterol, bupropion, carvedilol, cholecalciferol, ezetimibe, famotidine, ferrous sulfate, folic acid, furosemide, levocetirizine, levothyroxine, losartan, meclizine,  oxygen-helium, potassium chloride sa, sertraline, simvastatin, and sucralfate, and the following Facility-Administered Medications: sodium chloride flush. She is allergic to lactose intolerance (gi).       Review of Systems:  Review of Systems  Constitutional: Denied constitutional symptoms, night sweats, recent illness, fatigue, fever, insomnia and weight loss.  Eyes: Denied eye symptoms, eye pain, photophobia, vision change and visual disturbance.  Ears/Nose/Throat/Neck: Denied ear, nose, throat or neck symptoms, hearing loss, nasal discharge, sinus congestion and sore throat.  Cardiovascular: Denied cardiovascular symptoms, arrhythmia, chest pain/pressure, edema, exercise intolerance, orthopnea and palpitations.  Respiratory: Denied pulmonary symptoms, asthma, pleuritic pain, productive sputum, cough, dyspnea and wheezing.  Gastrointestinal: Denied, gastro-esophageal reflux, melena, nausea and vomiting.  Genitourinary: Denied genitourinary symptoms including symptomatic vaginal discharge, pelvic relaxation issues, and urinary complaints.  Musculoskeletal: Denied musculoskeletal symptoms, stiffness, swelling, muscle weakness and myalgia.  Dermatologic: Denied dermatology symptoms, rash and scar.  Neurologic: Denied neurology symptoms, dizziness, headache, neck pain and syncope.  Psychiatric: Denied psychiatric symptoms, anxiety and depression.  Endocrine: Denied endocrine symptoms including hot flashes and night sweats.   Meds:   Current Outpatient Medications on File Prior to Visit  Medication Sig Dispense Refill   albuterol (PROVENTIL HFA;VENTOLIN HFA) 108 (90 BASE) MCG/ACT inhaler Inhale 2 puffs into the lungs every 4 (four) hours as needed for wheezing or shortness of breath.     ALPRAZolam (XANAX) 1 MG tablet      budesonide-formoterol (SYMBICORT) 160-4.5 MCG/ACT inhaler Inhale 2 puffs into the lungs 2 (two) times daily as needed (shortness of breath).      buPROPion (WELLBUTRIN  XL) 300 MG 24 hr tablet Take 300 mg by mouth every morning.   2   carvedilol (COREG) 6.25 MG  tablet Take 6.25 mg by mouth 2 (two) times daily with a meal.     cholecalciferol (VITAMIN D3) 25 MCG (1000 UT) tablet Take 1,000 Units by mouth daily.     ezetimibe (ZETIA) 10 MG tablet Take 10 mg by mouth at bedtime.   2   famotidine (PEPCID) 20 MG tablet Take 20 mg by mouth 2 (two) times daily.     ferrous sulfate 325 (65 FE) MG tablet Take 325 mg by mouth daily with breakfast.     folic acid (FOLVITE) 1 MG tablet Take 1 mg by mouth daily.     furosemide (LASIX) 20 MG tablet Take 80 mg by mouth every morning.      levocetirizine (XYZAL) 5 MG tablet Take 5 mg by mouth every evening.     levothyroxine (SYNTHROID) 50 MCG tablet Take 50 mcg by mouth daily before breakfast.      losartan (COZAAR) 25 MG tablet Take 25 mg by mouth every morning.      meclizine (ANTIVERT) 25 MG tablet Take 25 mg by mouth 2 (two) times daily.   2   OXYGEN Inhale 2 L into the lungs at bedtime.     potassium chloride SA (K-DUR,KLOR-CON) 20 MEQ tablet Take 20 mEq by mouth daily.      sertraline (ZOLOFT) 50 MG tablet      simvastatin (ZOCOR) 40 MG tablet Take 40 mg by mouth at bedtime.      sucralfate (CARAFATE) 1 g tablet Take 1 tablet (1 g total) by mouth 4 (four) times daily. (Patient taking differently: Take 1 g by mouth 2 (two) times daily. But can take up to qid) 60 tablet 0   Current Facility-Administered Medications on File Prior to Visit  Medication Dose Route Frequency Provider Last Rate Last Admin   sodium chloride flush (NS) 0.9 % injection 3 mL  3 mL Intravenous Q12H Dionisio Shawonda Kerce, MD        Objective:     Vitals:   04/11/20 0933  BP: (!) 95/58  Pulse: (!) 101   Filed Weights   04/11/20 0933  Weight: 245 lb 12.8 oz (111.5 kg)              Physical examination   Pelvic:   Vulva: Normal appearance.  No lesions.  Vagina: No lesions or abnormalities noted.  Support: Normal pelvic  support.  Urethra No masses tenderness or scarring.  Meatus Normal size without lesions or prolapse.  Cervix: Normal appearance.  No lesions.  IUD strings noted-longer than usual.    Anus: Normal exam.  No lesions.  Perineum: Normal exam.  No lesions.        Bimanual   Uterus: Normal size.  Non-tender.  Mobile.  AV.  Adnexae: No masses.  Non-tender to palpation.  Cul-de-sac: Negative for abnormality.     Assessment:    [id number] Patient Active Problem List   Diagnosis Date Noted   Unstable angina ([place]) 04/08/2020   S/P laparoscopic cholecystectomy 02/28/2018   Gallstones 02/28/2018   Malnutrition of moderate degree 06/30/2015   RUQ abdominal pain    Acute cholecystitis 06/28/2015   Allergic reaction 05/26/2015     1. CIN II (cervical intraepithelial neoplasia II)        Plan:            1.  Pap cotest and viral typing performed.  Patient to follow-up for annual examination unless significant abnormalities are noted on Pap. Orders No orders of the defined types  were placed in this encounter.   No orders of the defined types were placed in this encounter.     F/U  Return for Annual Physical. I spent 21 minutes involved in the care of this patient preparing to see the patient by obtaining and reviewing her medical history (including labs, imaging tests and prior procedures), documenting clinical information in the electronic health record (EHR), counseling and coordinating care plans, writing and sending prescriptions, ordering tests or procedures and directly communicating with the patient by discussing pertinent items from her history and physical exam as well as detailing my assessment and plan as noted above so that she has an informed understanding.  All of her questions were answered.  Finis Bud, M.D. 04/11/2020 10:07 AM

## 2020-04-11 NOTE — Addendum Note (Signed)
Addended by: Durwin Glaze on: 04/11/2020 10:47 AM   Modules accepted: Orders

## 2020-04-12 ENCOUNTER — Other Ambulatory Visit
Admission: RE | Admit: 2020-04-12 | Discharge: 2020-04-12 | Disposition: A | Discharge status: Home / Self Care | Payer: Medicaid Other | Source: Ambulatory Visit | Attending: Cardiovascular Disease | Admitting: Cardiovascular Disease

## 2020-04-12 DIAGNOSIS — Z01812 Encounter for preprocedural laboratory examination: Secondary | ICD-10-CM | POA: Insufficient documentation

## 2020-04-12 DIAGNOSIS — Z20822 Contact with and (suspected) exposure to covid-19: Secondary | ICD-10-CM | POA: Insufficient documentation

## 2020-04-12 LAB — SARS CORONAVIRUS 2 (TAT 6-24 HRS): SARS Coronavirus 2: NEGATIVE

## 2020-04-13 LAB — CYTOLOGY - PAP
Comment: NEGATIVE
Diagnosis: UNDETERMINED — AB
High risk HPV: NEGATIVE

## 2020-04-14 ENCOUNTER — Ambulatory Visit
Admission: RE | Admit: 2020-04-14 | Discharge: 2020-04-14 | Disposition: A | Discharge status: Home / Self Care | Payer: Medicaid Other | Attending: Cardiovascular Disease | Admitting: Cardiovascular Disease

## 2020-04-14 ENCOUNTER — Encounter: Payer: Self-pay | Admitting: Cardiovascular Disease

## 2020-04-14 ENCOUNTER — Encounter
Admission: RE | Disposition: A | Discharge status: Home / Self Care | Payer: Self-pay | Source: Home / Self Care | Attending: Cardiovascular Disease

## 2020-04-14 DIAGNOSIS — I2510 Atherosclerotic heart disease of native coronary artery without angina pectoris: Secondary | ICD-10-CM | POA: Insufficient documentation

## 2020-04-14 DIAGNOSIS — Z79890 Hormone replacement therapy (postmenopausal): Secondary | ICD-10-CM | POA: Diagnosis not present

## 2020-04-14 DIAGNOSIS — I509 Heart failure, unspecified: Secondary | ICD-10-CM | POA: Insufficient documentation

## 2020-04-14 DIAGNOSIS — I080 Rheumatic disorders of both mitral and aortic valves: Secondary | ICD-10-CM | POA: Insufficient documentation

## 2020-04-14 DIAGNOSIS — F17210 Nicotine dependence, cigarettes, uncomplicated: Secondary | ICD-10-CM | POA: Insufficient documentation

## 2020-04-14 DIAGNOSIS — Z79899 Other long term (current) drug therapy: Secondary | ICD-10-CM | POA: Insufficient documentation

## 2020-04-14 DIAGNOSIS — R0600 Dyspnea, unspecified: Secondary | ICD-10-CM | POA: Insufficient documentation

## 2020-04-14 DIAGNOSIS — N184 Chronic kidney disease, stage 4 (severe): Secondary | ICD-10-CM | POA: Diagnosis not present

## 2020-04-14 DIAGNOSIS — I428 Other cardiomyopathies: Secondary | ICD-10-CM | POA: Diagnosis not present

## 2020-04-14 DIAGNOSIS — I200 Unstable angina: Secondary | ICD-10-CM

## 2020-04-14 DIAGNOSIS — I129 Hypertensive chronic kidney disease with stage 1 through stage 4 chronic kidney disease, or unspecified chronic kidney disease: Secondary | ICD-10-CM | POA: Diagnosis not present

## 2020-04-14 DIAGNOSIS — R079 Chest pain, unspecified: Secondary | ICD-10-CM | POA: Insufficient documentation

## 2020-04-14 HISTORY — PX: LEFT HEART CATH AND CORONARY ANGIOGRAPHY: CATH118249

## 2020-04-14 HISTORY — DX: Nausea with vomiting, unspecified: R11.2

## 2020-04-14 HISTORY — DX: Other specified postprocedural states: Z98.890

## 2020-04-14 SURGERY — LEFT HEART CATH AND CORONARY ANGIOGRAPHY
Anesthesia: Moderate Sedation | Laterality: Left

## 2020-04-14 MED ORDER — SODIUM CHLORIDE 0.9 % IV SOLN: 250.0000 mL | INTRAVENOUS | Status: DC | PRN

## 2020-04-14 MED ORDER — ONDANSETRON HCL 4 MG/2ML IJ SOLN: 4.0000 mg | Freq: Four times a day (QID) | INTRAMUSCULAR | Status: DC | PRN

## 2020-04-14 MED ORDER — SODIUM CHLORIDE 0.9% FLUSH: 3.0000 mL | INTRAVENOUS | Status: DC | PRN

## 2020-04-14 MED ORDER — LABETALOL HCL 5 MG/ML IV SOLN: 10.0000 mg | INTRAVENOUS | Status: DC | PRN

## 2020-04-14 MED ORDER — MIDAZOLAM HCL 2 MG/2ML IJ SOLN
INTRAMUSCULAR | Status: DC | PRN
  Administered 2020-04-14: 1 mg via INTRAVENOUS

## 2020-04-14 MED ORDER — ASPIRIN 81 MG PO CHEW: 81.0000 mg | CHEWABLE_TABLET | ORAL | Status: AC

## 2020-04-14 MED ORDER — HEPARIN (PORCINE) IN NACL 1000-0.9 UT/500ML-% IV SOLN
INTRAVENOUS | Status: DC | PRN
  Administered 2020-04-14: 500 mL

## 2020-04-14 MED ORDER — ASPIRIN 81 MG PO CHEW
CHEWABLE_TABLET | ORAL | Status: AC
  Administered 2020-04-14: 81 mg via ORAL
  Filled 2020-04-14: qty 1

## 2020-04-14 MED ORDER — HYDRALAZINE HCL 20 MG/ML IJ SOLN: 10.0000 mg | INTRAMUSCULAR | Status: DC | PRN

## 2020-04-14 MED ORDER — SODIUM CHLORIDE 0.9 % WEIGHT BASED INFUSION: 1.0000 mL/kg/h | INTRAVENOUS | Status: DC

## 2020-04-14 MED ORDER — SODIUM CHLORIDE 0.9 % IV SOLN
INTRAVENOUS | Status: AC | PRN
  Administered 2020-04-14: 500 mL via INTRAVENOUS

## 2020-04-14 MED ORDER — IOHEXOL 300 MG/ML  SOLN
INTRAMUSCULAR | Status: DC | PRN
  Administered 2020-04-14: 30 mL

## 2020-04-14 MED ORDER — FENTANYL CITRATE (PF) 100 MCG/2ML IJ SOLN
INTRAMUSCULAR | Status: DC | PRN
Start: 2020-04-14 — End: 2020-04-14
  Administered 2020-04-14: 25 ug via INTRAVENOUS

## 2020-04-14 MED ORDER — FENTANYL CITRATE (PF) 100 MCG/2ML IJ SOLN
INTRAMUSCULAR | Status: AC
  Filled 2020-04-14: qty 2

## 2020-04-14 MED ORDER — ACETAMINOPHEN 325 MG PO TABS: 650.0000 mg | ORAL_TABLET | ORAL | Status: DC | PRN

## 2020-04-14 MED ORDER — MIDAZOLAM HCL 2 MG/2ML IJ SOLN
INTRAMUSCULAR | Status: AC
  Filled 2020-04-14: qty 2

## 2020-04-14 MED ORDER — SODIUM CHLORIDE 0.9% FLUSH: 3.0000 mL | Freq: Two times a day (BID) | INTRAVENOUS | Status: DC

## 2020-04-14 MED ORDER — SODIUM CHLORIDE 0.9 % WEIGHT BASED INFUSION
3.0000 mL/kg/h | INTRAVENOUS | Status: DC
  Administered 2020-04-14: 3 mL/kg/h via INTRAVENOUS

## 2020-04-14 SURGICAL SUPPLY — 9 items
CATH INFINITI 5FR ANG PIGTAIL (CATHETERS) ×3 IMPLANT
CATH INFINITI 5FR JL4 (CATHETERS) ×3 IMPLANT
CATH INFINITI JR4 5F (CATHETERS) ×3 IMPLANT
DEVICE CLOSURE MYNXGRIP 5F (Vascular Products) ×3 IMPLANT
KIT MANI 3VAL PERCEP (MISCELLANEOUS) ×3 IMPLANT
NEEDLE PERC 18GX7CM (NEEDLE) ×3 IMPLANT
PACK CARDIAC CATH (CUSTOM PROCEDURE TRAY) ×3 IMPLANT
SHEATH AVANTI 5FR X 11CM (SHEATH) ×3 IMPLANT
WIRE GUIDERIGHT .035X150 (WIRE) ×3 IMPLANT

## 2020-05-27 ENCOUNTER — Encounter: Payer: Medicaid Other | Admitting: Obstetrics and Gynecology

## 2020-08-31 ENCOUNTER — Other Ambulatory Visit: Payer: Self-pay | Admitting: Family Medicine

## 2020-08-31 DIAGNOSIS — Z1231 Encounter for screening mammogram for malignant neoplasm of breast: Secondary | ICD-10-CM

## 2020-09-01 ENCOUNTER — Encounter: Payer: Medicaid Other | Admitting: Obstetrics and Gynecology

## 2020-09-22 ENCOUNTER — Encounter: Payer: Medicaid Other | Admitting: Obstetrics and Gynecology

## 2020-09-26 ENCOUNTER — Encounter: Payer: Self-pay | Admitting: Obstetrics and Gynecology

## 2020-10-25 IMAGING — US ULTRASOUND RIGHT BREAST LIMITED
1 series · 4 of 4 positions shown · non-contrast
Comparison: Previous exam(s).

CLINICAL DATA: 47-year-old female presenting for follow-up of a
probably benign right breast mass.

EXAM:
ULTRASOUND OF THE RIGHT BREAST

[Series 1: ultrasound right breast limited · 0.06mm/px · 4 of 4 slices shown]
[im 1/4]
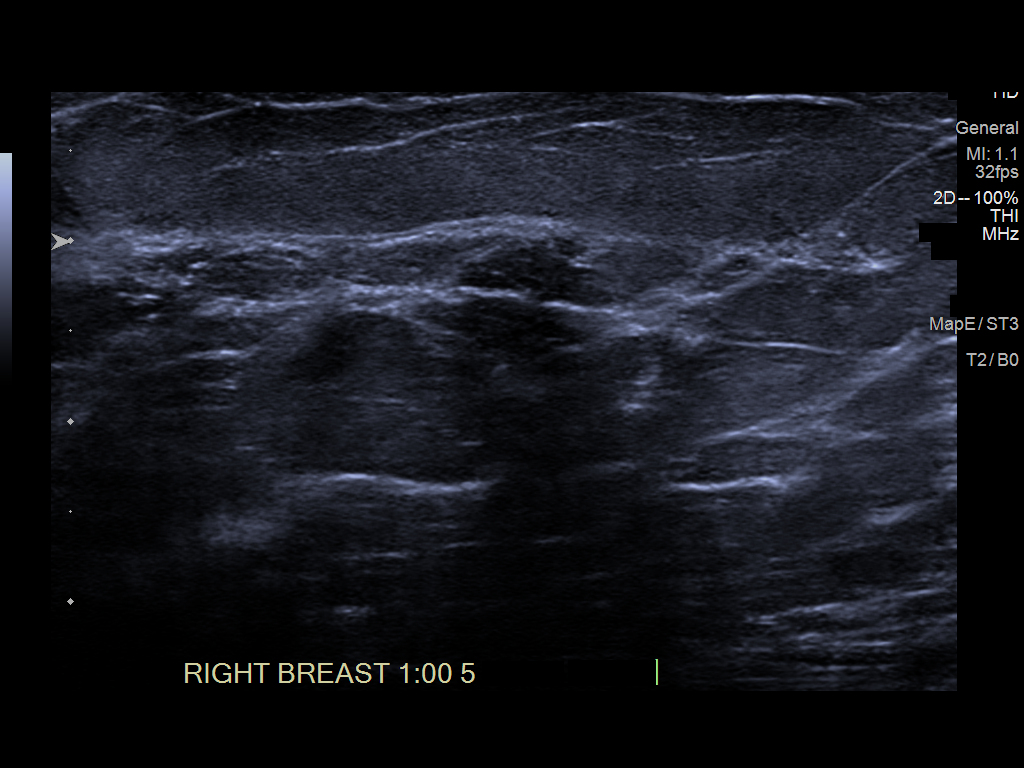
[im 2/4]
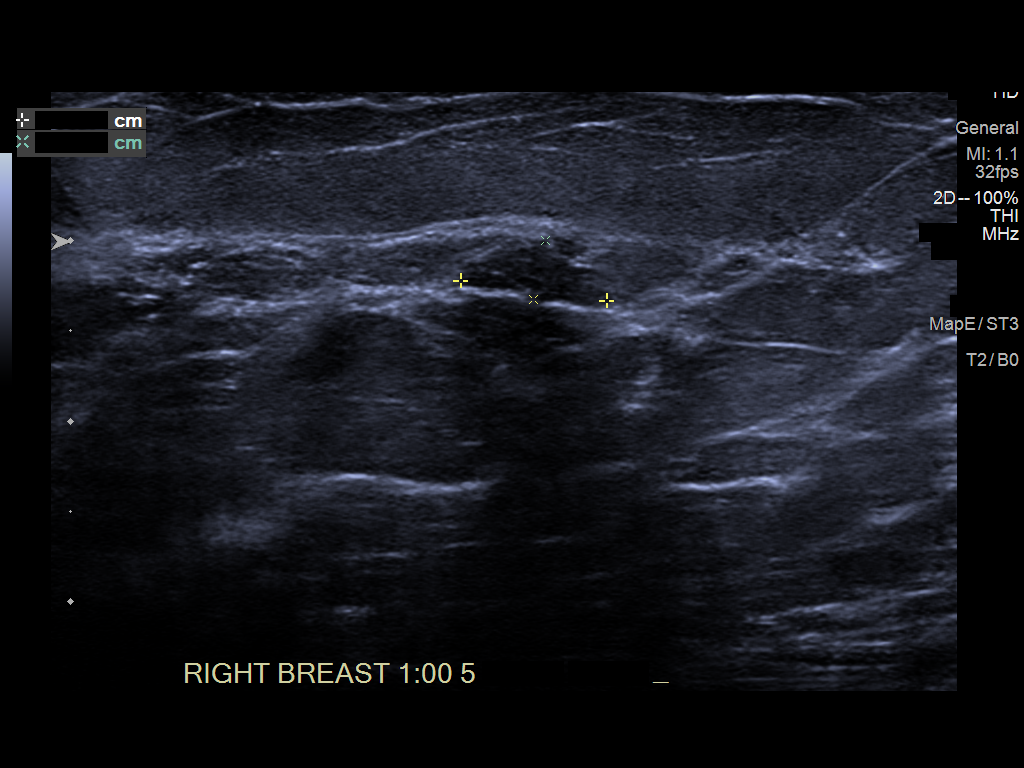
[im 3/4]
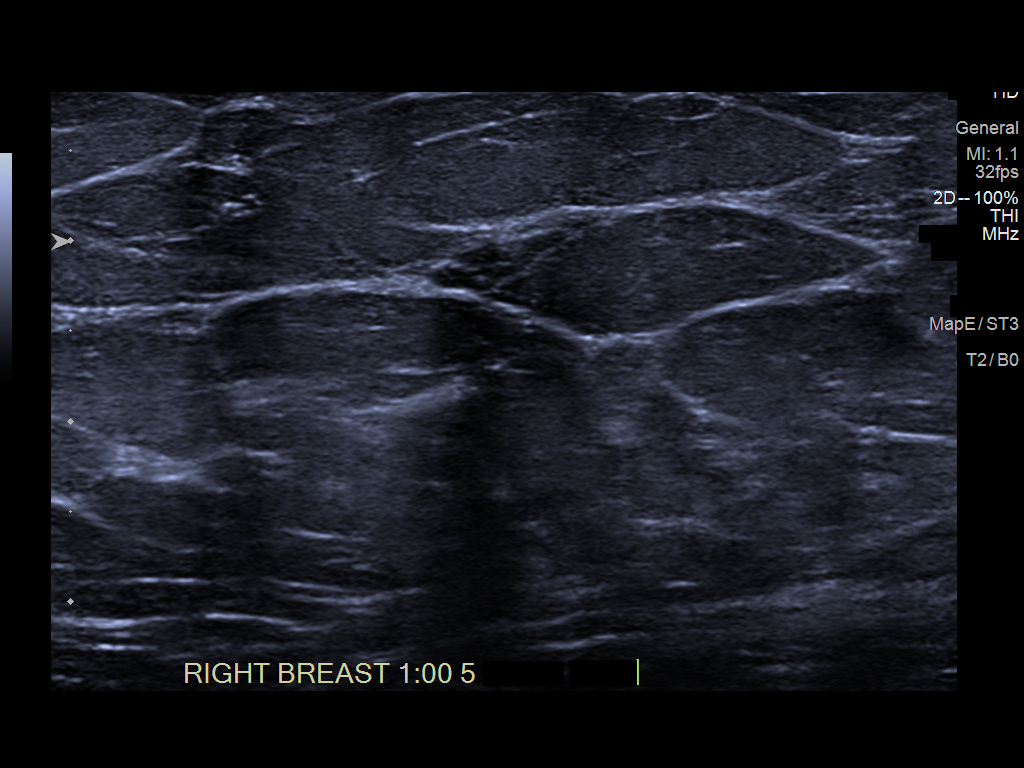
[im 4/4]
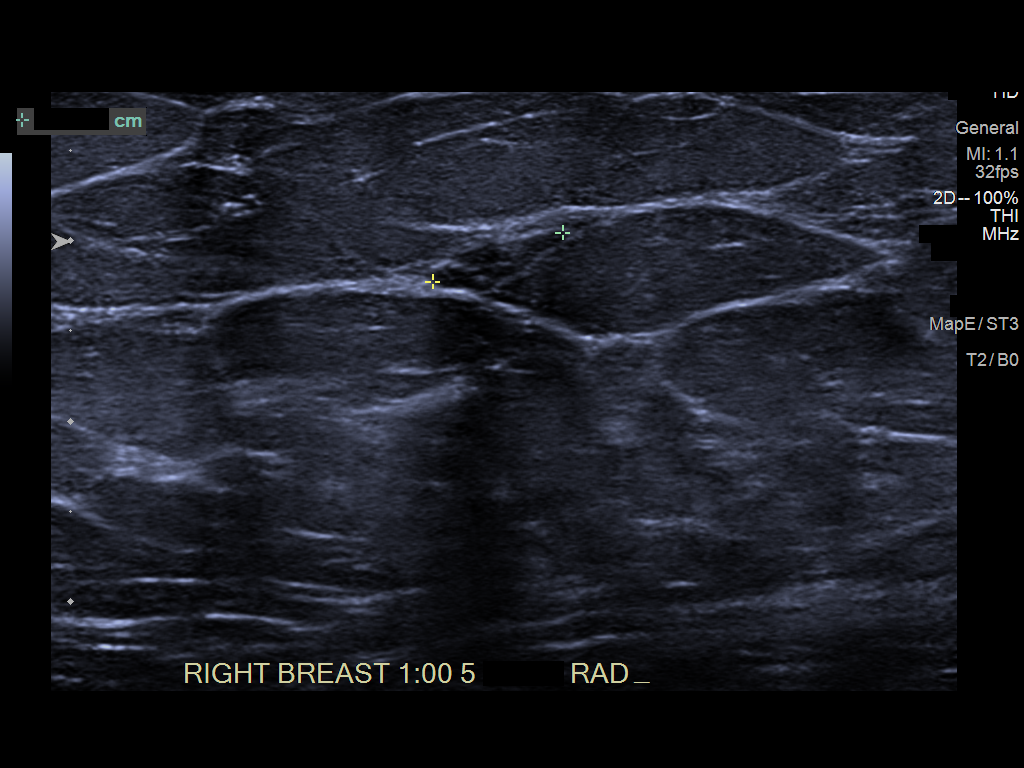

[4 of 4 positions shown; findings below may reference images not displayed]

FINDINGS: Ultrasound of the right breast at 1 o'clock, 5 cm from the nipple
demonstrates an oval hypoechoic mass measuring 8 x 3 x 8 mm, not
significantly changed from prior previously 7 x 3 x 4 mm. The
difference in measurement may be attributed to differences in
orientation in the radial plane.
IMPRESSION: No significant interval change in the right breast mass at 1
o'clock.

RECOMMENDATION:
Bilateral diagnostic mammogram and right breast ultrasound is
recommended in Friday September, 2018.

I have discussed the findings and recommendations with the patient.
Results were also provided in writing at the conclusion of the
visit. If applicable, a reminder letter will be sent to the patient
regarding the next appointment.

BI-RADS CATEGORY  3: Probably benign.

## 2020-12-13 ENCOUNTER — Other Ambulatory Visit (HOSPITAL_COMMUNITY)
Admission: RE | Admit: 2020-12-13 | Discharge: 2020-12-13 | Disposition: A | Discharge status: Home / Self Care | Payer: Medicaid Other | Source: Ambulatory Visit | Attending: Obstetrics and Gynecology | Admitting: Obstetrics and Gynecology

## 2020-12-13 ENCOUNTER — Encounter: Payer: Self-pay | Admitting: Obstetrics and Gynecology

## 2020-12-13 ENCOUNTER — Other Ambulatory Visit: Payer: Self-pay

## 2020-12-13 ENCOUNTER — Ambulatory Visit (INDEPENDENT_AMBULATORY_CARE_PROVIDER_SITE_OTHER): Payer: Medicaid Other | Admitting: Obstetrics and Gynecology

## 2020-12-13 VITALS — BP 101/70 | HR 89 | Ht 65.0 in | Wt 221.5 lb

## 2020-12-13 DIAGNOSIS — Z9889 Other specified postprocedural states: Secondary | ICD-10-CM

## 2020-12-13 DIAGNOSIS — Z124 Encounter for screening for malignant neoplasm of cervix: Secondary | ICD-10-CM | POA: Diagnosis present

## 2020-12-13 DIAGNOSIS — N871 Moderate cervical dysplasia: Secondary | ICD-10-CM | POA: Diagnosis not present

## 2020-12-13 DIAGNOSIS — Z30431 Encounter for routine checking of intrauterine contraceptive device: Secondary | ICD-10-CM | POA: Diagnosis not present

## 2020-12-13 DIAGNOSIS — Z0000 Encounter for general adult medical examination without abnormal findings: Secondary | ICD-10-CM | POA: Diagnosis not present

## 2020-12-13 DIAGNOSIS — Z01419 Encounter for gynecological examination (general) (routine) without abnormal findings: Secondary | ICD-10-CM | POA: Diagnosis not present

## 2020-12-13 NOTE — Progress Notes (Signed)
HPI:      [name] is a 50 y.o. [phone] who LMP was No LMP recorded (lmp unknown). (Menstrual status: IUD).  Subjective:   She presents today for her annual examination.  She has no complaints today.  She has a past history of LEEP for CIN-2.  Her last 2 Pap smears have been normal.  She has an IUD for birth control/cycle control and has no complaints regarding her IUD.    Hx: The following portions of the patient's history were reviewed and updated as appropriate:             She  has a past medical history of Anemia, Anxiety, Aortic regurgitation, Back pain, CHF (congestive heart failure) ([place]), COPD (chronic obstructive pulmonary disease) ([place]), Depression, Dyspnea, Gall stones, GERD (gastroesophageal reflux disease), Headache, History of kidney stones, Hypertension, Hypothyroidism, Kidney atrophy, Oxygen deficit, Pneumonia, PONV (postoperative nausea and vomiting), Thyroid disease, and Vertigo. She does not have any pertinent problems on file. She  has a past surgical history that includes Cholecystectomy; Cholecystectomy (N/A, 02/28/2018); LEEP (N/A, 02/16/2019); and LEFT HEART CATH AND CORONARY ANGIOGRAPHY (Left, 04/14/2020). Her family history includes Cancer in her father and paternal grandmother; Diabetes in an other family member; Heart failure in an other family member; Hypertension in her maternal grandfather; Stroke in her maternal grandfather; Thyroid disease in her mother. She  reports that she has been smoking cigarettes. She has a 20.00 pack-year smoking history. She has never used smokeless tobacco. She reports that she does not drink alcohol and does not use drugs. She has a current medication list which includes the following prescription(s): albuterol, alprazolam, budesonide-formoterol, bupropion, carvedilol, famotidine, ferrous sulfate, folic acid, furosemide, gemfibrozil, levocetirizine, losartan, meclizine, nitroglycerin, oxygen-helium, potassium chloride er,  quetiapine, sucralfate, ascorbic acid, vitamin d3, ezetimibe, and levothyroxine, and the following Facility-Administered Medications: sodium chloride flush. She is allergic to lactose intolerance (gi) and other.       Review of Systems:  Review of Systems  Constitutional: Denied constitutional symptoms, night sweats, recent illness, fatigue, fever, insomnia and weight loss.  Eyes: Denied eye symptoms, eye pain, photophobia, vision change and visual disturbance.  Ears/Nose/Throat/Neck: Denied ear, nose, throat or neck symptoms, hearing loss, nasal discharge, sinus congestion and sore throat.  Cardiovascular: Denied cardiovascular symptoms, arrhythmia, chest pain/pressure, edema, exercise intolerance, orthopnea and palpitations.  Respiratory: Denied pulmonary symptoms, asthma, pleuritic pain, productive sputum, cough, dyspnea and wheezing.  Gastrointestinal: Denied, gastro-esophageal reflux, melena, nausea and vomiting.  Genitourinary: Denied genitourinary symptoms including symptomatic vaginal discharge, pelvic relaxation issues, and urinary complaints.  Musculoskeletal: Denied musculoskeletal symptoms, stiffness, swelling, muscle weakness and myalgia.  Dermatologic: Denied dermatology symptoms, rash and scar.  Neurologic: Denied neurology symptoms, dizziness, headache, neck pain and syncope.  Psychiatric: Denied psychiatric symptoms, anxiety and depression.  Endocrine: Denied endocrine symptoms including hot flashes and night sweats.   Meds:   Current Outpatient Medications on File Prior to Visit  Medication Sig Dispense Refill  . albuterol (PROVENTIL HFA;VENTOLIN HFA) 108 (90 BASE) MCG/ACT inhaler Inhale 2 puffs into the lungs every 4 (four) hours as needed for wheezing or shortness of breath.    . ALPRAZolam (XANAX) 1 MG tablet Take 1 mg by mouth 3 (three) times daily as needed for anxiety.     . budesonide-formoterol (SYMBICORT) 160-4.5 MCG/ACT inhaler Inhale 2 puffs into the lungs every  12 (twelve) hours.    Marland Kitchen buPROPion (WELLBUTRIN XL) 300 MG 24 hr tablet Take 300 mg by mouth daily.   2  .  carvedilol (COREG) 12.5 MG tablet Take 12.5 mg by mouth in the morning and at bedtime.    . famotidine (PEPCID) 20 MG tablet Take 20 mg by mouth 2 (two) times daily.    . ferrous sulfate 325 (65 FE) MG tablet Take 325 mg by mouth daily with breakfast.    . folic acid (FOLVITE) 1 MG tablet Take 1 mg by mouth daily.    . furosemide (LASIX) 20 MG tablet Take 60 mg by mouth daily.     Marland Kitchen gemfibrozil (LOPID) 600 MG tablet Take 600 mg by mouth 2 (two) times daily.    Marland Kitchen levocetirizine (XYZAL) 5 MG tablet Take 5 mg by mouth every evening.    Marland Kitchen losartan (COZAAR) 25 MG tablet Take 25 mg by mouth daily.     . meclizine (ANTIVERT) 25 MG tablet Take 25 mg by mouth 2 (two) times daily.  2  . nitroGLYCERIN (NITROSTAT) 0.3 MG SL tablet Place 0.3-0.6 mg under the tongue 2 (two) times daily as needed for chest pain.    . OXYGEN Inhale 2 L into the lungs at bedtime.    . Potassium Chloride ER 20 MEQ TBCR Take 30 mEq by mouth daily.    . QUEtiapine (SEROQUEL) 100 MG tablet Take 100 mg by mouth at bedtime.    . sucralfate (CARAFATE) 1 g tablet Take 1 tablet (1 g total) by mouth 4 (four) times daily. (Patient taking differently: Take 1 g by mouth in the morning, at noon, in the evening, and at bedtime.) 60 tablet 0  . ascorbic acid (VITAMIN C) 500 MG tablet Take 500 mg by mouth daily. (Patient not taking: Reported on 12/13/2020)    . Cholecalciferol (VITAMIN D3) 125 MCG (5000 UT) TABS Take 5,000 Units by mouth daily. (Patient not taking: Reported on 12/13/2020)    . ezetimibe (ZETIA) 10 MG tablet Take 10 mg by mouth at bedtime.  (Patient not taking: Reported on 04/14/2020)  2  . levothyroxine (SYNTHROID) 50 MCG tablet Take 50 mcg by mouth daily before breakfast.  (Patient not taking: Reported on 12/13/2020)     Current Facility-Administered Medications on File Prior to Visit  Medication Dose Route Frequency Provider Last  Rate Last Admin  . sodium chloride flush (NS) 0.9 % injection 3 mL  3 mL Intravenous Q12H Dionisio Merla Sawka, MD           The pregnancy intention screening data noted above was reviewed. Potential methods of contraception were discussed. The patient elected to proceed with IUD or IUS.     Objective:     Vitals:   12/13/20 1338  BP: 101/70  Pulse: 89    Filed Weights   12/13/20 1338  Weight: 221 lb 8 oz (100.5 kg)              Physical examination General NAD, Conversant  HEENT Atraumatic; Op clear with mmm.  Normo-cephalic. Pupils reactive. Anicteric sclerae  Thyroid/Neck Smooth without nodularity or enlargement. Normal ROM.  Neck Supple.  Skin No rashes, lesions or ulceration. Normal palpated skin turgor. No nodularity.  Breasts: No masses or discharge.  Symmetric.  No axillary adenopathy.  Lungs: Clear to auscultation.No rales or wheezes. Normal Respiratory effort, no retractions.  Heart: NSR.  No murmurs or rubs appreciated. No periferal edema  Abdomen: Soft.  Non-tender.  No masses.  No HSM. No hernia  Extremities: Moves all appropriately.  Normal ROM for age. No lymphadenopathy.  Neuro: Oriented to PPT.  Normal mood. Normal affect.  Pelvic:   Vulva: Normal appearance.  No lesions.  Vagina: No lesions or abnormalities noted.  Support: Normal pelvic support.  Urethra No masses tenderness or scarring.  Meatus Normal size without lesions or prolapse.  Cervix: Normal appearance.  No lesions.  IUD strings noted in the cervical os  Anus: Normal exam.  No lesions.  Perineum: Normal exam.  No lesions.        Bimanual   Uterus: Normal size.  Non-tender.  Mobile.  AV.  Adnexae: No masses.  Non-tender to palpation.  Cul-de-sac: Negative for abnormality.      Assessment:    [id number] Patient Active Problem List   Diagnosis Date Noted  . Unstable angina ([place]) 04/08/2020  . S/P laparoscopic cholecystectomy 02/28/2018  . Gallstones 02/28/2018  . Malnutrition of moderate  degree 06/30/2015  . RUQ abdominal pain   . Acute cholecystitis 06/28/2015  . Allergic reaction 05/26/2015     1. Well woman exam with routine gynecological exam   2. CIN II (cervical intraepithelial neoplasia II)   3. History of loop electrical excision procedure (LEEP)   4. IUD check up        Plan:            1.  Basic Screening Recommendations The basic screening recommendations for asymptomatic women were discussed with the patient during her visit.  The age-appropriate recommendations were discussed with her and the rational for the tests reviewed.  When I am informed by the patient that another primary care physician has previously obtained the age-appropriate tests and they are up-to-date, only outstanding tests are ordered and referrals given as necessary.  Abnormal results of tests will be discussed with her when all of her results are completed.  Routine preventative health maintenance measures emphasized: Exercise/Diet/Weight control, Tobacco Warnings, Alcohol/Substance use risks and Stress Management Pap Co-test -if this is normal plan Pap in 1 year.  If that one is normal patient may transition to Paps every 3 years. Mammography and blood work with PCP. Orders No orders of the defined types were placed in this encounter.   No orders of the defined types were placed in this encounter.        F/U  Return in about 1 year (around 12/13/2021) for Annual Physical.  Finis Bud, M.D. 12/13/2020 1:59 PM

## 2020-12-13 NOTE — Addendum Note (Signed)
Addended by: Durwin Glaze on: 12/13/2020 02:07 PM   Modules accepted: Orders

## 2020-12-21 LAB — CYTOLOGY - PAP
Comment: NEGATIVE
Comment: NEGATIVE
Diagnosis: HIGH — AB
HPV 16: POSITIVE — AB
HPV 18 / 45: NEGATIVE
High risk HPV: POSITIVE — AB

## 2021-01-06 ENCOUNTER — Other Ambulatory Visit: Payer: Self-pay

## 2021-01-06 ENCOUNTER — Other Ambulatory Visit (HOSPITAL_COMMUNITY)
Admission: RE | Admit: 2021-01-06 | Discharge: 2021-01-06 | Disposition: A | Discharge status: Home / Self Care | Payer: Medicaid Other | Source: Ambulatory Visit | Attending: Obstetrics and Gynecology | Admitting: Obstetrics and Gynecology

## 2021-01-06 ENCOUNTER — Encounter: Payer: Self-pay | Admitting: Obstetrics and Gynecology

## 2021-01-06 ENCOUNTER — Ambulatory Visit (INDEPENDENT_AMBULATORY_CARE_PROVIDER_SITE_OTHER): Payer: Medicaid Other | Admitting: Obstetrics and Gynecology

## 2021-01-06 VITALS — BP 103/72 | HR 88 | Ht 65.0 in | Wt 223.3 lb

## 2021-01-06 DIAGNOSIS — B977 Papillomavirus as the cause of diseases classified elsewhere: Secondary | ICD-10-CM | POA: Insufficient documentation

## 2021-01-06 DIAGNOSIS — R87610 Atypical squamous cells of undetermined significance on cytologic smear of cervix (ASC-US): Secondary | ICD-10-CM | POA: Insufficient documentation

## 2021-01-06 DIAGNOSIS — N72 Inflammatory disease of cervix uteri: Secondary | ICD-10-CM | POA: Insufficient documentation

## 2021-01-06 NOTE — Progress Notes (Signed)
HPI:  Grace Fox is a 50 y.o.  [phone]  who presents today for evaluation and management of abnormal cervical cytology.    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  Her last 2 Pap smears after LEEP have been normal. 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  ROS:  Pertinent items noted in HPI and remainder of comprehensive ROS otherwise negative.  OB History  Gravida Para Term Preterm AB Living  '6 6 5 1   6  '$ SAB IAB Ectopic Multiple Live Births          6    # Outcome Date GA Lbr Len/2nd Weight Sex Delivery Anes PTL Lv  6 Preterm 2009   5 lb 13 oz (2.637 kg) F Vag-Spont  Y LIV  5 Term 2005   8 lb 3 oz (3.714 kg) F Vag-Spont  N LIV  4 Term 1998   8 lb 15 oz (4.054 kg) F Vag-Spont  Y LIV  3 Term 1997   8 lb 13 oz (3.997 kg) F Vag-Spont  Y LIV  2 Term 1995   8 lb 3 oz (3.714 kg) M Vag-Spont  Y LIV  1 Term 1993   7 lb 1.5 oz (3.218 kg) F Vag-Spont  Y LIV     Complications: Pre-eclampsia    Past Medical History:  Diagnosis Date   Anemia    Anxiety    Aortic regurgitation    PT STATES SHE NEEDS VALVE REPLACMENT-SEE DR Earlyne Iba KHAN   Back pain    SCIATICA   CHF (congestive heart failure) ([facility])    COPD (chronic obstructive pulmonary disease) ([facility])    Depression    Dyspnea    DOE   Gall stones    GERD (gastroesophageal reflux disease)    Headache    migraines   History of kidney stones    h/o   Hypertension    Hypothyroidism    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   Oxygen deficit    2L/ HS   Pneumonia    PONV (postoperative nausea and vomiting)    with GA   Thyroid disease    Vertigo     Past Surgical History:  Procedure Laterality Date   CHOLECYSTECTOMY     CHOLECYSTECTOMY N/A 02/28/2018   Procedure: LAPAROSCOPIC CHOLECYSTECTOMY WITH INTRAOPERATIVE CHOLANGIOGRAM;  Surgeon: Jovita Kussmaul, MD;  Location: [facility];  Service: General;   Laterality: N/A;   LEEP N/A 02/16/2019   Procedure: LOOP ELECTROSURGICAL EXCISION PROCEDURE (LEEP);  Surgeon: Harlin Heys, MD;  Location: [facility];  Service: Gynecology;  Laterality: N/A;   LEFT HEART CATH AND CORONARY ANGIOGRAPHY Left 04/14/2020   Procedure: LEFT HEART CATH AND CORONARY ANGIOGRAPHY;  Surgeon: Dionisio Polk Minor, MD;  Location: [facility];  Service: Cardiovascular;  Laterality: Left;    SOCIAL HISTORY:  Social History   Substance and Sexual Activity  Alcohol Use No    Social History   Substance and Sexual Activity  Drug Use No     Family History  Problem Relation Age of Onset   Diabetes Other    Heart failure Other    Thyroid disease Mother    Cancer Father    Hypertension Maternal Grandfather    Stroke Maternal Grandfather    Cancer Paternal Grandmother    Breast cancer Neg Hx  ALLERGIES:  Lactose intolerance (gi) and Other  She has a current medication list which includes the following prescription(s): albuterol, alprazolam, budesonide-formoterol, bupropion, famotidine, folic acid, furosemide, gemfibrozil, levocetirizine, levothyroxine, losartan, meclizine, nitroglycerin, oxygen-helium, potassium chloride er, quetiapine, sucralfate, ascorbic acid, carvedilol, vitamin d3, ezetimibe, and ferrous sulfate, and the following Facility-Administered Medications: sodium chloride flush.  Physical Exam: -Vitals:  BP 103/72   Pulse 88   Ht '5\' 5"'$  (1.651 m)   Wt 223 lb 4.8 oz (101.3 kg)   LMP  (LMP Unknown)   BMI 37.16 kg/m   PROCEDURE: Colposcopy performed with 4% acetic acid after verbal consent obtained                           -Aceto-white Lesions Location(s): 4 o'clock.              -Biopsy performed at 4 o'clock               -ECC indicated and performed: No.     -Biopsy sites made hemostatic with pressure and Monsel's solution   -Satisfactory colposcopy: Yes.      -Evidence of Invasive cervical CA :  NO  ASSESSMENT:  Grace Fox is a 50 y.o. [phone] here for  1. High risk human papilloma virus (HPV) infection of cervix   2. Atypical squamous cells of undetermined significance on cytologic smear of cervix (ASC-US)   .  PLAN: 1.  I discussed the grading system of pap smears and HPV high risk viral types.  We will discuss management after colpo results return.  No orders of the defined types were placed in this encounter.          F/U  Return in about 2 weeks (around 01/20/2021) for Colpo f/u.  Jeannie Fend ,MD 01/06/2021,11:39 AM

## 2021-01-10 LAB — SURGICAL PATHOLOGY

## 2021-01-20 ENCOUNTER — Ambulatory Visit (INDEPENDENT_AMBULATORY_CARE_PROVIDER_SITE_OTHER): Payer: Medicaid Other | Admitting: Obstetrics and Gynecology

## 2021-01-20 ENCOUNTER — Other Ambulatory Visit: Payer: Self-pay

## 2021-01-20 ENCOUNTER — Encounter: Payer: Self-pay | Admitting: Obstetrics and Gynecology

## 2021-01-20 VITALS — BP 102/68 | HR 103 | Ht 65.0 in | Wt 227.1 lb

## 2021-01-20 DIAGNOSIS — B977 Papillomavirus as the cause of diseases classified elsewhere: Secondary | ICD-10-CM

## 2021-01-20 DIAGNOSIS — N72 Inflammatory disease of cervix uteri: Secondary | ICD-10-CM | POA: Diagnosis not present

## 2021-01-20 NOTE — Progress Notes (Signed)
HPI:      [name] is a 50 y.o. [phone] who LMP was No LMP recorded. (Menstrual status: IUD).  Subjective:   She presents today for follow-up after colposcopy.  She had a LEEP for CIN-2 which was persistent.  Her most recent Pap smear showed ASCUS and she has known HPV type 16.  She presented for repeat colposcopy.  She is here today to discuss those results. Patient states that she has "a lot of cancer that runs in her family" and that she has recently stopped smoking.     Hx: The following portions of the patient's history were reviewed and updated as appropriate:             She  has a past medical history of Anemia, Anxiety, Aortic regurgitation, Back pain, CHF (congestive heart failure) ([place]), COPD (chronic obstructive pulmonary disease) ([place]), Depression, Dyspnea, Gall stones, GERD (gastroesophageal reflux disease), Headache, History of kidney stones, Hypertension, Hypothyroidism, Kidney atrophy, Oxygen deficit, Pneumonia, PONV (postoperative nausea and vomiting), Thyroid disease, and Vertigo. She does not have any pertinent problems on file. She  has a past surgical history that includes Cholecystectomy; Cholecystectomy (N/A, 02/28/2018); LEEP (N/A, 02/16/2019); and LEFT HEART CATH AND CORONARY ANGIOGRAPHY (Left, 04/14/2020). Her family history includes Cancer in her father and paternal grandmother; Diabetes in an other family member; Heart failure in an other family member; Hypertension in her maternal grandfather; Stroke in her maternal grandfather; Thyroid disease in her mother. She  reports that she has been smoking cigarettes. She has a 20.00 pack-year smoking history. She has never used smokeless tobacco. She reports that she does not drink alcohol and does not use drugs. She has a current medication list which includes the following prescription(s): albuterol, alprazolam, ascorbic acid, budesonide-formoterol, bupropion, carvedilol, vitamin d3, ezetimibe, famotidine, ferrous  sulfate, folic acid, furosemide, gemfibrozil, levocetirizine, levothyroxine, losartan, meclizine, nitroglycerin, oxygen-helium, potassium chloride er, quetiapine, and sucralfate, and the following Facility-Administered Medications: sodium chloride flush. She is allergic to lactose intolerance (gi) and other.       Review of Systems:  Review of Systems  Constitutional: Denied constitutional symptoms, night sweats, recent illness, fatigue, fever, insomnia and weight loss.  Eyes: Denied eye symptoms, eye pain, photophobia, vision change and visual disturbance.  Ears/Nose/Throat/Neck: Denied ear, nose, throat or neck symptoms, hearing loss, nasal discharge, sinus congestion and sore throat.  Cardiovascular: Denied cardiovascular symptoms, arrhythmia, chest pain/pressure, edema, exercise intolerance, orthopnea and palpitations.  Respiratory: Denied pulmonary symptoms, asthma, pleuritic pain, productive sputum, cough, dyspnea and wheezing.  Gastrointestinal: Denied, gastro-esophageal reflux, melena, nausea and vomiting.  Genitourinary: Denied genitourinary symptoms including symptomatic vaginal discharge, pelvic relaxation issues, and urinary complaints.  Musculoskeletal: Denied musculoskeletal symptoms, stiffness, swelling, muscle weakness and myalgia.  Dermatologic: Denied dermatology symptoms, rash and scar.  Neurologic: Denied neurology symptoms, dizziness, headache, neck pain and syncope.  Psychiatric: Denied psychiatric symptoms, anxiety and depression.  Endocrine: Denied endocrine symptoms including hot flashes and night sweats.   Meds:   Current Outpatient Medications on File Prior to Visit  Medication Sig Dispense Refill   albuterol (PROVENTIL HFA;VENTOLIN HFA) 108 (90 BASE) MCG/ACT inhaler Inhale 2 puffs into the lungs every 4 (four) hours as needed for wheezing or shortness of breath.     ALPRAZolam (XANAX) 1 MG tablet Take 1 mg by mouth 3 (three) times daily as needed for anxiety.       ascorbic acid (VITAMIN C) 500 MG tablet Take 500 mg by mouth daily. (Patient not taking: Reported on 12/13/2020)  budesonide-formoterol (SYMBICORT) 160-4.5 MCG/ACT inhaler Inhale 2 puffs into the lungs every 12 (twelve) hours.     buPROPion (WELLBUTRIN XL) 300 MG 24 hr tablet Take 300 mg by mouth daily.   2   carvedilol (COREG) 12.5 MG tablet Take 12.5 mg by mouth in the morning and at bedtime. (Patient not taking: Reported on 01/06/2021)     Cholecalciferol (VITAMIN D3) 125 MCG (5000 UT) TABS Take 5,000 Units by mouth daily. (Patient not taking: Reported on 12/13/2020)     ezetimibe (ZETIA) 10 MG tablet Take 10 mg by mouth at bedtime.  (Patient not taking: Reported on 04/14/2020)  2   famotidine (PEPCID) 20 MG tablet Take 20 mg by mouth 2 (two) times daily.     ferrous sulfate 325 (65 FE) MG tablet Take 325 mg by mouth daily with breakfast.     folic acid (FOLVITE) 1 MG tablet Take 1 mg by mouth daily.     furosemide (LASIX) 20 MG tablet Take 60 mg by mouth daily.      gemfibrozil (LOPID) 600 MG tablet Take 600 mg by mouth 2 (two) times daily.     levocetirizine (XYZAL) 5 MG tablet Take 5 mg by mouth every evening.     levothyroxine (SYNTHROID) 50 MCG tablet Take 50 mcg by mouth daily before breakfast.     losartan (COZAAR) 25 MG tablet Take 25 mg by mouth daily.      meclizine (ANTIVERT) 25 MG tablet Take 25 mg by mouth 2 (two) times daily.  2   nitroGLYCERIN (NITROSTAT) 0.3 MG SL tablet Place 0.3-0.6 mg under the tongue 2 (two) times daily as needed for chest pain.     OXYGEN Inhale 2 L into the lungs at bedtime.     Potassium Chloride ER 20 MEQ TBCR Take 30 mEq by mouth daily.     QUEtiapine (SEROQUEL) 100 MG tablet Take 100 mg by mouth at bedtime.     sucralfate (CARAFATE) 1 g tablet Take 1 tablet (1 g total) by mouth 4 (four) times daily. (Patient taking differently: Take 1 g by mouth in the morning, at noon, in the evening, and at bedtime.) 60 tablet 0   Current Facility-Administered  Medications on File Prior to Visit  Medication Dose Route Frequency Provider Last Rate Last Admin   sodium chloride flush (NS) 0.9 % injection 3 mL  3 mL Intravenous Q12H Dionisio Kaylanni Ezelle, MD              Objective:     Vitals:   01/20/21 1057  BP: 102/68  Pulse: (!) 103   Filed Weights   01/20/21 1057  Weight: 227 lb 1.6 oz (103 kg)              Colposcopically directed biopsy shows no evidence of dysplasia  Assessment:    [id number] Patient Active Problem List   Diagnosis Date Noted   Unstable angina ([place]) 04/08/2020   S/P laparoscopic cholecystectomy 02/28/2018   Gallstones 02/28/2018   Malnutrition of moderate degree 06/30/2015   RUQ abdominal pain    Acute cholecystitis 06/28/2015   Allergic reaction 05/26/2015     1. High risk human papilloma virus (HPV) infection of cervix     No evidence of dysplasia at this time.   Plan:            1.  Recommend follow-up Pap smear in 1 year. Orders No orders of the defined types were placed in this encounter.   No orders  of the defined types were placed in this encounter.     F/U  Return for Annual Physical. I spent 13 minutes involved in the care of this patient preparing to see the patient by obtaining and reviewing her medical history (including labs, imaging tests and prior procedures), documenting clinical information in the electronic health record (EHR), counseling and coordinating care plans, writing and sending prescriptions, ordering tests or procedures and directly communicating with the patient by discussing pertinent items from her history and physical exam as well as detailing my assessment and plan as noted above so that she has an informed understanding.  All of her questions were answered.  Finis Bud, M.D. 01/20/2021 11:30 AM

## 2021-04-03 ENCOUNTER — Inpatient Hospital Stay: Payer: Medicaid Other

## 2021-04-03 ENCOUNTER — Inpatient Hospital Stay: Payer: Medicaid Other | Attending: Oncology | Admitting: Oncology

## 2021-04-17 ENCOUNTER — Inpatient Hospital Stay: Payer: Medicaid Other | Attending: Oncology | Admitting: Oncology

## 2021-04-17 ENCOUNTER — Inpatient Hospital Stay: Payer: Medicaid Other

## 2021-04-17 ENCOUNTER — Encounter: Payer: Self-pay | Admitting: Oncology

## 2021-04-17 ENCOUNTER — Telehealth: Payer: Self-pay | Admitting: *Deleted

## 2021-04-17 ENCOUNTER — Other Ambulatory Visit: Payer: Self-pay

## 2021-04-17 DIAGNOSIS — D75839 Thrombocytosis, unspecified: Secondary | ICD-10-CM | POA: Insufficient documentation

## 2021-04-17 DIAGNOSIS — D649 Anemia, unspecified: Secondary | ICD-10-CM | POA: Insufficient documentation

## 2021-04-17 DIAGNOSIS — D75829 Heparin-induced thrombocytopenia, unspecified: Secondary | ICD-10-CM | POA: Diagnosis not present

## 2021-04-17 DIAGNOSIS — F17210 Nicotine dependence, cigarettes, uncomplicated: Secondary | ICD-10-CM | POA: Diagnosis not present

## 2021-04-17 LAB — CBC WITH DIFFERENTIAL/PLATELET
Abs Immature Granulocytes: 0.05 10*3/uL (ref 0.00–0.07)
Basophils Absolute: 0.1 10*3/uL (ref 0.0–0.1)
Basophils Relative: 1 %
Eosinophils Absolute: 0.2 10*3/uL (ref 0.0–0.5)
Eosinophils Relative: 2 %
HCT: 32.6 % — ABNORMAL LOW (ref 36.0–46.0)
Hemoglobin: 10.8 g/dL — ABNORMAL LOW (ref 12.0–15.0)
Immature Granulocytes: 1 %
Lymphocytes Relative: 24 %
Lymphs Abs: 1.9 10*3/uL (ref 0.7–4.0)
MCH: 30.4 pg (ref 26.0–34.0)
MCHC: 33.1 g/dL (ref 30.0–36.0)
MCV: 91.8 fL (ref 80.0–100.0)
Monocytes Absolute: 0.5 10*3/uL (ref 0.1–1.0)
Monocytes Relative: 7 %
Neutro Abs: 5.3 10*3/uL (ref 1.7–7.7)
Neutrophils Relative %: 65 %
Platelets: 317 10*3/uL (ref 150–400)
RBC: 3.55 MIL/uL — ABNORMAL LOW (ref 3.87–5.11)
RDW: 12.9 % (ref 11.5–15.5)
WBC: 8 10*3/uL (ref 4.0–10.5)
nRBC: 0 % (ref 0.0–0.2)

## 2021-04-17 LAB — FOLATE: Folate: 34 ng/mL (ref >5.9)

## 2021-04-17 LAB — COMPREHENSIVE METABOLIC PANEL
ALT: 25 U/L (ref 0–44)
AST: 25 U/L (ref 15–41)
Albumin: 3.9 g/dL (ref 3.5–5.0)
Alkaline Phosphatase: 120 U/L (ref 38–126)
Anion gap: 9 (ref 5–15)
BUN: 23 mg/dL — ABNORMAL HIGH (ref 6–20)
CO2: 25 mmol/L (ref 22–32)
Calcium: 9.1 mg/dL (ref 8.9–10.3)
Chloride: 100 mmol/L (ref 98–111)
Creatinine, Ser: 1.86 mg/dL — ABNORMAL HIGH (ref 0.44–1.00)
GFR, Estimated: 33 mL/min — ABNORMAL LOW (ref >60)
Glucose, Bld: 132 mg/dL — ABNORMAL HIGH (ref 70–99)
Potassium: 2.7 mmol/L — CL (ref 3.5–5.1)
Sodium: 134 mmol/L — ABNORMAL LOW (ref 135–145)
Total Bilirubin: 0.2 mg/dL — ABNORMAL LOW (ref 0.3–1.2)
Total Protein: 7.8 g/dL (ref 6.5–8.1)

## 2021-04-17 LAB — RETICULOCYTES
Immature Retic Fract: 11.8 % (ref 2.3–15.9)
RBC.: 3.59 MIL/uL — ABNORMAL LOW (ref 3.87–5.11)
Retic Count, Absolute: 74.3 10*3/uL (ref 19.0–186.0)
Retic Ct Pct: 2.1 % (ref 0.4–3.1)

## 2021-04-17 LAB — TSH: TSH: 3.119 u[IU]/mL (ref 0.350–4.500)

## 2021-04-17 LAB — IRON AND TIBC
Iron: 54 ug/dL (ref 28–170)
Saturation Ratios: 15 % (ref 10.4–31.8)
TIBC: 368 ug/dL (ref 250–450)
UIBC: 314 ug/dL

## 2021-04-17 LAB — FERRITIN: Ferritin: 274 ng/mL (ref 11–307)

## 2021-04-17 LAB — VITAMIN B12: Vitamin B-12: 664 pg/mL (ref 180–914)

## 2021-04-17 NOTE — Telephone Encounter (Signed)
Called and spoke to staff- Carlyon Shadow powell asking if she can send latest cbc for [place] to review. The pt was sent here for low hgb and increase platelet. She will faxed I to [place]. Then I called back and got general voice mail and I left message that her PCP was taking care of potassium and her level was 2.7 today and wanted PCP to contact pt. And let her know about wht they are doing to help with the level to get better. Asked PCP  office to call me so I know they got the message

## 2021-04-17 NOTE — Progress Notes (Signed)
PT  drinks a lot of water. She is tired all the time. She has lots of anxiety. Sometimes eats good and other days- not so much. Bowels 2-3 a week since the surgery.

## 2021-04-17 NOTE — Progress Notes (Signed)
Hematology/Oncology Consult note [facility] Telephone:([phone] [phone]  Patient Care Team: Remi Haggard, FNP as PCP - General (Family Medicine)   Name of the patient: Grace Fox  [id number]  Dec 02, 1970    Reason for referral- anemia and thrombocytosis   Referring physician- Threasa Alpha FNP  Date of visit: 04/17/21   History of presenting illness- Patient is a 50 year old female with a past medical history significant for hypothyroidism hypertension CHF COPD and other medical problems.  She has been referred for anemia and thrombocytosis.  Most recent blood work from 03/01/2021 showed mildly elevated alkaline phosphatase of 169.  Calcium low at 8.5 and serum creatinine elevated at 2.  I do not see any results of CBC in the scanned lab records.  Patient currently reports feeling fatigued.  She is on multiple medications for her chronic medical issues including depression.  Denies any blood loss in her stool or urine.  Denies any dark melanotic stools.  ECOG PS- 1  Pain scale- 0   Review of systems- Review of Systems  Constitutional:  Positive for malaise/fatigue. Negative for chills, fever and weight loss.  HENT:  Negative for congestion, ear discharge and nosebleeds.   Eyes:  Negative for blurred vision.  Respiratory:  Negative for cough, hemoptysis, sputum production, shortness of breath and wheezing.   Cardiovascular:  Negative for chest pain, palpitations, orthopnea and claudication.  Gastrointestinal:  Negative for abdominal pain, blood in stool, constipation, diarrhea, heartburn, melena, nausea and vomiting.  Genitourinary:  Negative for dysuria, flank pain, frequency, hematuria and urgency.  Musculoskeletal:  Negative for back pain, joint pain and myalgias.  Skin:  Negative for rash.  Neurological:  Negative for dizziness, tingling, focal weakness, seizures, weakness and headaches.  Endo/Heme/Allergies:  Does not  bruise/bleed easily.  Psychiatric/Behavioral:  Negative for depression and suicidal ideas. The patient does not have insomnia.    Allergies  Allergen Reactions   Lactose Intolerance (Gi) Other (See Comments)    Gi upset   Other Other (See Comments)    Grass- stuffy nose/itching/sneezing Pollen- stuffy nose/itching/sneezing    Patient Active Problem List   Diagnosis Date Noted   Unstable angina ([place]) 04/08/2020   S/P laparoscopic cholecystectomy 02/28/2018   Gallstones 02/28/2018   Malnutrition of moderate degree 06/30/2015   RUQ abdominal pain    Acute cholecystitis 06/28/2015   Allergic reaction 05/26/2015     Past Medical History:  Diagnosis Date   Anemia    Anxiety    Aortic regurgitation    PT STATES SHE NEEDS VALVE REPLACMENT-SEE DR Earlyne Iba KHAN   Back pain    SCIATICA   CHF (congestive heart failure) (HCC)    COPD (chronic obstructive pulmonary disease) (HCC)    Depression    Dyspnea    DOE   Gall stones    GERD (gastroesophageal reflux disease)    Headache    migraines   History of kidney stones    h/o   Hypertension    Hypothyroidism    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   Oxygen deficit    2L/ HS   Pneumonia    PONV (postoperative nausea and vomiting)    with GA   Thyroid disease    Vertigo      Past Surgical History:  Procedure Laterality Date   CHOLECYSTECTOMY     CHOLECYSTECTOMY N/A 02/28/2018   Procedure: LAPAROSCOPIC CHOLECYSTECTOMY WITH INTRAOPERATIVE CHOLANGIOGRAM;  Surgeon: Autumn Messing  III, MD;  Location: [facility] ORS;  Service: General;  Laterality: N/A;   LEEP N/A 02/16/2019   Procedure: LOOP ELECTROSURGICAL EXCISION PROCEDURE (LEEP);  Surgeon: Harlin Heys, MD;  Location: [facility] ORS;  Service: Gynecology;  Laterality: N/A;   LEFT HEART CATH AND CORONARY ANGIOGRAPHY Left 04/14/2020   Procedure: LEFT HEART CATH AND CORONARY ANGIOGRAPHY;  Surgeon: Dionisio David, MD;  Location: [place] [facility];   Service: Cardiovascular;  Laterality: Left;    Social History   Socioeconomic History   Marital status: Divorced    Spouse name: Not on file   Number of children: Not on file   Years of education: Not on file   Highest education level: Not on file  Occupational History   Not on file  Tobacco Use   Smoking status: Every Day    Packs/day: 1.00    Years: 20.00    Pack years: 20.00    Types: Cigarettes   Smokeless tobacco: Never  Vaping Use   Vaping Use: Never used  Substance and Sexual Activity   Alcohol use: No   Drug use: No   Sexual activity: Yes    Birth control/protection: None, I.U.D.  Other Topics Concern   Not on file  Social History Narrative   Not on file   Social Determinants of Health   Financial Resource Strain: Not on file  Food Insecurity: Not on file  Transportation Needs: Not on file  Physical Activity: Not on file  Stress: Not on file  Social Connections: Not on file  Intimate Partner Violence: Not on file     Family History  Problem Relation Age of Onset   Diabetes Other    Heart failure Other    Thyroid disease Mother    Cancer Father    Hypertension Maternal Grandfather    Stroke Maternal Grandfather    Cancer Paternal Grandmother    Breast cancer Neg Hx      Current Outpatient Medications:    albuterol (PROVENTIL HFA;VENTOLIN HFA) 108 (90 BASE) MCG/ACT inhaler, Inhale 2 puffs into the lungs every 4 (four) hours as needed for wheezing or shortness of breath., Disp: , Rfl:    ALPRAZolam (XANAX) 1 MG tablet, Take 1 mg by mouth 3 (three) times daily as needed for anxiety. , Disp: , Rfl:    ascorbic acid (VITAMIN C) 500 MG tablet, Take 500 mg by mouth daily. (Patient not taking: Reported on 12/13/2020), Disp: , Rfl:    budesonide-formoterol (SYMBICORT) 160-4.5 MCG/ACT inhaler, Inhale 2 puffs into the lungs every 12 (twelve) hours., Disp: , Rfl:    buPROPion (WELLBUTRIN XL) 300 MG 24 hr tablet, Take 300 mg by mouth daily. , Disp: , Rfl: 2    carvedilol (COREG) 12.5 MG tablet, Take 12.5 mg by mouth in the morning and at bedtime. (Patient not taking: Reported on 01/06/2021), Disp: , Rfl:    Cholecalciferol (VITAMIN D3) 125 MCG (5000 UT) TABS, Take 5,000 Units by mouth daily. (Patient not taking: Reported on 12/13/2020), Disp: , Rfl:    ezetimibe (ZETIA) 10 MG tablet, Take 10 mg by mouth at bedtime.  (Patient not taking: Reported on 04/14/2020), Disp: , Rfl: 2   famotidine (PEPCID) 20 MG tablet, Take 20 mg by mouth 2 (two) times daily., Disp: , Rfl:    ferrous sulfate 325 (65 FE) MG tablet, Take 325 mg by mouth daily with breakfast., Disp: , Rfl:    folic acid (FOLVITE) 1 MG tablet, Take 1 mg by mouth daily., Disp: ,  Rfl:    furosemide (LASIX) 20 MG tablet, Take 60 mg by mouth daily. , Disp: , Rfl:    gemfibrozil (LOPID) 600 MG tablet, Take 600 mg by mouth 2 (two) times daily., Disp: , Rfl:    levocetirizine (XYZAL) 5 MG tablet, Take 5 mg by mouth every evening., Disp: , Rfl:    levothyroxine (SYNTHROID) 50 MCG tablet, Take 50 mcg by mouth daily before breakfast., Disp: , Rfl:    losartan (COZAAR) 25 MG tablet, Take 25 mg by mouth daily. , Disp: , Rfl:    meclizine (ANTIVERT) 25 MG tablet, Take 25 mg by mouth 2 (two) times daily., Disp: , Rfl: 2   nitroGLYCERIN (NITROSTAT) 0.3 MG SL tablet, Place 0.3-0.6 mg under the tongue 2 (two) times daily as needed for chest pain., Disp: , Rfl:    OXYGEN, Inhale 2 L into the lungs at bedtime., Disp: , Rfl:    Potassium Chloride ER 20 MEQ TBCR, Take 30 mEq by mouth daily., Disp: , Rfl:    QUEtiapine (SEROQUEL) 100 MG tablet, Take 100 mg by mouth at bedtime., Disp: , Rfl:    sucralfate (CARAFATE) 1 g tablet, Take 1 tablet (1 g total) by mouth 4 (four) times daily. (Patient taking differently: Take 1 g by mouth in the morning, at noon, in the evening, and at bedtime.), Disp: 60 tablet, Rfl: 0 No current facility-administered medications for this visit.  Facility-Administered Medications Ordered in Other  Visits:    sodium chloride flush (NS) 0.9 % injection 3 mL, 3 mL, Intravenous, Q12H, Dionisio David, MD   Physical exam:  Physical Exam Constitutional:      General: She is not in acute distress. Cardiovascular:     Rate and Rhythm: Normal rate and regular rhythm.     Heart sounds: Normal heart sounds.  Pulmonary:     Effort: Pulmonary effort is normal.     Breath sounds: Normal breath sounds.  Abdominal:     General: Bowel sounds are normal.     Palpations: Abdomen is soft.  Skin:    General: Skin is warm and dry.  Neurological:     Mental Status: She is alert and oriented to person, place, and time.       CMP Latest Ref Rng & Units 04/07/2020  Glucose 70 - 99 mg/dL 171(H)  BUN 6 - 20 mg/dL 31(H)  Creatinine 0.44 - 1.00 mg/dL 1.80(H)  Sodium 135 - 145 mmol/L 141  Potassium 3.5 - 5.1 mmol/L 3.8  Chloride 98 - 111 mmol/L 103  CO2 22 - 32 mmol/L 21(L)  Calcium 8.9 - 10.3 mg/dL 9.7  Total Protein 6.5 - 8.1 g/dL -  Total Bilirubin 0.3 - 1.2 mg/dL -  Alkaline Phos 38 - 126 U/L -  AST 15 - 41 U/L -  ALT 0 - 44 U/L -   CBC Latest Ref Rng & Units 04/07/2020  WBC 4.0 - 10.5 K/uL 10.0  Hemoglobin 12.0 - 15.0 g/dL 11.8(L)  Hematocrit 36.0 - 46.0 % 34.0(L)  Platelets 150 - 400 K/uL 337     Assessment and plan- Patient is a 50 y.o. female referred for anemia and thrombocytosis  On review of the outside records sent to [place] I do not see any CBC for iron studies to review.  All I see is BMP which showed an elevated creatinine of 2.  Last CBC in our system was from [place] and her H&H was 11.8/34 with a normal platelet count and a normal white  cell count.  I will proceed with complete anemia work-up including CBC ferritin iron studies B12 folate TSH reticulocyte count LDH and haptoglobin as well as myeloma panel.  In person or video visit with me in 2 weeks time.  Anemia could be possibly secondary to kidney disease as well.   Thank you for this kind referral and the opportunity to  participate in the care of this patient   Visit Diagnosis 1. Normocytic anemia     Dr. Randa Evens, MD, MPH [facility] at [facility] [id number] 04/17/2021

## 2021-04-17 NOTE — Telephone Encounter (Signed)
Called Fredrich Birks and she gave me a fax number [phone]. I faxed the results and asking for lindley, Malachy Mood to give rx to cont. Potassium and check labs for future to get potassium back up to normal. Darlene says she will let her know.

## 2021-04-18 ENCOUNTER — Inpatient Hospital Stay: Payer: Medicaid Other | Admitting: Oncology

## 2021-04-18 LAB — HAPTOGLOBIN: Haptoglobin: 312 mg/dL — ABNORMAL HIGH (ref 42–296)

## 2021-04-21 LAB — MULTIPLE MYELOMA PANEL, SERUM
Albumin SerPl Elph-Mcnc: 3.4 g/dL (ref 2.9–4.4)
Albumin/Glob SerPl: 1 (ref 0.7–1.7)
Alpha 1: 0.3 g/dL (ref 0.0–0.4)
Alpha2 Glob SerPl Elph-Mcnc: 1 g/dL (ref 0.4–1.0)
B-Globulin SerPl Elph-Mcnc: 1.3 g/dL (ref 0.7–1.3)
Gamma Glob SerPl Elph-Mcnc: 0.9 g/dL (ref 0.4–1.8)
Globulin, Total: 3.6 g/dL (ref 2.2–3.9)
IgA: 383 mg/dL — ABNORMAL HIGH (ref 87–352)
IgG (Immunoglobin G), Serum: 881 mg/dL (ref 586–1602)
IgM (Immunoglobulin M), Srm: 77 mg/dL (ref 26–217)
Total Protein ELP: 7 g/dL (ref 6.0–8.5)

## 2021-04-24 ENCOUNTER — Inpatient Hospital Stay (HOSPITAL_BASED_OUTPATIENT_CLINIC_OR_DEPARTMENT_OTHER): Payer: Medicaid Other | Admitting: Oncology

## 2021-04-24 ENCOUNTER — Other Ambulatory Visit: Payer: Self-pay

## 2021-04-24 ENCOUNTER — Encounter: Payer: Self-pay | Admitting: Oncology

## 2021-04-24 DIAGNOSIS — N183 Chronic kidney disease, stage 3 unspecified: Secondary | ICD-10-CM

## 2021-04-24 DIAGNOSIS — D631 Anemia in chronic kidney disease: Secondary | ICD-10-CM | POA: Diagnosis not present

## 2021-04-24 DIAGNOSIS — D649 Anemia, unspecified: Secondary | ICD-10-CM | POA: Diagnosis not present

## 2021-04-24 NOTE — Progress Notes (Signed)
I connected with Grace Fox on 04/24/21 at  8:30 AM EDT by video enabled telemedicine visit and verified that I am speaking with the correct person using two identifiers.   I discussed the limitations, risks, security and privacy concerns of performing an evaluation and management service by telemedicine and the availability of in-person appointments. I also discussed with the patient that there may be a patient responsible charge related to this service. The patient expressed understanding and agreed to proceed.  Other persons participating in the visit and their role in the encounter:  none  Patient's location:  home Provider's location:  work  Risk analyst Complaint: Discuss results of blood work  History of present illness: Patient is a 50 year old female with a past medical history significant for hypothyroidism hypertension CHF COPD and other medical problems.  She has been referred for anemia and thrombocytosis.  Most recent blood work from 03/01/2021 showed mildly elevated alkaline phosphatase of 169.  Calcium low at 8.5 and serum creatinine elevated at 2.  I do not see any results of CBC in the scanned lab records.  Results of blood work from 04/17/2021 were as follows:CBC showed white cell count of 8, H&H of 10.8/32.6 and a platelet count of 317.  CMP showed a low potassium of 2.7, creatinine of 1.86.  Serum calcium normal at 9.1 with a total protein normal at 7.8.  B12 folate normal.  Ferritin levels normal at 274.  TIBC normal with an iron saturation of 15%.  Myeloma panel showed no M protein.  Haptoglobin normal TSH normal.  Reticulocyte count mildly low for the degree of anemia 2.1%.  Interval history: Mainly reports fatigue which is chronic.  No new complaints at this time   Review of Systems  Constitutional:  Positive for malaise/fatigue. Negative for chills, fever and weight loss.  HENT:  Negative for congestion, ear discharge and nosebleeds.   Eyes:  Negative for blurred vision.   Respiratory:  Negative for cough, hemoptysis, sputum production, shortness of breath and wheezing.   Cardiovascular:  Negative for chest pain, palpitations, orthopnea and claudication.  Gastrointestinal:  Negative for abdominal pain, blood in stool, constipation, diarrhea, heartburn, melena, nausea and vomiting.  Genitourinary:  Negative for dysuria, flank pain, frequency, hematuria and urgency.  Musculoskeletal:  Negative for back pain, joint pain and myalgias.  Skin:  Negative for rash.  Neurological:  Negative for dizziness, tingling, focal weakness, seizures, weakness and headaches.  Endo/Heme/Allergies:  Does not bruise/bleed easily.  Psychiatric/Behavioral:  Negative for depression and suicidal ideas. The patient does not have insomnia.    Allergies  Allergen Reactions   Lactose Intolerance (Gi) Other (See Comments)    Gi upset   Other Other (See Comments)    Grass- stuffy nose/itching/sneezing Pollen- stuffy nose/itching/sneezing    Past Medical History:  Diagnosis Date   Anemia    Anxiety    Aortic regurgitation    PT STATES SHE NEEDS VALVE REPLACMENT-SEE DR Earlyne Iba KHAN   Back pain    SCIATICA   CHF (congestive heart failure) (HCC)    COPD (chronic obstructive pulmonary disease) (HCC)    Depression    Dyspnea    DOE   Gall stones    GERD (gastroesophageal reflux disease)    Headache    migraines   History of kidney stones    h/o   Hypertension    Hypothyroidism    Kidney atrophy    some damage to due to one kidney due to [facility]   Oxygen deficit  2L/ HS   Pneumonia    PONV (postoperative nausea and vomiting)    with GA   Thyroid disease    Vertigo     Past Surgical History:  Procedure Laterality Date   CHOLECYSTECTOMY     CHOLECYSTECTOMY N/A 02/28/2018   Procedure: LAPAROSCOPIC CHOLECYSTECTOMY WITH INTRAOPERATIVE CHOLANGIOGRAM;  Surgeon: Jovita Kussmaul, MD;  Location: [facility] ORS;  Service: General;  Laterality: N/A;   LEEP N/A 02/16/2019   Procedure:  LOOP ELECTROSURGICAL EXCISION PROCEDURE (LEEP);  Surgeon: Harlin Heys, MD;  Location: [facility] OR[facility];  Service: Gynecology;  Laterality: N/A;   LEFT HEART CATH AND CORONARY ANGIOGRAPHY Left 04/14/2020   Procedure: LEFT HEART CATH AND CORONARY ANGIOGRAPHY;  Surgeon: Dionisio David, MD;  Location: [place] [facility];  Service: Cardiovascular;  Laterality: Left;    Social History   Socioeconomic History   Marital status: Divorced    Spouse name: Not on file   Number of children: Not on file   Years of education: Not on file   Highest education level: Not on file  Occupational History   Not on file  Tobacco Use   Smoking status: Former    Packs/day: 1.00    Years: 20.00    Pack years: 20.00    Types: Cigarettes    Quit date: 02/06/2021    Years since quitting: 0.2   Smokeless tobacco: Never  Vaping Use   Vaping Use: Never used  Substance and Sexual Activity   Alcohol use: No   Drug use: No   Sexual activity: Yes    Birth control/protection: None, I.U.D.  Other Topics Concern   Not on file  Social History Narrative   Not on file   Social Determinants of Health   Financial Resource Strain: Not on file  Food Insecurity: Not on file  Transportation Needs: Not on file  Physical Activity: Not on file  Stress: Not on file  Social Connections: Not on file  Intimate Partner Violence: Not on file    Family History  Problem Relation Age of Onset   Diabetes Mother    Thyroid disease Mother    Heart failure Mother    Diabetes Father    Heart failure Father    Lung cancer Father    Heart failure Brother    Diabetes Maternal Grandmother    Emphysema Maternal Grandmother    Hypertension Maternal Grandfather    Stroke Maternal Grandfather    Heart attack Maternal Grandfather    Emphysema Paternal Grandmother    Cancer Paternal Grandmother    Diabetes Other    Heart failure Other    Breast cancer Neg Hx      Current Outpatient Medications:    albuterol (PROVENTIL  HFA;VENTOLIN HFA) 108 (90 BASE) MCG/ACT inhaler, Inhale 2 puffs into the lungs every 4 (four) hours as needed for wheezing or shortness of breath., Disp: , Rfl:    ALPRAZolam (XANAX) 1 MG tablet, Take 1 mg by mouth 3 (three) times daily as needed for anxiety. , Disp: , Rfl:    Bempedoic Acid-Ezetimibe (NEXLIZET) 180-10 MG TABS, Take 1 tablet by mouth daily., Disp: , Rfl:    budesonide-formoterol (SYMBICORT) 160-4.5 MCG/ACT inhaler, Inhale 2 puffs into the lungs every 12 (twelve) hours., Disp: , Rfl:    buPROPion (WELLBUTRIN XL) 300 MG 24 hr tablet, Take 300 mg by mouth daily. , Disp: , Rfl: 2   Cetirizine HCl (ZYRTEC ALLERGY) 10 MG CAPS, Take 10 mg by mouth daily., Disp: , Rfl:  Cyanocobalamin 1000 MCG/ML KIT, Inject 1 Dose as directed every 30 (thirty) days. AT HOME, Disp: , Rfl:    dapagliflozin propanediol (FARXIGA) 10 MG TABS tablet, Take by mouth daily., Disp: , Rfl:    ezetimibe (ZETIA) 10 MG tablet, Take 10 mg by mouth daily., Disp: , Rfl:    famotidine (PEPCID) 20 MG tablet, Take 20 mg by mouth 2 (two) times daily., Disp: , Rfl:    ferrous sulfate 325 (65 FE) MG tablet, Take 325 mg by mouth daily with breakfast., Disp: , Rfl:    folic acid (FOLVITE) 1 MG tablet, Take 1 mg by mouth daily., Disp: , Rfl:    furosemide (LASIX) 20 MG tablet, Take 40 mg by mouth daily., Disp: , Rfl:    gemfibrozil (LOPID) 600 MG tablet, Take 600 mg by mouth 2 (two) times daily., Disp: , Rfl:    levothyroxine (SYNTHROID) 50 MCG tablet, Take 50 mcg by mouth daily before breakfast., Disp: , Rfl:    losartan (COZAAR) 25 MG tablet, Take 12.5 mg by mouth daily., Disp: , Rfl:    meclizine (ANTIVERT) 25 MG tablet, Take 25 mg by mouth 2 (two) times daily., Disp: , Rfl: 2   metoprolol tartrate (LOPRESSOR) 50 MG tablet, Take 50 mg by mouth 2 (two) times daily., Disp: , Rfl:    nitroGLYCERIN (NITROSTAT) 0.3 MG SL tablet, Place 0.3-0.6 mg under the tongue 2 (two) times daily as needed for chest pain., Disp: , Rfl:     OXYGEN, Inhale 2 L into the lungs at bedtime., Disp: , Rfl:    Potassium Chloride ER 20 MEQ TBCR, Take 20 mEq by mouth daily., Disp: , Rfl:    QUEtiapine (SEROQUEL) 50 MG tablet, Take 50 mg by mouth at bedtime., Disp: , Rfl:    rosuvastatin (CRESTOR) 40 MG tablet, Take 40 mg by mouth daily., Disp: , Rfl:    sucralfate (CARAFATE) 1 g tablet, Take 1 tablet (1 g total) by mouth 4 (four) times daily. (Patient taking differently: Take 1 g by mouth in the morning, at noon, in the evening, and at bedtime.), Disp: 60 tablet, Rfl: 0 No current facility-administered medications for this visit.  Facility-Administered Medications Ordered in Other Visits:    sodium chloride flush (NS) 0.9 % injection 3 mL, 3 mL, Intravenous, Q12H, Dionisio David, MD  No results found.  No images are attached to the encounter.   CMP Latest Ref Rng & Units 04/17/2021  Glucose 70 - 99 mg/dL 132(H)  BUN 6 - 20 mg/dL 23(H)  Creatinine 0.44 - 1.00 mg/dL 1.86(H)  Sodium 135 - 145 mmol/L 134(L)  Potassium 3.5 - 5.1 mmol/L 2.7(LL)  Chloride 98 - 111 mmol/L 100  CO2 22 - 32 mmol/L 25  Calcium 8.9 - 10.3 mg/dL 9.1  Total Protein 6.5 - 8.1 g/dL 7.8  Total Bilirubin 0.3 - 1.2 mg/dL 0.2(L)  Alkaline Phos 38 - 126 U/L 120  AST 15 - 41 U/L 25  ALT 0 - 44 U/L 25   CBC Latest Ref Rng & Units 04/17/2021  WBC 4.0 - 10.5 K/uL 8.0  Hemoglobin 12.0 - 15.0 g/dL 10.8(L)  Hematocrit 36.0 - 46.0 % 32.6(L)  Platelets 150 - 400 K/uL 317     Observation/objective: Appears in no acute distress over video visit today.  Breathing is nonlabored  Assessment and plan: Patient is a 50 year old female referred for normocytic anemia  Discussed the results of blood work with the patient which was consistent with anemia of chronic kidney  disease.  Ferritin levels are more than 200.  Iron saturation 15% with a normal TIBC.  I will hold off on giving her any IV iron at this time given that her ferritin is greater than 100.  B12 folate TSH  haptoglobin are all normal.  I therefore suspect that her anemia secondary to chronic kidney disease.  Given that her hemoglobin is greater than 10 there would be no need to initiate EPO just yet.  Discussed that Depo injections can be given every 3 weeks to maintain hemoglobin between 10-11 for patients with chronic kidney disease.  Discussed risks and benefits of April including all but not limited to possible risk of thromboembolic events when hemoglobin is increased to greater than 11.  I will consider initiating EPO when her hemoglobin consistently drifts down to less than 10.  For now I am inclined to monitor her anemia conservatively.   Hypokalemia: We did inform her primary care provider Threasa Alpha about it and she will follow-up.  I also encouraged the patient to get in touch with Dr. Percell Miller from nephrology about her chronic hypokalemia.  Patient is currently on oral potassium once a day   Follow-up instructions: CBC ferritin and iron studies in 3 in 6 months and I will see her in 6 months  I discussed the assessment and treatment plan with the patient. The patient was provided an opportunity to ask questions and all were answered. The patient agreed with the plan and demonstrated an understanding of the instructions.   The patient was advised to call back or seek an in-person evaluation if the symptoms worsen or if the condition fails to improve as anticipated.    Visit Diagnosis: 1. Normocytic anemia   2. Anemia of chronic kidney failure, stage 3 (moderate) (HCC)     Dr. Randa Evens, MD, MPH [facility] at [facility] Tel- [phone] 04/24/2021 8:44 AM

## 2021-05-24 IMAGING — MG DIGITAL DIAGNOSTIC BILATERAL MAMMOGRAM WITH TOMO AND CAD
8 series · 8 of 24 positions shown · non-contrast
Comparison: Previous exam(s).

CLINICAL DATA: 47-year-old patient presents for follow-up of
probable fibroadenoma right breast and annual examination of both
breasts. On evaluation in September 2017, the probable fibroadenoma in
the upper inner quadrant of the right breast was described as being
not significantly changed in size or appearance mammographically
compared to prior outside mammogram June 2015.

EXAM:
DIGITAL DIAGNOSTIC BILATERAL MAMMOGRAM WITH CAD AND TOMO
ULTRASOUND RIGHT BREAST

[L MLO synth-2D]
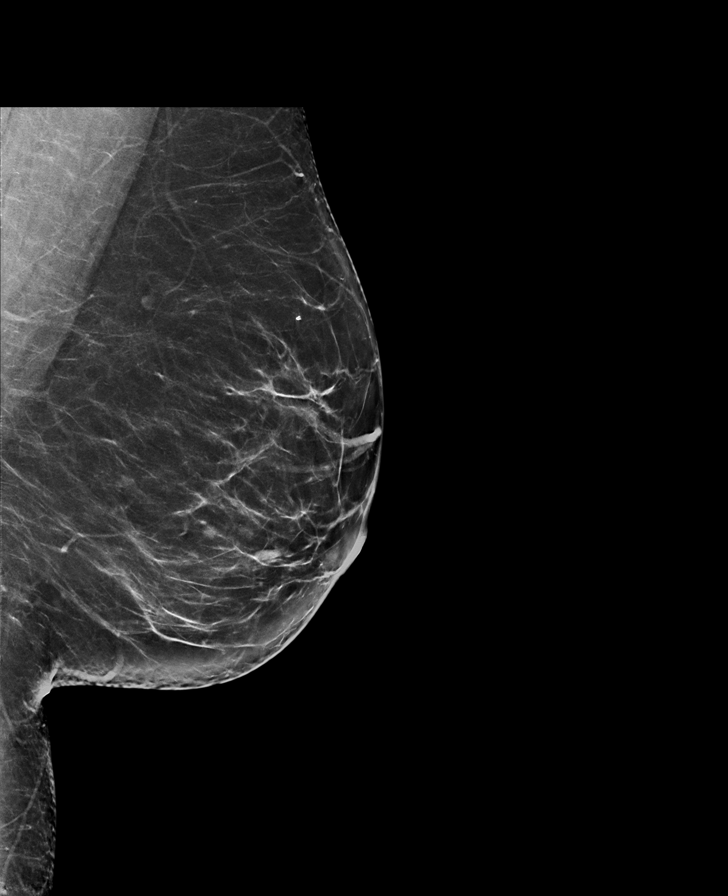

[L CC synth-2D]
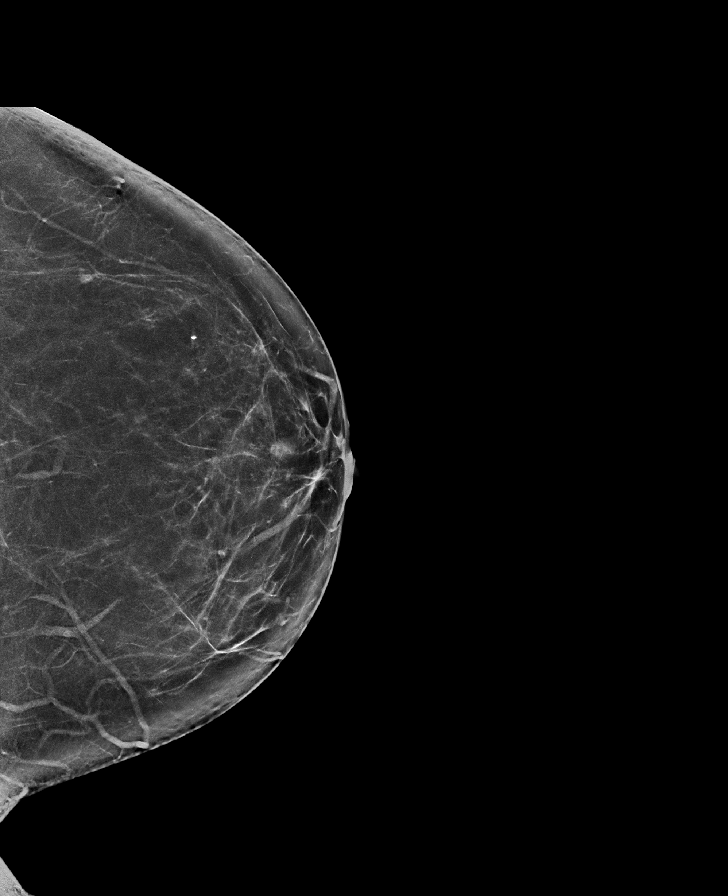

[R CC synth-2D]
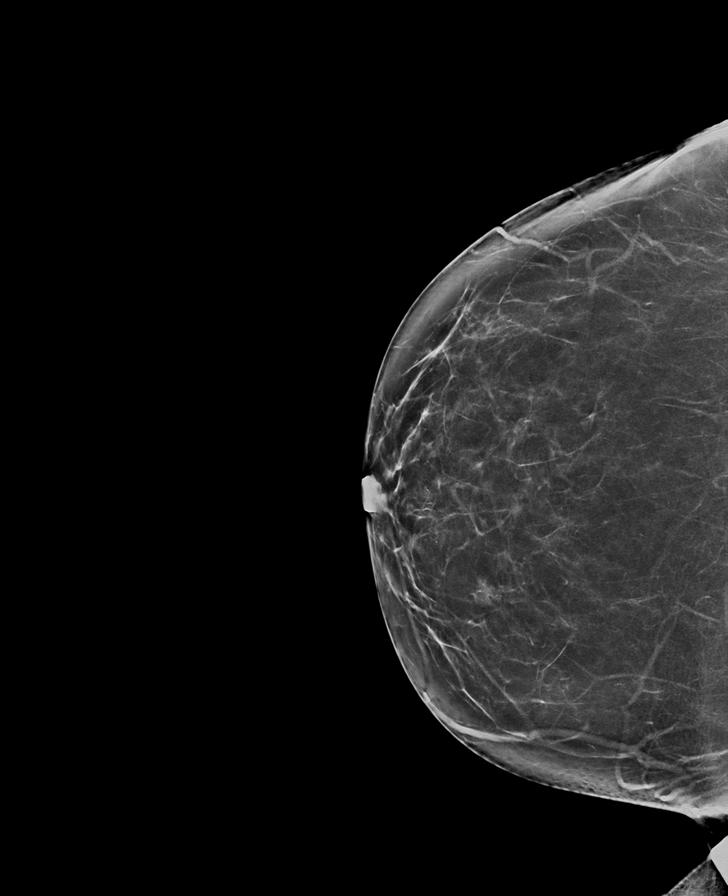

[R MLO synth-2D]
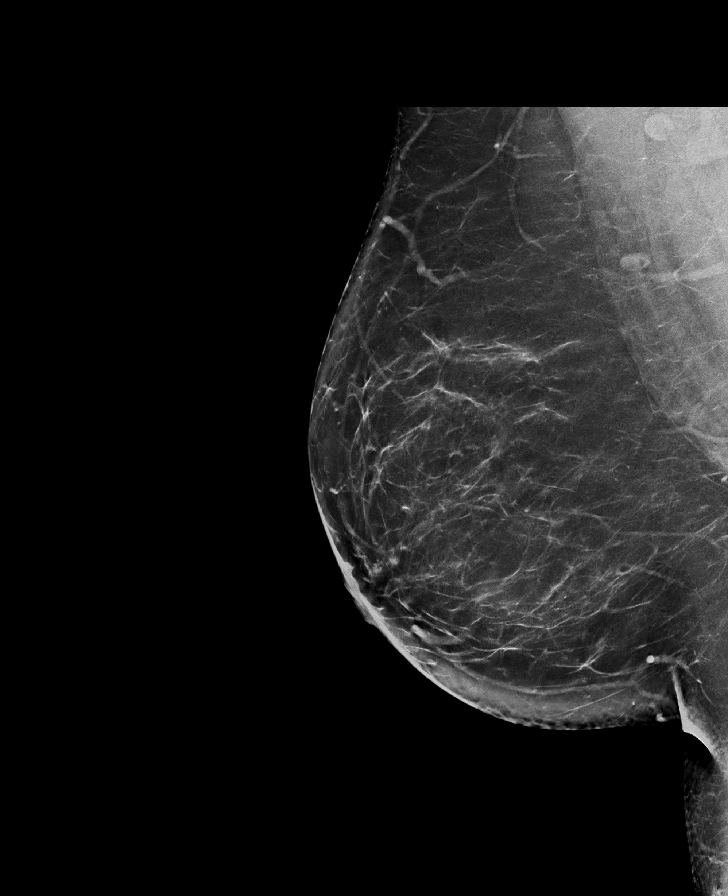

[L CC tomo · tomo slice 39/77.0]
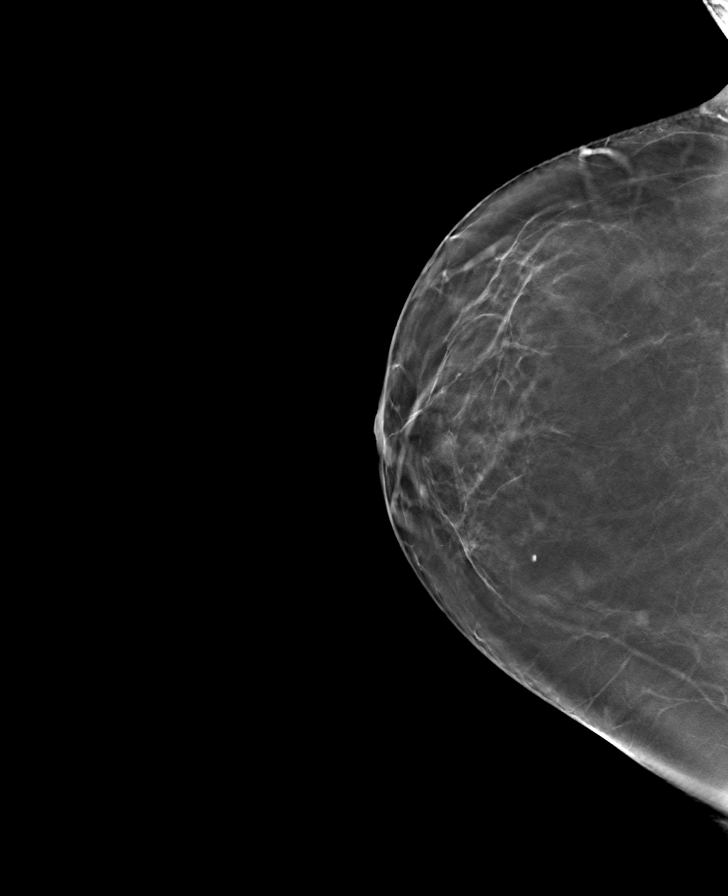

[R CC tomo · tomo slice 39/77.0]
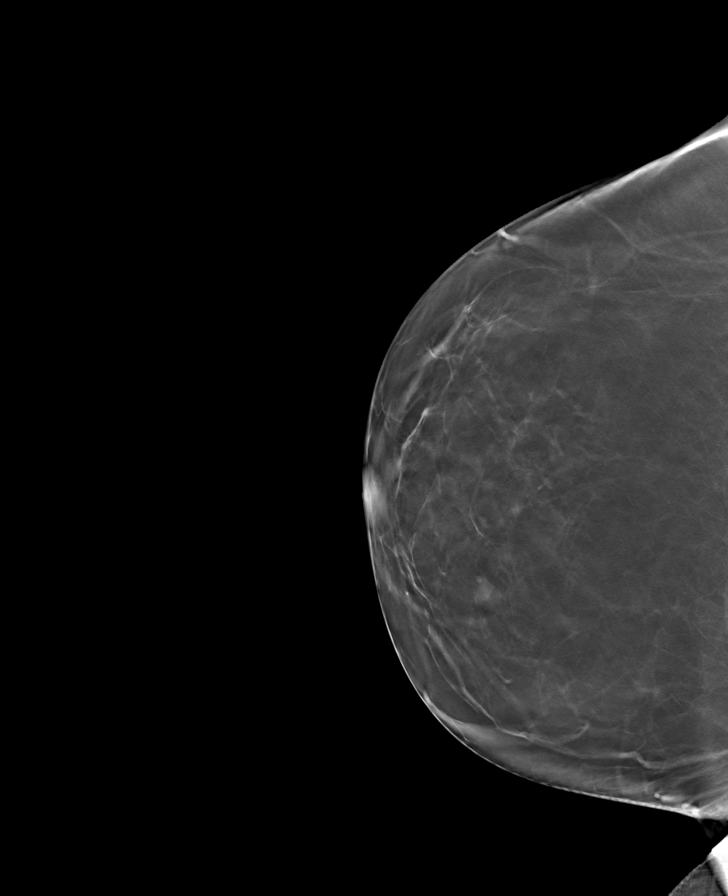

[R MLO tomo · tomo slice 45/88.0]
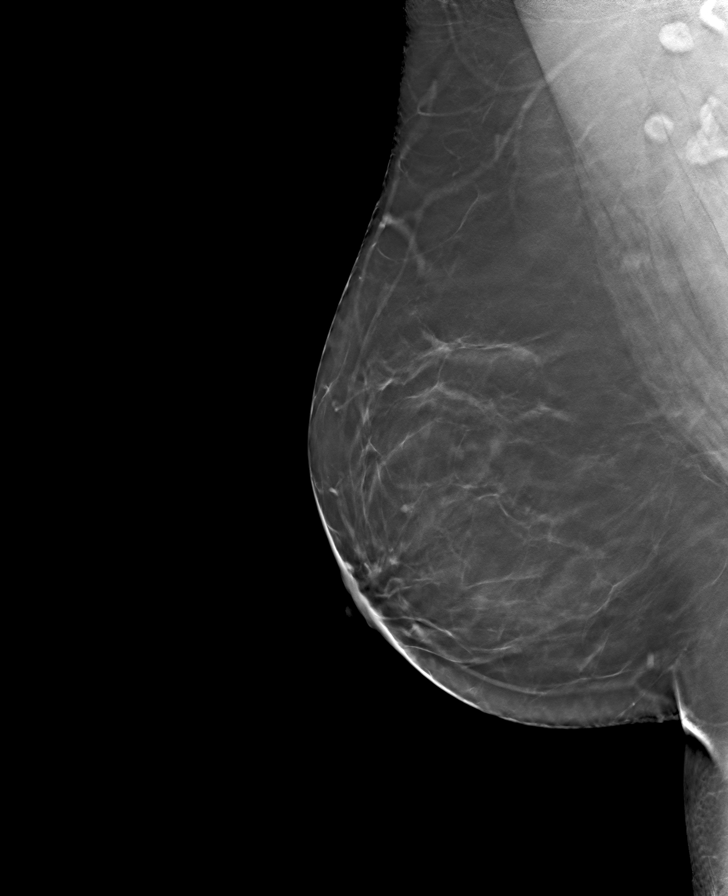

[L MLO tomo · tomo slice 41/80.0]
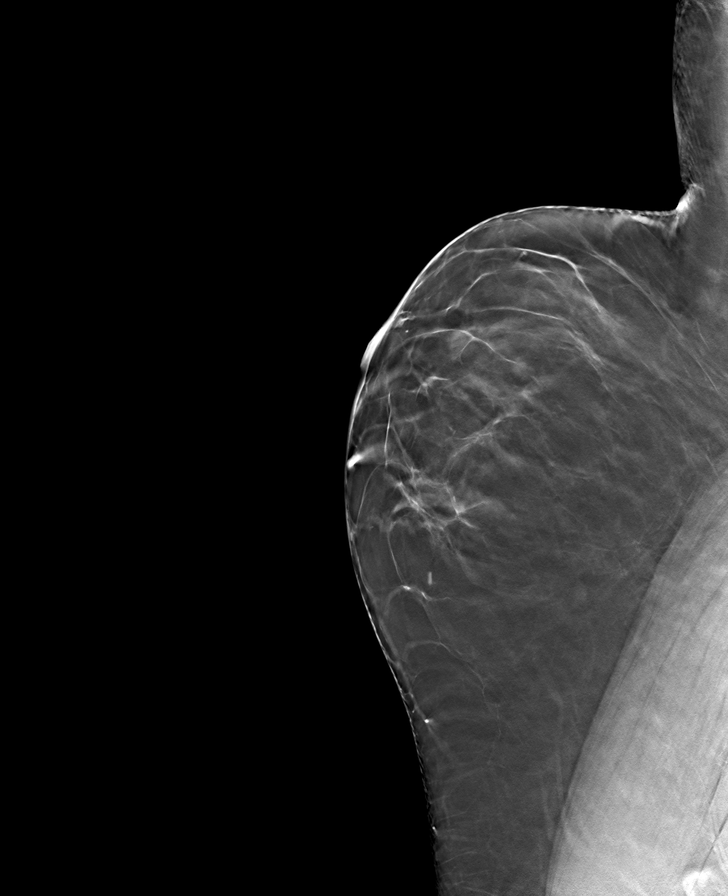

[8 of 24 positions shown; findings below may reference images not displayed]

ACR Breast Density Category b: There are scattered areas of
fibroglandular density.
FINDINGS: Stable retroareolar left breast mass, previously evaluated with
ultrasound in September 2017, with benign findings.

Mammographically stable circumscribed mass with gentle lobulation in
the upper inner quadrant of the right breast. No new or suspicious
mass, architectural distortion or suspicious microcalcification in
either breast.

Mammographic images were processed with CAD.

Targeted ultrasound is performed, showing an oval circumscribed
gently lobulated and parallel mass at 1 o'clock position right
breast 5 cm from nipple measuring 8 x 3 x 8 mm, stable. This mass
continues to have features suggestive of a benign fibroadenoma.
IMPRESSION: Stable probable fibroadenoma right breast. No suspicious findings in
either breast. The patient can return to screening mammography.

RECOMMENDATION:
Screening mammogram in one year.(Code:IA-S-FBK)

I have discussed the findings and recommendations with the patient.
Results were also provided in writing at the conclusion of the
visit. If applicable, a reminder letter will be sent to the patient
regarding the next appointment.

BI-RADS CATEGORY  2: Benign.

## 2021-05-24 IMAGING — US ULTRASOUND RIGHT BREAST LIMITED
1 series · 5 of 5 positions shown · non-contrast
Comparison: Previous exam(s).

CLINICAL DATA: 47-year-old patient presents for follow-up of
probable fibroadenoma right breast and annual examination of both
breasts. On evaluation in September 2017, the probable fibroadenoma in
the upper inner quadrant of the right breast was described as being
not significantly changed in size or appearance mammographically
compared to prior outside mammogram June 2015.

EXAM:
DIGITAL DIAGNOSTIC BILATERAL MAMMOGRAM WITH CAD AND TOMO
ULTRASOUND RIGHT BREAST

[Series 1: ultrasound right breast limited · 0.05mm/px · 5 of 5 slices shown]
[im 1/5]
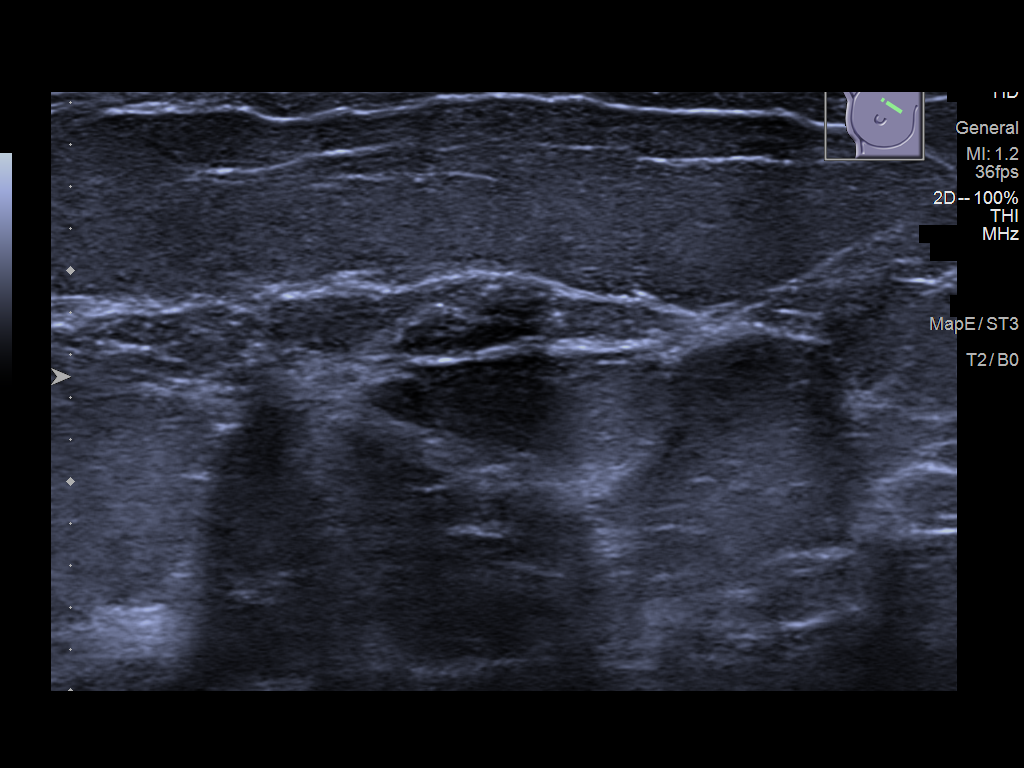
[im 2/5]
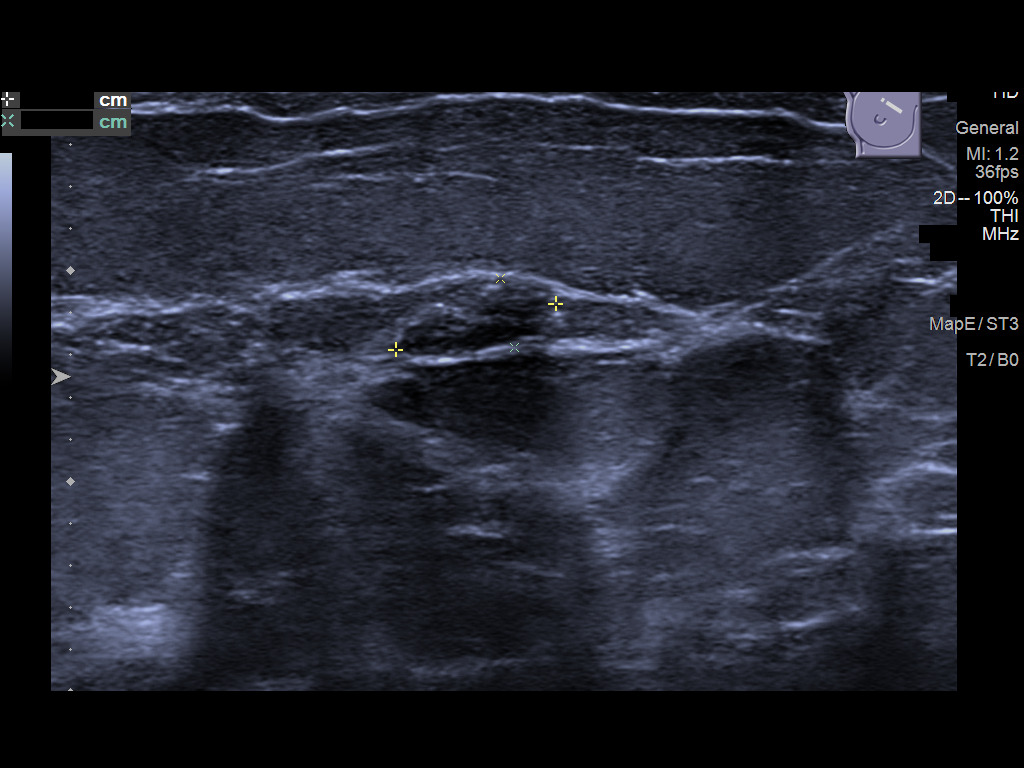
[im 3/5]
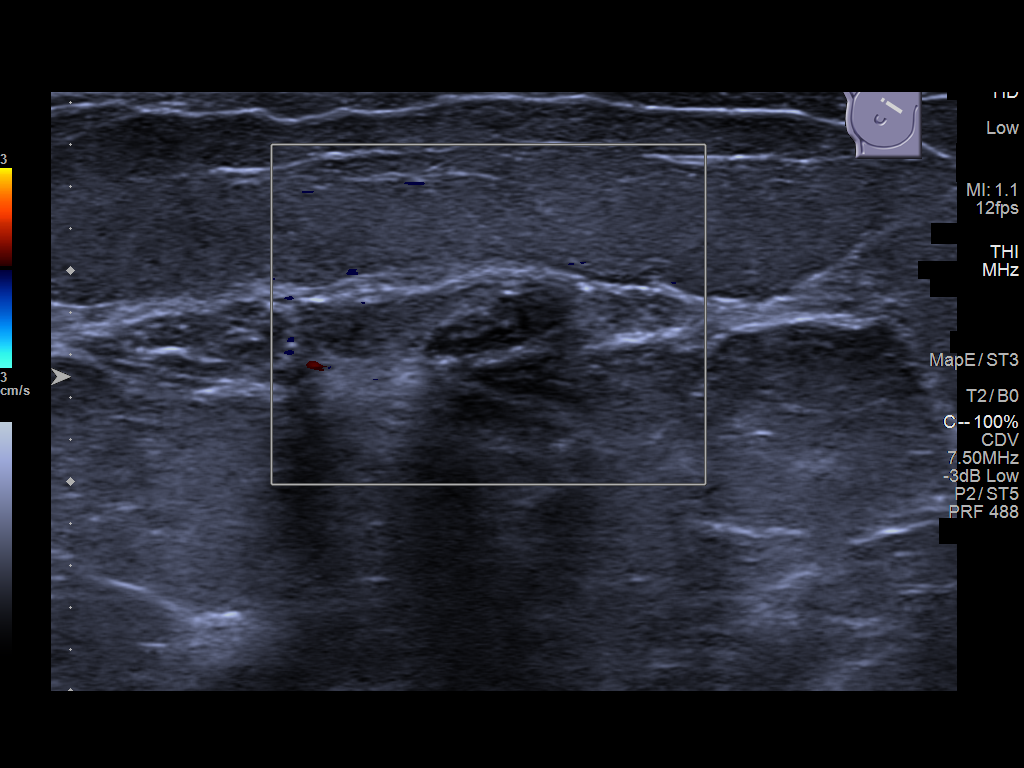
[im 4/5]
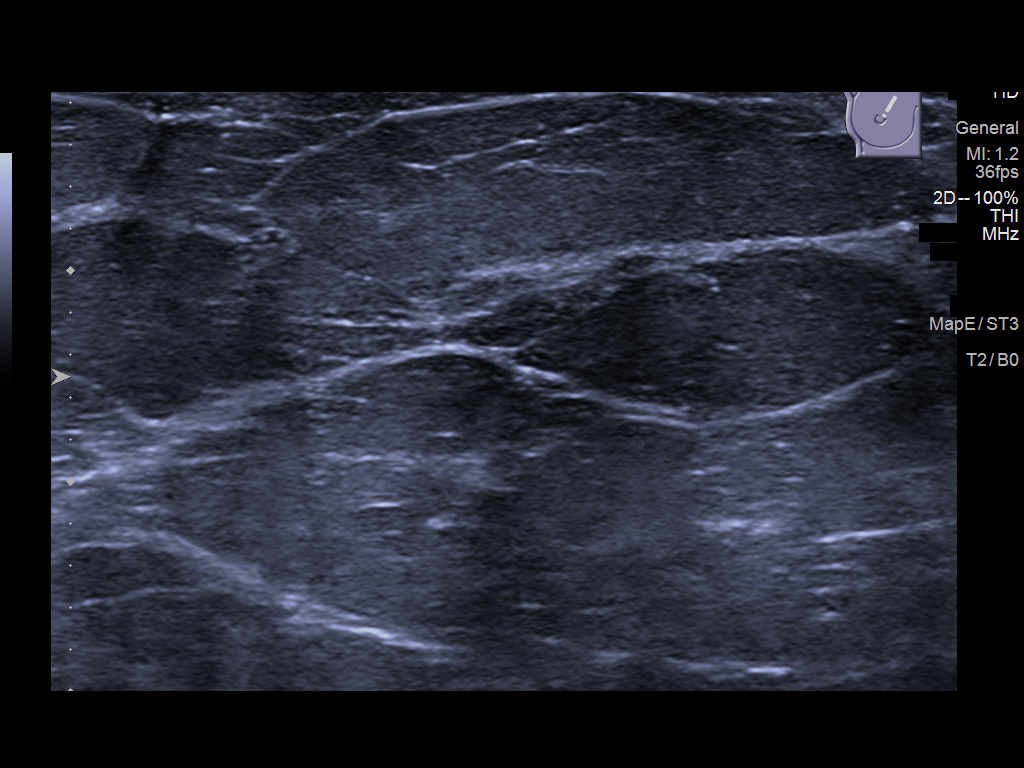
[im 5/5]
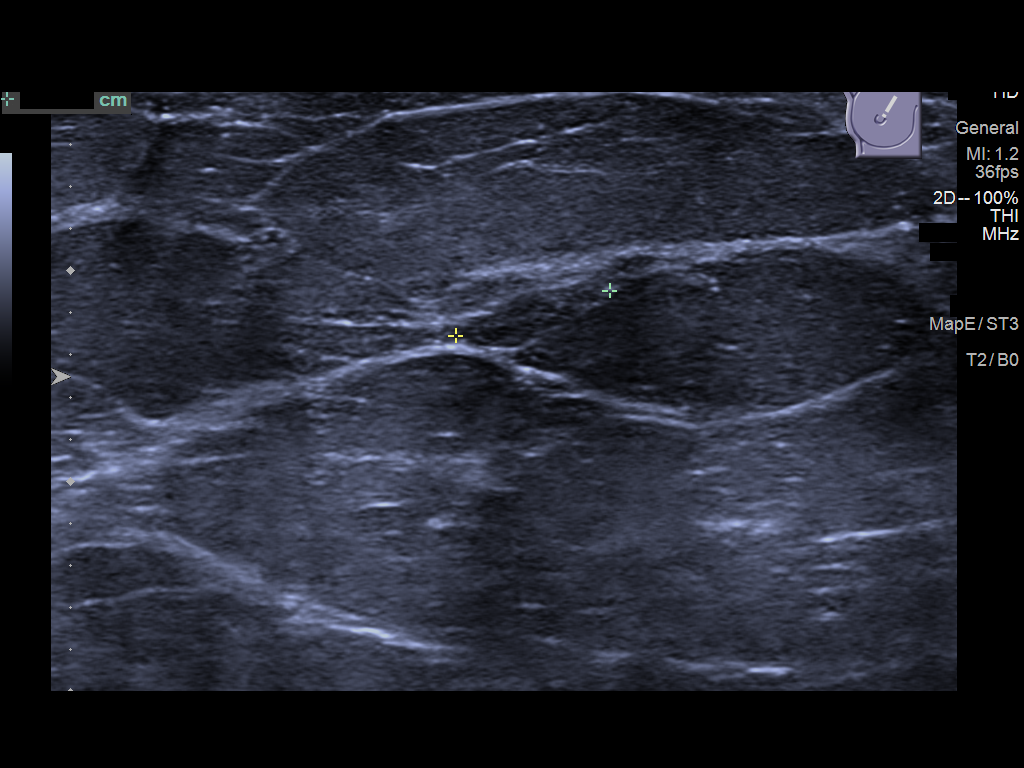

[5 of 5 positions shown; findings below may reference images not displayed]

ACR Breast Density Category b: There are scattered areas of
fibroglandular density.
FINDINGS: Stable retroareolar left breast mass, previously evaluated with
ultrasound in September 2017, with benign findings.

Mammographically stable circumscribed mass with gentle lobulation in
the upper inner quadrant of the right breast. No new or suspicious
mass, architectural distortion or suspicious microcalcification in
either breast.

Mammographic images were processed with CAD.

Targeted ultrasound is performed, showing an oval circumscribed
gently lobulated and parallel mass at 1 o'clock position right
breast 5 cm from nipple measuring 8 x 3 x 8 mm, stable. This mass
continues to have features suggestive of a benign fibroadenoma.
IMPRESSION: Stable probable fibroadenoma right breast. No suspicious findings in
either breast. The patient can return to screening mammography.

RECOMMENDATION:
Screening mammogram in one year.(Code:IA-S-FBK)

I have discussed the findings and recommendations with the patient.
Results were also provided in writing at the conclusion of the
visit. If applicable, a reminder letter will be sent to the patient
regarding the next appointment.

BI-RADS CATEGORY  2: Benign.

## 2021-07-20 ENCOUNTER — Other Ambulatory Visit: Payer: Self-pay | Admitting: *Deleted

## 2021-07-20 DIAGNOSIS — D649 Anemia, unspecified: Secondary | ICD-10-CM

## 2021-07-20 DIAGNOSIS — D631 Anemia in chronic kidney disease: Secondary | ICD-10-CM

## 2021-07-25 ENCOUNTER — Inpatient Hospital Stay: Payer: Medicaid Other | Attending: Oncology

## 2021-07-25 ENCOUNTER — Other Ambulatory Visit: Payer: Self-pay

## 2021-07-25 DIAGNOSIS — D649 Anemia, unspecified: Secondary | ICD-10-CM | POA: Insufficient documentation

## 2021-07-25 DIAGNOSIS — E876 Hypokalemia: Secondary | ICD-10-CM | POA: Diagnosis not present

## 2021-07-25 DIAGNOSIS — D631 Anemia in chronic kidney disease: Secondary | ICD-10-CM

## 2021-07-25 DIAGNOSIS — N183 Chronic kidney disease, stage 3 unspecified: Secondary | ICD-10-CM

## 2021-07-25 LAB — CBC WITH DIFFERENTIAL/PLATELET
Abs Immature Granulocytes: 0.09 10*3/uL — ABNORMAL HIGH (ref 0.00–0.07)
Basophils Absolute: 0.1 10*3/uL (ref 0.0–0.1)
Basophils Relative: 1 %
Eosinophils Absolute: 0.2 10*3/uL (ref 0.0–0.5)
Eosinophils Relative: 2 %
HCT: 30.5 % — ABNORMAL LOW (ref 36.0–46.0)
Hemoglobin: 10.1 g/dL — ABNORMAL LOW (ref 12.0–15.0)
Immature Granulocytes: 1 %
Lymphocytes Relative: 24 %
Lymphs Abs: 2.4 10*3/uL (ref 0.7–4.0)
MCH: 30.1 pg (ref 26.0–34.0)
MCHC: 33.1 g/dL (ref 30.0–36.0)
MCV: 91 fL (ref 80.0–100.0)
Monocytes Absolute: 0.6 10*3/uL (ref 0.1–1.0)
Monocytes Relative: 6 %
Neutro Abs: 6.3 10*3/uL (ref 1.7–7.7)
Neutrophils Relative %: 66 %
Platelets: 338 10*3/uL (ref 150–400)
RBC: 3.35 MIL/uL — ABNORMAL LOW (ref 3.87–5.11)
RDW: 13.2 % (ref 11.5–15.5)
WBC: 9.7 10*3/uL (ref 4.0–10.5)
nRBC: 0 % (ref 0.0–0.2)

## 2021-07-25 LAB — IRON AND TIBC
Iron: 63 ug/dL (ref 28–170)
Saturation Ratios: 17 % (ref 10.4–31.8)
TIBC: 374 ug/dL (ref 250–450)
UIBC: 311 ug/dL

## 2021-07-25 LAB — FERRITIN: Ferritin: 281 ng/mL (ref 11–307)

## 2021-07-27 ENCOUNTER — Encounter: Payer: Self-pay | Admitting: Oncology

## 2021-07-28 ENCOUNTER — Other Ambulatory Visit: Payer: Self-pay | Admitting: *Deleted

## 2021-07-28 DIAGNOSIS — D631 Anemia in chronic kidney disease: Secondary | ICD-10-CM

## 2021-07-28 DIAGNOSIS — D649 Anemia, unspecified: Secondary | ICD-10-CM

## 2021-07-28 NOTE — Telephone Encounter (Signed)
The labs were added on

## 2021-10-23 ENCOUNTER — Inpatient Hospital Stay (HOSPITAL_BASED_OUTPATIENT_CLINIC_OR_DEPARTMENT_OTHER): Payer: Medicaid Other | Admitting: Oncology

## 2021-10-23 ENCOUNTER — Encounter: Payer: Self-pay | Admitting: Oncology

## 2021-10-23 ENCOUNTER — Inpatient Hospital Stay: Payer: Medicaid Other | Attending: Oncology

## 2021-10-23 VITALS — BP 121/56 | HR 83 | Temp 96.4°F | Resp 18 | Wt 244.8 lb

## 2021-10-23 DIAGNOSIS — Z87891 Personal history of nicotine dependence: Secondary | ICD-10-CM | POA: Diagnosis not present

## 2021-10-23 DIAGNOSIS — D649 Anemia, unspecified: Secondary | ICD-10-CM

## 2021-10-23 DIAGNOSIS — I130 Hypertensive heart and chronic kidney disease with heart failure and stage 1 through stage 4 chronic kidney disease, or unspecified chronic kidney disease: Secondary | ICD-10-CM | POA: Diagnosis not present

## 2021-10-23 DIAGNOSIS — Z801 Family history of malignant neoplasm of trachea, bronchus and lung: Secondary | ICD-10-CM | POA: Diagnosis not present

## 2021-10-23 DIAGNOSIS — N183 Chronic kidney disease, stage 3 unspecified: Secondary | ICD-10-CM | POA: Insufficient documentation

## 2021-10-23 DIAGNOSIS — D631 Anemia in chronic kidney disease: Secondary | ICD-10-CM | POA: Diagnosis not present

## 2021-10-23 DIAGNOSIS — I509 Heart failure, unspecified: Secondary | ICD-10-CM | POA: Diagnosis not present

## 2021-10-23 LAB — IRON AND TIBC
Iron: 67 ug/dL (ref 28–170)
Saturation Ratios: 16 % (ref 10.4–31.8)
TIBC: 409 ug/dL (ref 250–450)
UIBC: 342 ug/dL

## 2021-10-23 LAB — CBC WITH DIFFERENTIAL/PLATELET
Abs Immature Granulocytes: 0.05 10*3/uL (ref 0.00–0.07)
Basophils Absolute: 0 10*3/uL (ref 0.0–0.1)
Basophils Relative: 1 %
Eosinophils Absolute: 0.2 10*3/uL (ref 0.0–0.5)
Eosinophils Relative: 2 %
HCT: 33.2 % — ABNORMAL LOW (ref 36.0–46.0)
Hemoglobin: 10.9 g/dL — ABNORMAL LOW (ref 12.0–15.0)
Immature Granulocytes: 1 %
Lymphocytes Relative: 29 %
Lymphs Abs: 2 10*3/uL (ref 0.7–4.0)
MCH: 30.4 pg (ref 26.0–34.0)
MCHC: 32.8 g/dL (ref 30.0–36.0)
MCV: 92.7 fL (ref 80.0–100.0)
Monocytes Absolute: 0.5 10*3/uL (ref 0.1–1.0)
Monocytes Relative: 7 %
Neutro Abs: 4.3 10*3/uL (ref 1.7–7.7)
Neutrophils Relative %: 60 %
Platelets: 232 10*3/uL (ref 150–400)
RBC: 3.58 MIL/uL — ABNORMAL LOW (ref 3.87–5.11)
RDW: 13.3 % (ref 11.5–15.5)
WBC: 7.1 10*3/uL (ref 4.0–10.5)
nRBC: 0 % (ref 0.0–0.2)

## 2021-10-23 LAB — COMPREHENSIVE METABOLIC PANEL
ALT: 12 U/L (ref 0–44)
AST: 16 U/L (ref 15–41)
Albumin: 3.4 g/dL — ABNORMAL LOW (ref 3.5–5.0)
Alkaline Phosphatase: 93 U/L (ref 38–126)
Anion gap: 6 (ref 5–15)
BUN: 20 mg/dL (ref 6–20)
CO2: 25 mmol/L (ref 22–32)
Calcium: 9.2 mg/dL (ref 8.9–10.3)
Chloride: 106 mmol/L (ref 98–111)
Creatinine, Ser: 1.12 mg/dL — ABNORMAL HIGH (ref 0.44–1.00)
GFR, Estimated: >60 mL/min (ref >60)
Glucose, Bld: 206 mg/dL — ABNORMAL HIGH (ref 70–99)
Potassium: 3.2 mmol/L — ABNORMAL LOW (ref 3.5–5.1)
Sodium: 137 mmol/L (ref 135–145)
Total Bilirubin: 0.3 mg/dL (ref 0.3–1.2)
Total Protein: 6.9 g/dL (ref 6.5–8.1)

## 2021-10-23 LAB — VITAMIN B12: Vitamin B-12: 931 pg/mL — ABNORMAL HIGH (ref 180–914)

## 2021-10-23 LAB — FERRITIN: Ferritin: 133 ng/mL (ref 11–307)

## 2021-10-23 NOTE — Progress Notes (Signed)
? ? ? ?Hematology/Oncology Consult note ?[place]  ?[phone] [phone] ? ?Patient Care Team: ?Remi Haggard, FNP as PCP - General (Family Medicine)  ? ?Name of the patient: Grace Fox  ?[id number]  ?[birth year]  ? ?Date of visit: 10/23/21 ? ?Diagnosis-anemia of chronic kidney disease ? ?Chief complaint/ Reason for visit-routine follow-up of anemia of chronic kidney disease ? ?Heme/Onc history: Patient is a 51 year old female with a past medical history significant for hypothyroidism hypertension CHF COPD and other medical problems.  She has been referred for anemia and thrombocytosis.  Most recent blood work from 03/01/2021 showed mildly elevated alkaline phosphatase of 169.  Calcium low at 8.5 and serum creatinine elevated at 2.  I do not see any results of CBC in the scanned lab records. ?  ?Results of blood work from 04/17/2021 were as follows:CBC showed white cell count of 8, H&H of 10.8/32.6 and a platelet count of 317.  CMP showed a low potassium of 2.7, creatinine of 1.86.  Serum calcium normal at 9.1 with a total protein normal at 7.8.  B12 folate normal.  Ferritin levels normal at 274.  TIBC normal with an iron saturation of 15%.  Myeloma panel showed no M protein.  Haptoglobin normal TSH normal.  Reticulocyte count mildly low for the degree of anemia 2.1%. ?  ? ?Interval history-overall doing well and other than mild fatigue she denies other complaints at this time.  Denies any blood loss in her stool or urine.  Denies any dark melanotic stools ? ?ECOG PS- 1 ?Pain scale- 0 ? ? ?Review of systems- Review of Systems  ?Constitutional:  Positive for malaise/fatigue. Negative for chills, fever and weight loss.  ?HENT:  Negative for congestion, ear discharge and nosebleeds.   ?Eyes:  Negative for blurred vision.  ?Respiratory:  Negative for cough, hemoptysis, sputum production, shortness of breath and wheezing.   ?Cardiovascular:  Negative for chest pain,  palpitations, orthopnea and claudication.  ?Gastrointestinal:  Negative for abdominal pain, blood in stool, constipation, diarrhea, heartburn, melena, nausea and vomiting.  ?Genitourinary:  Negative for dysuria, flank pain, frequency, hematuria and urgency.  ?Musculoskeletal:  Negative for back pain, joint pain and myalgias.  ?Skin:  Negative for rash.  ?Neurological:  Negative for dizziness, tingling, focal weakness, seizures, weakness and headaches.  ?Endo/Heme/Allergies:  Does not bruise/bleed easily.  ?Psychiatric/Behavioral:  Negative for depression and suicidal ideas. The patient does not have insomnia.   ? ?Allergies  ?Allergen Reactions  ? Lactose Intolerance (Gi) Other (See Comments)  ?  Gi upset  ? Other Other (See Comments)  ?  Grass- stuffy nose/itching/sneezing ?Pollen- stuffy nose/itching/sneezing  ? ? ? ?Past Medical History:  ?Diagnosis Date  ? Anemia   ? Anxiety   ? Aortic regurgitation   ? PT STATES SHE NEEDS VALVE REPLACMENT-SEE DR Neoma Laming  ? Back pain   ? SCIATICA  ? CHF (congestive heart failure) ([place])   ? COPD (chronic obstructive pulmonary disease) ([place])   ? Depression   ? Dyspnea   ? DOE  ? Gall stones   ? GERD (gastroesophageal reflux disease)   ? Headache   ? migraines  ? History of kidney stones   ? h/o  ? Hypertension   ? Hypothyroidism   ? Kidney atrophy   ? some damage to due to one kidney due to [facility]  ? Oxygen deficit   ? 2L/ HS  ? Pneumonia   ? PONV (postoperative nausea and vomiting)   ?  with GA  ? Thyroid disease   ? Vertigo   ? ? ? ?Past Surgical History:  ?Procedure Laterality Date  ? CHOLECYSTECTOMY    ? CHOLECYSTECTOMY N/A 02/28/2018  ? Procedure: LAPAROSCOPIC CHOLECYSTECTOMY WITH INTRAOPERATIVE CHOLANGIOGRAM;  Surgeon: Jovita Kussmaul, MD;  Location: [facility] ORS;  Service: General;  Laterality: N/A;  ? LEEP N/A 02/16/2019  ? Procedure: LOOP ELECTROSURGICAL EXCISION PROCEDURE (LEEP);  Surgeon: Harlin Heys, MD;  Location: [facility] ORS;  Service: Gynecology;  Laterality:  N/A;  ? LEFT HEART CATH AND CORONARY ANGIOGRAPHY Left 04/14/2020  ? Procedure: LEFT HEART CATH AND CORONARY ANGIOGRAPHY;  Surgeon: Dionisio David, MD;  Location: [facility];  Service: Cardiovascular;  Laterality: Left;  ? ? ?Social History  ? ?Socioeconomic History  ? Marital status: Divorced  ?  Spouse name: Not on file  ? Number of children: Not on file  ? Years of education: Not on file  ? Highest education level: Not on file  ?Occupational History  ? Not on file  ?Tobacco Use  ? Smoking status: Former  ?  Packs/day: 1.00  ?  Years: 20.00  ?  Pack years: 20.00  ?  Types: Cigarettes  ?  Quit date: 02/06/2021  ?  Years since quitting: 0.7  ? Smokeless tobacco: Never  ?Vaping Use  ? Vaping Use: Never used  ?Substance and Sexual Activity  ? Alcohol use: No  ? Drug use: No  ? Sexual activity: Yes  ?  Birth control/protection: None, I.U.D.  ?Other Topics Concern  ? Not on file  ?Social History Narrative  ? Not on file  ? ?Social Determinants of Health  ? ?Financial Resource Strain: Not on file  ?Food Insecurity: Not on file  ?Transportation Needs: Not on file  ?Physical Activity: Not on file  ?Stress: Not on file  ?Social Connections: Not on file  ?Intimate Partner Violence: Not on file  ? ? ?Family History  ?Problem Relation Age of Onset  ? Diabetes Mother   ? Thyroid disease Mother   ? Heart failure Mother   ? Diabetes Father   ? Heart failure Father   ? Lung cancer Father   ? Heart failure Brother   ? Diabetes Maternal Grandmother   ? Emphysema Maternal Grandmother   ? Hypertension Maternal Grandfather   ? Stroke Maternal Grandfather   ? Heart attack Maternal Grandfather   ? Emphysema Paternal Grandmother   ? Cancer Paternal Grandmother   ? Diabetes Other   ? Heart failure Other   ? Breast cancer Neg Hx   ? ? ? ?Current Outpatient Medications:  ?  albuterol (PROVENTIL HFA;VENTOLIN HFA) 108 (90 BASE) MCG/ACT inhaler, Inhale 2 puffs into the lungs every 4 (four) hours as needed for wheezing or shortness of  breath., Disp: , Rfl:  ?  ALPRAZolam (XANAX) 1 MG tablet, Take 1 mg by mouth 3 (three) times daily as needed for anxiety. , Disp: , Rfl:  ?  budesonide-formoterol (SYMBICORT) 160-4.5 MCG/ACT inhaler, Inhale 2 puffs into the lungs every 12 (twelve) hours., Disp: , Rfl:  ?  buPROPion (WELLBUTRIN XL) 300 MG 24 hr tablet, Take 300 mg by mouth daily. , Disp: , Rfl: 2 ?  Cetirizine HCl (ZYRTEC ALLERGY) 10 MG CAPS, Take 10 mg by mouth daily., Disp: , Rfl:  ?  Cyanocobalamin 1000 MCG/ML KIT, Inject 1 Dose as directed every 30 (thirty) days. AT HOME, Disp: , Rfl:  ?  dapagliflozin propanediol (FARXIGA) 10 MG TABS tablet, Take by  mouth daily., Disp: , Rfl:  ?  ezetimibe (ZETIA) 10 MG tablet, Take 10 mg by mouth daily., Disp: , Rfl:  ?  famotidine (PEPCID) 20 MG tablet, Take 20 mg by mouth 2 (two) times daily., Disp: , Rfl:  ?  ferrous sulfate 325 (65 FE) MG tablet, Take 325 mg by mouth daily with breakfast., Disp: , Rfl:  ?  folic acid (FOLVITE) 1 MG tablet, Take 1 mg by mouth daily., Disp: , Rfl:  ?  furosemide (LASIX) 20 MG tablet, Take 40 mg by mouth daily., Disp: , Rfl:  ?  gemfibrozil (LOPID) 600 MG tablet, Take 600 mg by mouth 2 (two) times daily., Disp: , Rfl:  ?  levothyroxine (SYNTHROID) 50 MCG tablet, Take 50 mcg by mouth daily before breakfast., Disp: , Rfl:  ?  lipase/protease/amylase (CREON) 12000-38000 units CPEP capsule, Take 36,000 Units by mouth., Disp: , Rfl:  ?  losartan (COZAAR) 25 MG tablet, Take 12.5 mg by mouth daily., Disp: , Rfl:  ?  meclizine (ANTIVERT) 25 MG tablet, Take 25 mg by mouth 2 (two) times daily., Disp: , Rfl: 2 ?  metoprolol tartrate (LOPRESSOR) 50 MG tablet, Take 50 mg by mouth 2 (two) times daily., Disp: , Rfl:  ?  OXYGEN, Inhale 2 L into the lungs at bedtime., Disp: , Rfl:  ?  rosuvastatin (CRESTOR) 40 MG tablet, Take 40 mg by mouth daily., Disp: , Rfl:  ?  Bempedoic Acid-Ezetimibe (NEXLIZET) 180-10 MG TABS, Take 1 tablet by mouth daily., Disp: , Rfl:  ?  nitroGLYCERIN (NITROSTAT) 0.3  MG SL tablet, Place 0.3-0.6 mg under the tongue 2 (two) times daily as needed for chest pain. (Patient not taking: Reported on 10/23/2021), Disp: , Rfl:  ?  Potassium Chloride ER 20 MEQ TBCR, Take 20 mEq by m

## 2021-12-13 ENCOUNTER — Other Ambulatory Visit: Payer: Self-pay | Admitting: Family Medicine

## 2021-12-13 DIAGNOSIS — Z1231 Encounter for screening mammogram for malignant neoplasm of breast: Secondary | ICD-10-CM

## 2021-12-14 ENCOUNTER — Encounter: Payer: Medicaid Other | Admitting: Obstetrics and Gynecology

## 2021-12-14 DIAGNOSIS — Z124 Encounter for screening for malignant neoplasm of cervix: Secondary | ICD-10-CM

## 2021-12-14 DIAGNOSIS — Z01419 Encounter for gynecological examination (general) (routine) without abnormal findings: Secondary | ICD-10-CM

## 2021-12-14 DIAGNOSIS — Z1231 Encounter for screening mammogram for malignant neoplasm of breast: Secondary | ICD-10-CM

## 2021-12-15 ENCOUNTER — Encounter: Payer: Self-pay | Admitting: Family Medicine

## 2021-12-19 ENCOUNTER — Other Ambulatory Visit: Payer: Self-pay

## 2021-12-19 DIAGNOSIS — Z1211 Encounter for screening for malignant neoplasm of colon: Secondary | ICD-10-CM

## 2021-12-19 MED ORDER — NA SULFATE-K SULFATE-MG SULF 17.5-3.13-1.6 GM/177ML PO SOLN: 1.0000 | Freq: Once | ORAL | 0 refills | Status: AC

## 2021-12-19 NOTE — Progress Notes (Signed)
Gastroenterology Pre-Procedure Review  Request Date: 01/04/2022 Requesting Physician: Dr. Vicente Males  PATIENT REVIEW QUESTIONS: The patient responded to the following health history questions as indicated:    1. Are you having any GI issues? no 2. Do you have a personal history of Polyps? no 3. Do you have a family history of Colon Cancer or Polyps? no 4. Diabetes Mellitus? no 5. Joint replacements in the past 12 months?no 6. Major health problems in the past 3 months?no 7. Any artificial heart valves, MVP, or defibrillator?no    MEDICATIONS & ALLERGIES:    Patient reports the following regarding taking any anticoagulation/antiplatelet therapy:   Plavix, Coumadin, Eliquis, Xarelto, Lovenox, Pradaxa, Brilinta, or Effient? no Aspirin? no  Patient confirms/reports the following medications:  Current Outpatient Medications  Medication Sig Dispense Refill   albuterol (PROVENTIL HFA;VENTOLIN HFA) 108 (90 BASE) MCG/ACT inhaler Inhale 2 puffs into the lungs every 4 (four) hours as needed for wheezing or shortness of breath.     ALPRAZolam (XANAX) 1 MG tablet Take 1 mg by mouth 3 (three) times daily as needed for anxiety.      Bempedoic Acid-Ezetimibe (NEXLIZET) 180-10 MG TABS Take 1 tablet by mouth daily.     budesonide-formoterol (SYMBICORT) 160-4.5 MCG/ACT inhaler Inhale 2 puffs into the lungs every 12 (twelve) hours.     buPROPion (WELLBUTRIN XL) 300 MG 24 hr tablet Take 300 mg by mouth daily.   2   Cetirizine HCl (ZYRTEC ALLERGY) 10 MG CAPS Take 10 mg by mouth daily.     Cyanocobalamin 1000 MCG/ML KIT Inject 1 Dose as directed every 30 (thirty) days. AT HOME     dapagliflozin propanediol (FARXIGA) 10 MG TABS tablet Take by mouth daily.     ezetimibe (ZETIA) 10 MG tablet Take 10 mg by mouth daily.     famotidine (PEPCID) 20 MG tablet Take 20 mg by mouth 2 (two) times daily.     ferrous sulfate 325 (65 FE) MG tablet Take 325 mg by mouth daily with breakfast.     folic acid (FOLVITE) 1 MG tablet  Take 1 mg by mouth daily.     furosemide (LASIX) 20 MG tablet Take 40 mg by mouth daily.     gemfibrozil (LOPID) 600 MG tablet Take 600 mg by mouth 2 (two) times daily.     levothyroxine (SYNTHROID) 50 MCG tablet Take 50 mcg by mouth daily before breakfast.     lipase/protease/amylase (CREON) 12000-38000 units CPEP capsule Take 36,000 Units by mouth.     losartan (COZAAR) 25 MG tablet Take 12.5 mg by mouth daily.     meclizine (ANTIVERT) 25 MG tablet Take 25 mg by mouth 2 (two) times daily.  2   metoprolol tartrate (LOPRESSOR) 50 MG tablet Take 50 mg by mouth 2 (two) times daily.     nitroGLYCERIN (NITROSTAT) 0.3 MG SL tablet Place 0.3-0.6 mg under the tongue 2 (two) times daily as needed for chest pain. (Patient not taking: Reported on 10/23/2021)     OXYGEN Inhale 2 L into the lungs at bedtime.     Potassium Chloride ER 20 MEQ TBCR Take 20 mEq by mouth daily. (Patient not taking: Reported on 10/23/2021)     QUEtiapine (SEROQUEL) 50 MG tablet Take 50 mg by mouth at bedtime. (Patient not taking: Reported on 10/23/2021)     rosuvastatin (CRESTOR) 40 MG tablet Take 40 mg by mouth daily.     sucralfate (CARAFATE) 1 g tablet Take 1 tablet (1 g total) by mouth 4 (  four) times daily. (Patient not taking: Reported on 10/23/2021) 60 tablet 0   No current facility-administered medications for this visit.   Facility-Administered Medications Ordered in Other Visits  Medication Dose Route Frequency Provider Last Rate Last Admin   sodium chloride flush (NS) 0.9 % injection 3 mL  3 mL Intravenous Q12H Dionisio David, MD        Patient confirms/reports the following allergies:  Allergies  Allergen Reactions   Lactose Intolerance (Gi) Other (See Comments)    Gi upset   Other Other (See Comments)    Grass- stuffy nose/itching/sneezing Pollen- stuffy nose/itching/sneezing    No orders of the defined types were placed in this encounter.   AUTHORIZATION INFORMATION Primary Insurance: 1D#: Group  #:  Secondary Insurance: 1D#: Group #:  SCHEDULE INFORMATION: Date: 01/04/2022 Time: Location:[facility]

## 2021-12-27 ENCOUNTER — Encounter: Payer: Medicaid Other | Admitting: Obstetrics and Gynecology

## 2021-12-27 DIAGNOSIS — Z01419 Encounter for gynecological examination (general) (routine) without abnormal findings: Secondary | ICD-10-CM

## 2021-12-27 DIAGNOSIS — Z124 Encounter for screening for malignant neoplasm of cervix: Secondary | ICD-10-CM

## 2022-01-04 ENCOUNTER — Ambulatory Visit: Admission: RE | Admit: 2022-01-04 | Payer: Medicaid Other | Source: Home / Self Care | Admitting: Gastroenterology

## 2022-01-04 ENCOUNTER — Encounter: Admission: RE | Payer: Self-pay | Source: Home / Self Care

## 2022-01-04 SURGERY — COLONOSCOPY WITH PROPOFOL
Anesthesia: General

## 2022-03-05 ENCOUNTER — Ambulatory Visit: Payer: Medicaid Other | Admitting: Oncology

## 2022-03-05 ENCOUNTER — Inpatient Hospital Stay: Payer: Medicaid Other

## 2022-03-08 ENCOUNTER — Inpatient Hospital Stay: Payer: Medicaid Other | Attending: Oncology

## 2022-03-08 DIAGNOSIS — N183 Chronic kidney disease, stage 3 unspecified: Secondary | ICD-10-CM | POA: Diagnosis not present

## 2022-03-08 DIAGNOSIS — I129 Hypertensive chronic kidney disease with stage 1 through stage 4 chronic kidney disease, or unspecified chronic kidney disease: Secondary | ICD-10-CM | POA: Insufficient documentation

## 2022-03-08 DIAGNOSIS — D631 Anemia in chronic kidney disease: Secondary | ICD-10-CM | POA: Insufficient documentation

## 2022-03-08 LAB — COMPREHENSIVE METABOLIC PANEL
ALT: 16 U/L (ref 0–44)
AST: 23 U/L (ref 15–41)
Albumin: 4.2 g/dL (ref 3.5–5.0)
Alkaline Phosphatase: 126 U/L (ref 38–126)
Anion gap: 13 (ref 5–15)
BUN: 18 mg/dL (ref 6–20)
CO2: 23 mmol/L (ref 22–32)
Calcium: 9.7 mg/dL (ref 8.9–10.3)
Chloride: 100 mmol/L (ref 98–111)
Creatinine, Ser: 1.21 mg/dL — ABNORMAL HIGH (ref 0.44–1.00)
GFR, Estimated: 55 mL/min — ABNORMAL LOW (ref >60)
Glucose, Bld: 119 mg/dL — ABNORMAL HIGH (ref 70–99)
Potassium: 3.1 mmol/L — ABNORMAL LOW (ref 3.5–5.1)
Sodium: 136 mmol/L (ref 135–145)
Total Bilirubin: 0.5 mg/dL (ref 0.3–1.2)
Total Protein: 8.6 g/dL — ABNORMAL HIGH (ref 6.5–8.1)

## 2022-03-08 LAB — FERRITIN: Ferritin: 192 ng/mL (ref 11–307)

## 2022-03-08 LAB — CBC WITH DIFFERENTIAL/PLATELET
Abs Immature Granulocytes: 0.06 10*3/uL (ref 0.00–0.07)
Basophils Absolute: 0.1 10*3/uL (ref 0.0–0.1)
Basophils Relative: 1 %
Eosinophils Absolute: 0.2 10*3/uL (ref 0.0–0.5)
Eosinophils Relative: 2 %
HCT: 35.3 % — ABNORMAL LOW (ref 36.0–46.0)
Hemoglobin: 11.9 g/dL — ABNORMAL LOW (ref 12.0–15.0)
Immature Granulocytes: 1 %
Lymphocytes Relative: 27 %
Lymphs Abs: 2.2 10*3/uL (ref 0.7–4.0)
MCH: 32.2 pg (ref 26.0–34.0)
MCHC: 33.7 g/dL (ref 30.0–36.0)
MCV: 95.4 fL (ref 80.0–100.0)
Monocytes Absolute: 0.6 10*3/uL (ref 0.1–1.0)
Monocytes Relative: 7 %
Neutro Abs: 5.1 10*3/uL (ref 1.7–7.7)
Neutrophils Relative %: 62 %
Platelets: 369 10*3/uL (ref 150–400)
RBC: 3.7 MIL/uL — ABNORMAL LOW (ref 3.87–5.11)
RDW: 13.6 % (ref 11.5–15.5)
WBC: 8.3 10*3/uL (ref 4.0–10.5)
nRBC: 0 % (ref 0.0–0.2)

## 2022-03-08 LAB — IRON AND TIBC
Iron: 91 ug/dL (ref 28–170)
Saturation Ratios: 18 % (ref 10.4–31.8)
TIBC: 498 ug/dL — ABNORMAL HIGH (ref 250–450)
UIBC: 407 ug/dL

## 2022-03-09 ENCOUNTER — Telehealth: Payer: Self-pay

## 2022-03-09 ENCOUNTER — Other Ambulatory Visit: Payer: Self-pay

## 2022-03-09 MED ORDER — POTASSIUM CHLORIDE CRYS ER 20 MEQ PO TBCR: 20.0000 meq | EXTENDED_RELEASE_TABLET | Freq: Every day | ORAL | 0 refills | Status: DC

## 2022-03-09 NOTE — Progress Notes (Signed)
Pt is aware that potassium has been sent in to pharmacy. As well as, I am sending her results over to PCP in order to follow up.

## 2022-04-05 NOTE — Telephone Encounter (Signed)
TO CLOSE OPEN ENCOUNTER. 

## 2022-06-22 ENCOUNTER — Inpatient Hospital Stay: Payer: Medicaid Other

## 2022-06-22 ENCOUNTER — Encounter: Payer: Self-pay | Admitting: Oncology

## 2022-06-22 ENCOUNTER — Inpatient Hospital Stay: Payer: Medicaid Other | Attending: Oncology | Admitting: Oncology

## 2022-06-22 VITALS — BP 134/69 | HR 75 | Temp 97.2°F | Wt 232.0 lb

## 2022-06-22 DIAGNOSIS — Z801 Family history of malignant neoplasm of trachea, bronchus and lung: Secondary | ICD-10-CM | POA: Diagnosis not present

## 2022-06-22 DIAGNOSIS — Z87891 Personal history of nicotine dependence: Secondary | ICD-10-CM | POA: Insufficient documentation

## 2022-06-22 DIAGNOSIS — I130 Hypertensive heart and chronic kidney disease with heart failure and stage 1 through stage 4 chronic kidney disease, or unspecified chronic kidney disease: Secondary | ICD-10-CM | POA: Diagnosis present

## 2022-06-22 DIAGNOSIS — D631 Anemia in chronic kidney disease: Secondary | ICD-10-CM | POA: Diagnosis not present

## 2022-06-22 DIAGNOSIS — N183 Chronic kidney disease, stage 3 unspecified: Secondary | ICD-10-CM | POA: Diagnosis not present

## 2022-06-22 DIAGNOSIS — I509 Heart failure, unspecified: Secondary | ICD-10-CM | POA: Diagnosis not present

## 2022-06-22 LAB — COMPREHENSIVE METABOLIC PANEL
ALT: 9 U/L (ref 0–44)
AST: 15 U/L (ref 15–41)
Albumin: 3.6 g/dL (ref 3.5–5.0)
Alkaline Phosphatase: 133 U/L — ABNORMAL HIGH (ref 38–126)
Anion gap: 13 (ref 5–15)
BUN: 15 mg/dL (ref 6–20)
CO2: 24 mmol/L (ref 22–32)
Calcium: 9.2 mg/dL (ref 8.9–10.3)
Chloride: 103 mmol/L (ref 98–111)
Creatinine, Ser: 1.42 mg/dL — ABNORMAL HIGH (ref 0.44–1.00)
GFR, Estimated: 45 mL/min — ABNORMAL LOW (ref >60)
Glucose, Bld: 130 mg/dL — ABNORMAL HIGH (ref 70–99)
Potassium: 3.1 mmol/L — ABNORMAL LOW (ref 3.5–5.1)
Sodium: 140 mmol/L (ref 135–145)
Total Bilirubin: 0.4 mg/dL (ref 0.3–1.2)
Total Protein: 7.6 g/dL (ref 6.5–8.1)

## 2022-06-22 LAB — CBC WITH DIFFERENTIAL/PLATELET
Abs Immature Granulocytes: 0.05 10*3/uL (ref 0.00–0.07)
Basophils Absolute: 0.1 10*3/uL (ref 0.0–0.1)
Basophils Relative: 1 %
Eosinophils Absolute: 0.1 10*3/uL (ref 0.0–0.5)
Eosinophils Relative: 2 %
HCT: 34.4 % — ABNORMAL LOW (ref 36.0–46.0)
Hemoglobin: 11.5 g/dL — ABNORMAL LOW (ref 12.0–15.0)
Immature Granulocytes: 1 %
Lymphocytes Relative: 30 %
Lymphs Abs: 2 10*3/uL (ref 0.7–4.0)
MCH: 31.7 pg (ref 26.0–34.0)
MCHC: 33.4 g/dL (ref 30.0–36.0)
MCV: 94.8 fL (ref 80.0–100.0)
Monocytes Absolute: 0.5 10*3/uL (ref 0.1–1.0)
Monocytes Relative: 7 %
Neutro Abs: 3.9 10*3/uL (ref 1.7–7.7)
Neutrophils Relative %: 59 %
Platelets: 322 10*3/uL (ref 150–400)
RBC: 3.63 MIL/uL — ABNORMAL LOW (ref 3.87–5.11)
RDW: 13.6 % (ref 11.5–15.5)
WBC: 6.6 10*3/uL (ref 4.0–10.5)
nRBC: 0 % (ref 0.0–0.2)

## 2022-06-22 LAB — FERRITIN: Ferritin: 162 ng/mL (ref 11–307)

## 2022-06-22 LAB — IRON AND TIBC
Iron: 63 ug/dL (ref 28–170)
Saturation Ratios: 14 % (ref 10.4–31.8)
TIBC: 437 ug/dL (ref 250–450)
UIBC: 374 ug/dL

## 2022-06-23 NOTE — Progress Notes (Signed)
Hematology/Oncology Consult note [facility]  [phone] [phone]  Patient Care Team: Remi Haggard, FNP as PCP - General (Family Medicine)   Name of the patient: Grace Fox  [id number]  Oct 13, 1970   Date of visit: 06/23/22  Diagnosis-anemia of chronic kidney disease  Chief complaint/ Reason for visit-routine follow-up of anemia  Heme/Onc history: Patient is a 51 year old female with a past medical history significant for hypothyroidism hypertension CHF COPD and other medical problems.  She has been referred for anemia and thrombocytosis.  Most recent blood work from 03/01/2021 showed mildly elevated alkaline phosphatase of 169.  Calcium low at 8.5 and serum creatinine elevated at 2.  I do not see any results of CBC in the scanned lab records.   Results of blood work from 04/17/2021 were as follows:CBC showed white cell count of 8, H&H of 10.8/32.6 and a platelet count of 317.  CMP showed a low potassium of 2.7, creatinine of 1.86.  Serum calcium normal at 9.1 with a total protein normal at 7.8.  B12 folate normal.  Ferritin levels normal at 274.  TIBC normal with an iron saturation of 15%.  Myeloma panel showed no M protein.  Haptoglobin normal TSH normal.  Reticulocyte count mildly low for the degree of anemia 2.1%.  Interval history-patient feels well overall.  She reports mild fatigue.  Denies any blood loss in her stool or urine.  ECOG PS- 1 Pain scale- 0  Review of systems- Review of Systems  Constitutional:  Negative for chills, fever, malaise/fatigue and weight loss.  HENT:  Negative for congestion, ear discharge and nosebleeds.   Eyes:  Negative for blurred vision.  Respiratory:  Negative for cough, hemoptysis, sputum production, shortness of breath and wheezing.   Cardiovascular:  Negative for chest pain, palpitations, orthopnea and claudication.  Gastrointestinal:  Negative for abdominal pain, blood in stool,  constipation, diarrhea, heartburn, melena, nausea and vomiting.  Genitourinary:  Negative for dysuria, flank pain, frequency, hematuria and urgency.  Musculoskeletal:  Negative for back pain, joint pain and myalgias.  Skin:  Negative for rash.  Neurological:  Negative for dizziness, tingling, focal weakness, seizures, weakness and headaches.  Endo/Heme/Allergies:  Does not bruise/bleed easily.  Psychiatric/Behavioral:  Negative for depression and suicidal ideas. The patient does not have insomnia.       Allergies  Allergen Reactions   Lactose Intolerance (Gi) Other (See Comments)    Gi upset   Other Other (See Comments)    Grass- stuffy nose/itching/sneezing Pollen- stuffy nose/itching/sneezing     Past Medical History:  Diagnosis Date   Anemia    Anxiety    Aortic regurgitation    PT STATES SHE NEEDS VALVE REPLACMENT-SEE DR Earlyne Iba KHAN   Back pain    SCIATICA   CHF (congestive heart failure) (HCC)    COPD (chronic obstructive pulmonary disease) (HCC)    Depression    Dyspnea    DOE   Gall stones    GERD (gastroesophageal reflux disease)    Headache    migraines   History of kidney stones    h/o   Hypertension    Hypothyroidism    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   Oxygen deficit    2L/ HS   Pneumonia    PONV (postoperative nausea and vomiting)    with GA   Thyroid disease    Vertigo      Past Surgical History:  Procedure Laterality Date   CHOLECYSTECTOMY     CHOLECYSTECTOMY N/A 02/28/2018   Procedure: LAPAROSCOPIC CHOLECYSTECTOMY WITH INTRAOPERATIVE CHOLANGIOGRAM;  Surgeon: Jovita Kussmaul, MD;  Location: [facility];  Service: General;  Laterality: N/A;   LEEP N/A 02/16/2019   Procedure: LOOP ELECTROSURGICAL EXCISION PROCEDURE (LEEP);  Surgeon: Harlin Heys, MD;  Location: [facility];  Service: Gynecology;  Laterality: N/A;   LEFT HEART CATH AND CORONARY ANGIOGRAPHY Left 04/14/2020   Procedure: LEFT HEART CATH AND CORONARY  ANGIOGRAPHY;  Surgeon: Dionisio David, MD;  Location: [place] [facility];  Service: Cardiovascular;  Laterality: Left;    Social History   Socioeconomic History   Marital status: Divorced    Spouse name: Not on file   Number of children: Not on file   Years of education: Not on file   Highest education level: Not on file  Occupational History   Not on file  Tobacco Use   Smoking status: Former    Packs/day: 1.00    Years: 20.00    Total pack years: 20.00    Types: Cigarettes    Quit date: 02/06/2021    Years since quitting: 1.3   Smokeless tobacco: Never  Vaping Use   Vaping Use: Never used  Substance and Sexual Activity   Alcohol use: No   Drug use: No   Sexual activity: Yes    Birth control/protection: None, I.U.D.  Other Topics Concern   Not on file  Social History Narrative   Not on file   Social Determinants of Health   Financial Resource Strain: Not on file  Food Insecurity: Not on file  Transportation Needs: Not on file  Physical Activity: Not on file  Stress: Not on file  Social Connections: Not on file  Intimate Partner Violence: Not on file    Family History  Problem Relation Age of Onset   Diabetes Mother    Thyroid disease Mother    Heart failure Mother    Diabetes Father    Heart failure Father    Lung cancer Father    Heart failure Brother    Diabetes Maternal Grandmother    Emphysema Maternal Grandmother    Hypertension Maternal Grandfather    Stroke Maternal Grandfather    Heart attack Maternal Grandfather    Emphysema Paternal Grandmother    Cancer Paternal Grandmother    Diabetes Other    Heart failure Other    Breast cancer Neg Hx      Current Outpatient Medications:    albuterol (PROVENTIL HFA;VENTOLIN HFA) 108 (90 BASE) MCG/ACT inhaler, Inhale 2 puffs into the lungs every 4 (four) hours as needed for wheezing or shortness of breath., Disp: , Rfl:    ALPRAZolam (XANAX) 1 MG tablet, Take 1 mg by mouth 3 (three) times daily as  needed for anxiety. , Disp: , Rfl:    Bempedoic Acid-Ezetimibe (NEXLIZET) 180-10 MG TABS, Take 1 tablet by mouth daily., Disp: , Rfl:    budesonide-formoterol (SYMBICORT) 160-4.5 MCG/ACT inhaler, Inhale 2 puffs into the lungs every 12 (twelve) hours., Disp: , Rfl:    buPROPion (WELLBUTRIN XL) 300 MG 24 hr tablet, Take 300 mg by mouth daily. , Disp: , Rfl: 2   Cetirizine HCl (ZYRTEC ALLERGY) 10 MG CAPS, Take 10 mg by mouth daily., Disp: , Rfl:    Cyanocobalamin 1000 MCG/ML KIT, Inject 1 Dose as directed every 30 (thirty) days. AT HOME, Disp: , Rfl:    dapagliflozin propanediol (FARXIGA) 10 MG TABS tablet, Take by  mouth daily., Disp: , Rfl:    ezetimibe (ZETIA) 10 MG tablet, Take 10 mg by mouth daily., Disp: , Rfl:    famotidine (PEPCID) 20 MG tablet, Take 20 mg by mouth 2 (two) times daily., Disp: , Rfl:    ferrous sulfate 325 (65 FE) MG tablet, Take 325 mg by mouth daily with breakfast., Disp: , Rfl:    folic acid (FOLVITE) 1 MG tablet, Take 1 mg by mouth daily., Disp: , Rfl:    furosemide (LASIX) 20 MG tablet, Take 40 mg by mouth daily., Disp: , Rfl:    gemfibrozil (LOPID) 600 MG tablet, Take 600 mg by mouth 2 (two) times daily., Disp: , Rfl:    levothyroxine (SYNTHROID) 50 MCG tablet, Take 50 mcg by mouth daily before breakfast., Disp: , Rfl:    lipase/protease/amylase (CREON) 12000-38000 units CPEP capsule, Take 36,000 Units by mouth., Disp: , Rfl:    losartan (COZAAR) 25 MG tablet, Take 12.5 mg by mouth daily., Disp: , Rfl:    meclizine (ANTIVERT) 25 MG tablet, Take 25 mg by mouth 2 (two) times daily., Disp: , Rfl: 2   metoprolol tartrate (LOPRESSOR) 50 MG tablet, Take 50 mg by mouth 2 (two) times daily., Disp: , Rfl:    OXYGEN, Inhale 2 L into the lungs at bedtime., Disp: , Rfl:    potassium chloride SA (KLOR-CON M) 20 MEQ tablet, Take 1 tablet (20 mEq total) by mouth daily., Disp: 14 tablet, Rfl: 0   rosuvastatin (CRESTOR) 40 MG tablet, Take 40 mg by mouth daily., Disp: , Rfl:     nitroGLYCERIN (NITROSTAT) 0.3 MG SL tablet, Place 0.3-0.6 mg under the tongue 2 (two) times daily as needed for chest pain. (Patient not taking: Reported on 10/23/2021), Disp: , Rfl:    Potassium Chloride ER 20 MEQ TBCR, Take 20 mEq by mouth daily. (Patient not taking: Reported on 10/23/2021), Disp: , Rfl:    QUEtiapine (SEROQUEL) 50 MG tablet, Take 50 mg by mouth at bedtime. (Patient not taking: Reported on 10/23/2021), Disp: , Rfl:    sucralfate (CARAFATE) 1 g tablet, Take 1 tablet (1 g total) by mouth 4 (four) times daily. (Patient not taking: Reported on 10/23/2021), Disp: 60 tablet, Rfl: 0 No current facility-administered medications for this visit.  Facility-Administered Medications Ordered in Other Visits:    sodium chloride flush (NS) 0.9 % injection 3 mL, 3 mL, Intravenous, Q12H, Dionisio David, MD  Physical exam:  Vitals:   06/22/22 1314  BP: 134/69  Pulse: 75  Temp: (!) 97.2 F (36.2 C)  TempSrc: Tympanic  Weight: 232 lb (105.2 kg)   Physical Exam Cardiovascular:     Rate and Rhythm: Normal rate and regular rhythm.     Heart sounds: Normal heart sounds.  Pulmonary:     Effort: Pulmonary effort is normal.     Breath sounds: Normal breath sounds.  Abdominal:     General: Bowel sounds are normal.     Palpations: Abdomen is soft.  Skin:    General: Skin is warm and dry.  Neurological:     Mental Status: She is alert and oriented to person, place, and time.         Latest Ref Rng & Units 06/22/2022    1:05 PM  CMP  Glucose 70 - 99 mg/dL 130   BUN 6 - 20 mg/dL 15   Creatinine 0.44 - 1.00 mg/dL 1.42   Sodium 135 - 145 mmol/L 140   Potassium 3.5 - 5.1 mmol/L 3.1  Chloride 98 - 111 mmol/L 103   CO2 22 - 32 mmol/L 24   Calcium 8.9 - 10.3 mg/dL 9.2   Total Protein 6.5 - 8.1 g/dL 7.6   Total Bilirubin 0.3 - 1.2 mg/dL 0.4   Alkaline Phos 38 - 126 U/L 133   AST 15 - 41 U/L 15   ALT 0 - 44 U/L 9       Latest Ref Rng & Units 06/22/2022    1:05 PM  CBC  WBC 4.0 -  10.5 K/uL 6.6   Hemoglobin 12.0 - 15.0 g/dL 11.5   Hematocrit 36.0 - 46.0 % 34.4   Platelets 150 - 400 K/uL 322      Assessment and plan- Patient is a 51 y.o. female here for routine forFollow-up of anemia of chronic kidney disease  Hemoglobin is presently stable between 10.5-11.5.  Today it is 11.5.  Ferritin levels are normal at 162 with an iron saturation of 14%.  She has mild CKD with a creatinine that fluctuates between 1.2-1.8.  Given the stability of her hemoglobin she does not require any IV iron or EPO at this time.  I will repeat CBC ferritin and iron studies in 6 months in 1 year and see her back in 1 year   Visit Diagnosis 1. Anemia of chronic kidney failure, stage 3 (moderate) (HCC)      Dr. Randa Evens, MD, MPH [facility] [phone] 06/23/2022 12:57 PM

## 2022-08-17 ENCOUNTER — Other Ambulatory Visit: Payer: Self-pay | Admitting: Cardiovascular Disease

## 2022-08-17 DIAGNOSIS — E782 Mixed hyperlipidemia: Secondary | ICD-10-CM

## 2022-09-21 ENCOUNTER — Other Ambulatory Visit: Payer: Self-pay

## 2022-09-21 DIAGNOSIS — E782 Mixed hyperlipidemia: Secondary | ICD-10-CM

## 2022-09-24 MED ORDER — GEMFIBROZIL 600 MG PO TABS: 600.0000 mg | ORAL_TABLET | Freq: Two times a day (BID) | ORAL | 0 refills | Status: DC

## 2022-09-28 ENCOUNTER — Other Ambulatory Visit: Payer: Self-pay | Admitting: Cardiovascular Disease

## 2022-10-18 ENCOUNTER — Other Ambulatory Visit: Payer: Self-pay | Admitting: Cardiovascular Disease

## 2022-10-30 ENCOUNTER — Ambulatory Visit: Payer: Medicaid Other | Admitting: Obstetrics and Gynecology

## 2022-10-30 DIAGNOSIS — Z01419 Encounter for gynecological examination (general) (routine) without abnormal findings: Secondary | ICD-10-CM

## 2022-10-30 DIAGNOSIS — Z124 Encounter for screening for malignant neoplasm of cervix: Secondary | ICD-10-CM

## 2022-10-30 DIAGNOSIS — Z1231 Encounter for screening mammogram for malignant neoplasm of breast: Secondary | ICD-10-CM

## 2022-10-31 ENCOUNTER — Other Ambulatory Visit: Payer: Self-pay | Admitting: Cardiovascular Disease

## 2022-11-14 ENCOUNTER — Other Ambulatory Visit: Payer: Self-pay | Admitting: Cardiovascular Disease

## 2022-12-06 ENCOUNTER — Encounter: Payer: Self-pay | Admitting: Cardiovascular Disease

## 2022-12-06 ENCOUNTER — Ambulatory Visit (INDEPENDENT_AMBULATORY_CARE_PROVIDER_SITE_OTHER): Payer: Medicaid Other | Admitting: Cardiovascular Disease

## 2022-12-06 VITALS — BP 128/74 | HR 89 | Ht 65.0 in | Wt 234.0 lb

## 2022-12-06 DIAGNOSIS — I340 Nonrheumatic mitral (valve) insufficiency: Secondary | ICD-10-CM | POA: Diagnosis not present

## 2022-12-06 DIAGNOSIS — I5033 Acute on chronic diastolic (congestive) heart failure: Secondary | ICD-10-CM

## 2022-12-06 DIAGNOSIS — I420 Dilated cardiomyopathy: Secondary | ICD-10-CM

## 2022-12-06 DIAGNOSIS — R0602 Shortness of breath: Secondary | ICD-10-CM | POA: Diagnosis not present

## 2022-12-06 NOTE — Progress Notes (Signed)
Cardiology Office Note   Date:  12/06/2022   ID:  TONAE GEORGI, DOB 1970-07-16, MRN [id number]  PCP:  Armando Gang, FNP  Cardiologist:  Adrian Blackwater, MD      History of Present Illness: Grace Fox is a 52 y.o. female who presents for  Chief Complaint  Patient presents with   Follow-up    4 month follow up    Feeling better, lost brother and is sad.      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   Oxygen deficit    2L/ HS   Pneumonia    PONV (postoperative nausea and vomiting)    with GA   Thyroid disease    Vertigo      Past Surgical History:  Procedure Laterality Date   CHOLECYSTECTOMY     CHOLECYSTECTOMY N/A 02/28/2018   Procedure: LAPAROSCOPIC CHOLECYSTECTOMY WITH INTRAOPERATIVE CHOLANGIOGRAM;  Surgeon: Griselda Miner, MD;  Location: [facility];  Service: General;  Laterality: N/A;   LEEP N/A 02/16/2019   Procedure: LOOP ELECTROSURGICAL EXCISION PROCEDURE (LEEP);  Surgeon: Linzie Collin, MD;  Location: [facility];  Service: Gynecology;  Laterality: N/A;   LEFT HEART CATH AND CORONARY ANGIOGRAPHY Left 04/14/2020   Procedure: LEFT HEART CATH AND CORONARY ANGIOGRAPHY;  Surgeon: Laurier Nancy, MD;  Location: [facility];  Service: Cardiovascular;  Laterality: Left;     Current Outpatient Medications  Medication Sig Dispense Refill   albuterol (PROVENTIL HFA;VENTOLIN HFA) 108 (90 BASE) MCG/ACT inhaler Inhale 2 puffs into the lungs every 4 (four) hours as  needed for wheezing or shortness of breath.     ALPRAZolam (XANAX) 1 MG tablet Take 1 mg by mouth 3 (three) times daily as needed for anxiety.      budesonide-formoterol (SYMBICORT) 160-4.5 MCG/ACT inhaler Inhale 2 puffs into the lungs every 12 (twelve) hours.     buPROPion (WELLBUTRIN XL) 300 MG 24 hr tablet Take 300 mg by mouth daily.   2   Cetirizine HCl (ZYRTEC ALLERGY) 10 MG CAPS Take 10 mg by mouth daily.     Cyanocobalamin 1000 MCG/ML KIT Inject 1 Dose as directed every 30 (thirty) days. AT HOME     dapagliflozin propanediol (FARXIGA) 10 MG TABS tablet Take by mouth daily.     divalproex (DEPAKOTE) 500 MG DR tablet Take 500 mg by mouth at bedtime.     ezetimibe (ZETIA) 10 MG tablet Take 1 tablet by mouth once daily 90 tablet 0   famotidine (PEPCID) 20 MG tablet Take 20 mg by mouth 2 (two) times daily.     ferrous sulfate 325 (65 FE) MG tablet Take 325 mg by mouth daily with breakfast.     folic acid (FOLVITE) 1 MG tablet Take 1 mg by mouth daily.     furosemide (LASIX) 20 MG  tablet Take 40 mg by mouth daily.     gemfibrozil (LOPID) 600 MG tablet Take 1 tablet (600 mg total) by mouth 2 (two) times daily. 180 tablet 0   levothyroxine (SYNTHROID) 50 MCG tablet Take 50 mcg by mouth daily before breakfast.     lipase/protease/amylase (CREON) 12000-38000 units CPEP capsule Take 36,000 Units by mouth.     losartan (COZAAR) 25 MG tablet Take 12.5 mg by mouth daily.     meclizine (ANTIVERT) 25 MG tablet Take 25 mg by mouth 2 (two) times daily.  2   metFORMIN (GLUCOPHAGE-XR) 500 MG 24 hr tablet Take 500 mg by mouth daily.     metoprolol succinate (TOPROL-XL) 25 MG 24 hr tablet TAKE 1 TABLET BY MOUTH ONCE DAILY . APPOINTMENT REQUIRED FOR FUTURE REFILLS 90 tablet 0   nitroGLYCERIN (NITROSTAT) 0.3 MG SL tablet Place 0.3-0.6 mg under the tongue 2 (two) times daily as needed for chest pain.     OXYGEN Inhale 2 L into the lungs at bedtime.     pravastatin (PRAVACHOL) 40 MG tablet Take 40 mg by mouth  daily.     QUEtiapine (SEROQUEL) 50 MG tablet Take 50 mg by mouth at bedtime.     rosuvastatin (CRESTOR) 40 MG tablet Take 40 mg by mouth daily.     spironolactone (ALDACTONE) 25 MG tablet Take 1/2 (one-half) tablet by mouth once daily 45 tablet 0   sucralfate (CARAFATE) 1 g tablet Take 1 tablet (1 g total) by mouth 4 (four) times daily. 60 tablet 0   No current facility-administered medications for this visit.   Facility-Administered Medications Ordered in Other Visits  Medication Dose Route Frequency Provider Last Rate Last Admin   sodium chloride flush (NS) 0.9 % injection 3 mL  3 mL Intravenous Q12H Adrian Blackwater A, MD        Allergies:   Lactose intolerance (gi) and Other    Social History:   reports that she quit smoking about 21 months ago. Her smoking use included cigarettes. She has a 20.00 pack-year smoking history. She has never used smokeless tobacco. She reports that she does not drink alcohol and does not use drugs.   Family History:  family history includes Cancer in her paternal grandmother; Diabetes in her father, maternal grandmother, mother, and another family member; Emphysema in her maternal grandmother and paternal grandmother; Heart attack in her maternal grandfather; Heart failure in her brother, father, mother, and another family member; Hypertension in her maternal grandfather; Lung cancer in her father; Stroke in her maternal grandfather; Thyroid disease in her mother.    ROS:     Review of Systems  Constitutional: Negative.   HENT: Negative.    Eyes: Negative.   Respiratory: Negative.    Gastrointestinal: Negative.   Genitourinary: Negative.   Musculoskeletal: Negative.   Skin: Negative.   Neurological: Negative.   Endo/Heme/Allergies: Negative.   Psychiatric/Behavioral: Negative.    All other systems reviewed and are negative.     All other systems are reviewed and negative.    PHYSICAL EXAM: VS:  BP 128/74   Pulse 89   Ht 5\' 5"  (1.651 m)   Wt  234 lb (106.1 kg)   SpO2 98%   BMI 38.94 kg/m  , BMI Body mass index is 38.94 kg/m. Last weight:  Wt Readings from Last 3 Encounters:  12/06/22 234 lb (106.1 kg)  06/22/22 232 lb (105.2 kg)  10/23/21 244 lb 12.8 oz (111 kg)     Physical Exam  Constitutional:      Appearance: Normal appearance.  Cardiovascular:     Rate and Rhythm: Normal rate and regular rhythm.     Heart sounds: Normal heart sounds.  Pulmonary:     Effort: Pulmonary effort is normal.     Breath sounds: Normal breath sounds.  Musculoskeletal:     Right lower leg: No edema.     Left lower leg: No edema.  Neurological:     Mental Status: She is alert.       EKG:   Recent Labs: 06/22/2022: ALT 9; BUN 15; Creatinine, Ser 1.42; Hemoglobin 11.5; Platelets 322; Potassium 3.1; Sodium 140    REASON FOR VISIT  Visit for: Echocardiogram/Dilated Cardiomyopathy  Sex:      female   wt= 225   lbs.  BP= 128/76  Height=   65 inches.        TESTS  Imaging: Echocardiogram:  An echocardiogram in (2-d) mode was performed and in Doppler mode with color flow velocity mapping was performed. The aortic valve cusps are abnormal 2.1   cm, flow velocity 1.9   m/s, and systolic calculated mean flow gradient 8   mmHg. Mitral valve diastolic peak flow velocity E 0.6 m/s and E/A ratio 0.8. Aortic root diameter 3.4  cm. The LVOT internal diameter 2.0   cm and flow velocity was abnormal 0.9   m/s. LV systolic dimension 3.1  cm, diastolic 3.9  cm, posterior wall thickness 1.1  cm, fractional shortening 21%, and EF 52%. IVS thickness 1.3   cm. LA dimension 3.3 cm  RIGHT atrium=  10.7  cm2. Mitral Valve =  Ea= 9.2  DT= 405 msec. Pulmonic Valve= PIEDV=  1.1    m/s. Mitral Valve has Mild Regurgitation. Tricuspid Valve is Normal. Pulmonic Valve has Mild Regurgitation. Aortic Valve has Moderate Regurgitation.     ASSESSMENT  Technically adequate study.  Ejection fraction-52  Left Ventricle- Mildly dilated  Left  Ventricle diastolic dysfunction grade-Grade 1 relaxation abnormality  Right Ventricle- Normal size and function  Normal right ventricular wall motion  Left Atrium-Normal size  Right Atrium-Normal size  Aortic valve-Trivial calcification of aortic valve cusps with No stenosis, Moderate regurgitation  Pulmonic Valve-No stenosis, Mild regurgitation  Mitral Valve-Mild regurgitation  Tricuspid Valve-No Regurgitation  Trivial Pericardial effusion.     THERAPY   Referring physician: Laurier Nancy  Sonographer: Lenor Derrick.      Adrian Blackwater MD  Electronically signed by: Adrian Blackwater     Date: 12/26/2020 09:49 ACTIVE PROBLEMS    Angina Pectoris Unstable - Overview: Formatting of this note might be different from the original.Added automatically from request for surgery [id number]    Aortic Regurgitation - 10/2015- Mod-Severe10/2017- mod-severe    Assessment of Shortness of Breath    Cardiomyopathy - 06/2015- EF 15%, valve related ehart failure4/2017- EF 63%04/2016- EF 66%    Chronic kidney disease, stage 4 (severe)    Congestive Heart Failure    Education and Counseling    Mitral Regurgitation - 10/2015- mild-mod10/2017- mild-mod    Nicotine Dependence    Shortness of Breath     CHIEF COMPLAINT  The Chief Complaint is: Cath Results.     REFERRED HERE  Hospital Follow Up.     HISTORY OF PRESENT ILLNESS  Grace Fox is a 52 year old female.   Patient here for results of her cardiac cath. Denies chest pain or shortness of breath. Continues to have high anxiety.  Allergy list reviewed   Problem list  reviewed   Medication reconciliation performed   Medication list reviewed    No chest pain or discomfort   No palpitations   The heart rate was not fast  , and No intermittent leg claudication    Not feeling congested in the chest   No dyspnea   No paroxysmal nocturnal dyspnea   No orthopnea   No cough   No wheezing     CURRENT MEDICATION    BuPROPion HCl ER (XL)  150MG  Oral Tablet Extended Release 24 Hour 150 MG  once daily, 0 days, 0 refills    Cetirizine HCl 10MG  Oral Tablet 10 MG  once daily, 0 days, 0 refills    Chantix Starting Month Pak 0.5 MG X 11 & 1 MG X 42 Oral Tablet use as directed, 30 days, 1 refills    Furosemide 20 MG Oral Tablet Take two tablets daily and third dose as needed for swelling, 90 days, 0 refills    Gemfibrozil 600 MG Oral Tablet Take 1 tablet by mouth twice daily, 90 days, 0 refills    Levothyroxine Sodium Oral Tablet 25 MCG once a day 0 days, 0 refills    Losartan Potassium 25 MG Oral Tablet Take 1 tablet by mouth once daily, 90 days, 0 refills    Potassium Chloride ER 20 MEQ Oral Tablet Extended Release TAKE 1 & 1/2 (ONE & ONE-HALF) TABLETS BY MOUTH ONCE DAILY WITH  LASIX  (FUROSEMIDE), 90 days, 0 refills    ProAir HFA 108 (90 Base)MCG/ACT Inhalation Aerosol Solution 108 (90 Base) MCG/ACT as needed 0 days, 0 refills    SEROquel 100 MG Oral Tablet 1 every bedtime 0 days, 0 refills    Symbicort 160-4.5MCG/ACT Inhalation Aerosol 160-4.5 MCG/ACT twice a day 0 days, 0 refills    Xanax 1 MG Oral Tablet  three times daily as needed, 0 days, 0 refills    Zantac 150MG  Oral Tablet 150 MG once a day 0 days, 0 refills    Zetia 10MG  Oral Tablet 10 MG  once daily, 0 days, 0 refills    Zoloft 100 MG Oral Tablet  1 po at night, 0 days, 0 refills     PAST MEDICAL/SURGICAL HISTORY  Diagnoses:  Coronary artery disease Cardiomyopathy Left-sided congestive heart failure Aortic regurgitation severre Mitral regurgitation moderate  Edema     SOCIAL HISTORY  Tobacco use:  Tobacco use 1/2 PPD or less, current smoker 1/2 -1 PPD since age 35, and smoking status: Current everyday smoker.     ALLERGIES    No Known Allergies     FAMILY HISTORY  Heart disease  Systemic hypertension Paternal:   Heart disease Mother: CHF  Father: MI, first age 56     REVIEW OF SYSTEMS  Systemic: Not feeling poorly (malaise).  No recent weight  change. Head: No headache and no sinus pain. Cardiovascular: No chest pain or discomfort, no palpitations, and the heart rate was not fast. Pulmonary: No dyspnea, no cough, and no wheezing. Gastrointestinal: No heartburn.  No nausea, no vomiting, no abdominal pain, and no diarrhea. Neurological: No dizziness, no vertigo, and no fainting. Psychological: Anxiety.  No depression.     PHYSICAL FINDINGS    Vitals taken 04/19/2020 09:20 am  BP-Sitting L  110/72 mmHg 100 - 120/56 - 80  Pulse Rate-Sitting  110 bpm 50 - 100  Temp-Tympanic  98 F 96 - 101  Height  65 in 59 - 69  Weight  244 lbs 6.4 oz 98 - 183  Body Mass Index  40.7 kg/m2   Body Surface Area  2.15 m2   Oxygen Saturation  96 % 93 - 100     Vitals taken 04/14/2020 12:30 pm  Temp-Oral  36.6 C 35.56 - 38.33  Height  165.1 cm 149.86 - 175.26  Weight  113.399 kg 44.45 - 83.01  Body Mass Index  41.6 kg/m2   Body Surface Area  2.17 m2      Vitals taken 04/14/2020 05:15 pm  BP  98/68 mmHg 100 - 120/56 - 80  Pulse Rate  84 bpm 50 - 100  Respiration Rate  25 per min 18 - 26  Oxygen Saturation  98 % 93 - 100    General Appearance:  In no acute distress. Lungs:  Clear to auscultation. Cardiovascular: Jugular Venous Distention:  JVD not increased.   JVD not increased. Heart Rate And Rhythm:  Normal.   Normal. Heart Sounds:  Normal. Murmurs:  Murmur heard. Carotid Arteries:  No bruit in the carotid artery. Arterial Pulses:  Equal bilaterally and normal. Edema:  Not present. Abdomen: Auscultation:  Bowel sounds were normal. Palpation:  Abdominal non-tender. Neurological:  Oriented to time, place, and person.     ASSESSMENT   Congestive heart failure  Aortic regurgitation  Mitral regurgitation  Systemic hypertension  Edema  Hyperlipidemia  Nicotine dependence     THERAPY   Continue current medication.  Clinical summary provided to patient.     COUNSELING/EDUCATION   Education and counseling quit  smoking  Discussed concerns about tobacco use     PLAN  Cardiac Cath with Normal Coronaries.    HFrEF - Most recent EF 62%. Continue carvedilol but decrease back to 6.25 as patient has had worsening dizziness but higher dose. Continue losartan 25mg  daily.      HR elevated but did not take medications this morning. recheck 94.      Edema - Stable. Continue 40mg  daily with prn dose in afternoon. Patient states she takes 20mg  in the afternoon 4-5 days weekly.    Hypokalemia - continue potassium chloride to daily.        Hypertriglycedemia - Continue gemfibrizol 600mg  bid. continue zetia.        Aortic regurgitation - mild dizziness continues. Patient unsure if her dizziness is anxiety related vs dehydration vs AR. Cath with normal coronaries. Encouraged patient to remain hydrated. Patient seeing her counselor today regarding her anxiety.      HTN - well controlled.        Tobacco Abuse - patient down to 3-4 cigarettes daily. encouragement provided.      f/u 1 month.     HEALTH REMINDERS    Assess BMI satisfied 04/19/2020.    Assess Tobacco Use satisfied 04/19/2020.    Smoking & Tobacco Cessation Intervention and Counseling satisfied 04/19/2020.      Adrian Blackwater MD  Electronically signed by: Adrian Blackwater     Date: 04/20/2020 12:01 REASON FOR VISIT  Referred by Dr. Adrian Blackwater.        TESTS  Imaging: Computed Tomographic Angiography:  Cardiac multidetector CT was performed paying particular attention to the coronary arteries for the diagnosis of: Chest Pain. Diagnostic Drugs:  Administered iohexol (Omnipaque) through an antecubital vein and images from the examination were analyzed for the presence and extent of coronary artery disease, using 3D image processing software. 100 mL of non-ionic contrast (Omnipaque) was used.        TEST CONCLUSIONS  Quality of study:Excellent.  1-Calcium  score:0  2-Right dominant system  3-Normal coronaries.      Adrian Blackwater MD  Electronically signed by: Adrian Blackwater     Date: 09/16/2017 08:59 TESTS    [facility] [address] [place], [place] [address] [phone] STUDY:  Gated Stress / Rest Myocardial Perfusion Imaging Tomographic (SPECT) Including attenuation correction Wall Motion, Left Ventricular Ejection Fraction By Gated Technique.Persantine Stress Test. SEX: Female    WEIGHT: 250 lbs   HEIGHT: 65 in             ARMS UP: YES/NO                                                                                                                                                                                REFERRING PHYSICIAN: Seward Meth NP-C                                                                                                                                                                                                                      INDICATION FOR STUDY: Angina  TECHNIQUE:  Approximately 20 minutes following the intravenous administration of 11.2 mCi of Tc-33m Sestamibi after stress testing in a reclined supine position with arms above their head if able to do so, gated SPECT imaging of the heart was performed. After about a 2hr break, the patient was injected intravenously with 35.2 mCi of Tc-80m Sestamibi.  Approximately 45 minutes later in the same position as stress imaging SPECT rest imaging of the heart was performed.  STRESS BY:  Adrian Blackwater, MD PROTOCOL:  Persantine   DOSE ADMIN: 10 cc   ROUTE OF ADMINISTRATION: IV                                                                            MAX PRED HR: 174                     85%: 148               75%: 131                                                                                                                    RESTING BP: 116/70    RESTING HR: 97   PEAK BP: 106/60    PEAK HR: 108                                                                    EXERCISE DURATION:    4 min injection                                            REASON FOR TEST TERMINATION:    Protocol end  SYMPTOMS:   None                                                                                                                                                                                                          EKG RESULTS:  Sinus tachycardia. 105/min. Low voltage. No changes with persantine.                                                             IMAGE QUALITY:  Fair  PERFUSION/WALL MOTION FINDINGS: EF = 60% Small mild fixed basal anterior, mid anterior, mid anteroseptal, apical septal, reversible mid anterolateral wall defects, normal wall motion.                                                                           IMPRESSION: Possible ischemia in the LAD territory with normal LVEF, advise CCTA.                                                                                                                                                                                                                                                                                      Adrian Blackwater, MD Stress Interpreting Physician / Nuclear Interpreting Physician                Adrian Blackwater MD  Electronically signed by: Adrian Blackwater     Date: 12/22/2019 11:21 Lipid Panel No results  found for: "CHOL", "TRIG", "HDL", "CHOLHDL", "VLDL", "LDLCALC", "LDLDIRECT"    Other studies Reviewed: Additional studies/ records that were reviewed today include:  Review of the above records demonstrates:       No data to display            ASSESSMENT AND PLAN:    ICD-10-CM   1. CHF (congestive heart failure), NYHA class III, acute on chronic, diastolic (HCC)  I50.33 PCV ECHOCARDIOGRAM COMPLETE   continue losartan, metorolol, aldactone cannot be stopped as had LVEF 15 % in 2018. GFR 38, so can hold lasix if dehydrated or feeling better.    2. SOB (shortness of breath)  R06.02 PCV ECHOCARDIOGRAM COMPLETE    3. Nonrheumatic mitral valve regurgitation  I34.0 PCV ECHOCARDIOGRAM COMPLETE    4. Dilated cardiomyopathy (HCC)  I42.0 PCV ECHOCARDIOGRAM COMPLETE   2018 had LVEF 15%       Problem List Items Addressed This Visit   None Visit Diagnoses  CHF (congestive heart failure), NYHA class III, acute on chronic, diastolic (HCC)    -  Primary   continue losartan, metorolol, aldactone cannot be stopped as had LVEF 15 % in 2018. GFR 38, so can hold lasix if dehydrated or feeling better.   Relevant Medications   pravastatin (PRAVACHOL) 40 MG tablet   Other Relevant Orders   PCV ECHOCARDIOGRAM COMPLETE   SOB (shortness of breath)       Relevant Orders   PCV ECHOCARDIOGRAM COMPLETE   Nonrheumatic mitral valve regurgitation       Relevant Medications   pravastatin (PRAVACHOL) 40 MG tablet   Other Relevant Orders   PCV ECHOCARDIOGRAM COMPLETE   Dilated cardiomyopathy (HCC)       2018 had LVEF 15%   Relevant Medications   pravastatin (PRAVACHOL) 40 MG tablet   Other Relevant Orders   PCV ECHOCARDIOGRAM COMPLETE          Disposition:   Return in about 4 weeks (around 01/03/2023) for echo and f/u.    Total time spent: 30 minutes  Signed,  Adrian Blackwater, MD  12/06/2022 1:48 PM    [facility]

## 2022-12-13 ENCOUNTER — Other Ambulatory Visit: Payer: Medicaid Other

## 2022-12-18 ENCOUNTER — Ambulatory Visit: Payer: Medicaid Other | Admitting: Cardiovascular Disease

## 2022-12-24 ENCOUNTER — Other Ambulatory Visit: Payer: Medicaid Other

## 2022-12-24 ENCOUNTER — Inpatient Hospital Stay: Payer: Medicaid Other | Attending: Oncology

## 2022-12-24 DIAGNOSIS — N183 Chronic kidney disease, stage 3 unspecified: Secondary | ICD-10-CM | POA: Insufficient documentation

## 2022-12-24 DIAGNOSIS — D631 Anemia in chronic kidney disease: Secondary | ICD-10-CM | POA: Diagnosis not present

## 2022-12-24 LAB — COMPREHENSIVE METABOLIC PANEL
ALT: 32 U/L (ref 0–44)
AST: 33 U/L (ref 15–41)
Albumin: 3.9 g/dL (ref 3.5–5.0)
Alkaline Phosphatase: 92 U/L (ref 38–126)
Anion gap: 11 (ref 5–15)
BUN: 23 mg/dL — ABNORMAL HIGH (ref 6–20)
CO2: 23 mmol/L (ref 22–32)
Calcium: 8.6 mg/dL — ABNORMAL LOW (ref 8.9–10.3)
Chloride: 104 mmol/L (ref 98–111)
Creatinine, Ser: 1.47 mg/dL — ABNORMAL HIGH (ref 0.44–1.00)
GFR, Estimated: 43 mL/min — ABNORMAL LOW (ref >60)
Glucose, Bld: 163 mg/dL — ABNORMAL HIGH (ref 70–99)
Potassium: 2.9 mmol/L — ABNORMAL LOW (ref 3.5–5.1)
Sodium: 138 mmol/L (ref 135–145)
Total Bilirubin: 0.5 mg/dL (ref 0.3–1.2)
Total Protein: 7.5 g/dL (ref 6.5–8.1)

## 2022-12-24 LAB — CBC WITH DIFFERENTIAL/PLATELET
Abs Immature Granulocytes: 0.04 10*3/uL (ref 0.00–0.07)
Basophils Absolute: 0.1 10*3/uL (ref 0.0–0.1)
Basophils Relative: 1 %
Eosinophils Absolute: 0.2 10*3/uL (ref 0.0–0.5)
Eosinophils Relative: 2 %
HCT: 35.8 % — ABNORMAL LOW (ref 36.0–46.0)
Hemoglobin: 11.9 g/dL — ABNORMAL LOW (ref 12.0–15.0)
Immature Granulocytes: 0 %
Lymphocytes Relative: 23 %
Lymphs Abs: 2.2 10*3/uL (ref 0.7–4.0)
MCH: 31.1 pg (ref 26.0–34.0)
MCHC: 33.2 g/dL (ref 30.0–36.0)
MCV: 93.5 fL (ref 80.0–100.0)
Monocytes Absolute: 0.6 10*3/uL (ref 0.1–1.0)
Monocytes Relative: 7 %
Neutro Abs: 6.5 10*3/uL (ref 1.7–7.7)
Neutrophils Relative %: 67 %
Platelets: 291 10*3/uL (ref 150–400)
RBC: 3.83 MIL/uL — ABNORMAL LOW (ref 3.87–5.11)
RDW: 13.6 % (ref 11.5–15.5)
WBC: 9.6 10*3/uL (ref 4.0–10.5)
nRBC: 0 % (ref 0.0–0.2)

## 2022-12-24 LAB — FERRITIN: Ferritin: 192 ng/mL (ref 11–307)

## 2022-12-24 LAB — IRON AND TIBC
Iron: 64 ug/dL (ref 28–170)
Saturation Ratios: 17 % (ref 10.4–31.8)
TIBC: 381 ug/dL (ref 250–450)
UIBC: 317 ug/dL

## 2022-12-26 ENCOUNTER — Other Ambulatory Visit: Payer: Medicaid Other

## 2022-12-28 ENCOUNTER — Ambulatory Visit: Payer: Medicaid Other | Admitting: Cardiovascular Disease

## 2023-01-18 ENCOUNTER — Other Ambulatory Visit: Payer: Self-pay | Admitting: Cardiovascular Disease

## 2023-01-27 ENCOUNTER — Other Ambulatory Visit: Payer: Self-pay | Admitting: Cardiovascular Disease

## 2023-02-26 ENCOUNTER — Other Ambulatory Visit: Payer: Self-pay | Admitting: Cardiovascular Disease

## 2023-03-04 ENCOUNTER — Other Ambulatory Visit: Payer: Self-pay

## 2023-03-04 MED ORDER — PRAVASTATIN SODIUM 40 MG PO TABS: 40.0000 mg | ORAL_TABLET | Freq: Every day | ORAL | 1 refills | Status: DC

## 2023-03-04 MED ORDER — METFORMIN HCL ER 500 MG PO TB24: 500.0000 mg | ORAL_TABLET | Freq: Every day | ORAL | 2 refills | Status: DC

## 2023-03-04 MED ORDER — FOLIC ACID 1 MG PO TABS: 1.0000 mg | ORAL_TABLET | Freq: Every day | ORAL | 2 refills | Status: DC

## 2023-03-04 MED ORDER — LOSARTAN POTASSIUM 25 MG PO TABS: 12.5000 mg | ORAL_TABLET | Freq: Every day | ORAL | 1 refills | Status: DC

## 2023-04-16 ENCOUNTER — Other Ambulatory Visit: Payer: Self-pay | Admitting: Cardiovascular Disease

## 2023-04-16 DIAGNOSIS — E782 Mixed hyperlipidemia: Secondary | ICD-10-CM

## 2023-04-17 ENCOUNTER — Other Ambulatory Visit: Payer: Self-pay

## 2023-04-18 ENCOUNTER — Telehealth: Payer: Self-pay | Admitting: Cardiovascular Disease

## 2023-04-18 MED ORDER — METFORMIN HCL ER 500 MG PO TB24: 500.0000 mg | ORAL_TABLET | Freq: Every day | ORAL | 2 refills | Status: DC

## 2023-04-18 MED ORDER — LEVOTHYROXINE SODIUM 50 MCG PO TABS: 50.0000 ug | ORAL_TABLET | Freq: Every day | ORAL | 1 refills | Status: DC

## 2023-04-18 MED ORDER — MECLIZINE HCL 25 MG PO TABS: 25.0000 mg | ORAL_TABLET | Freq: Two times a day (BID) | ORAL | 1 refills | Status: DC

## 2023-04-18 MED ORDER — EZETIMIBE 10 MG PO TABS: 10.0000 mg | ORAL_TABLET | Freq: Every day | ORAL | 0 refills | Status: DC

## 2023-04-18 MED ORDER — GEMFIBROZIL 600 MG PO TABS
600.0000 mg | ORAL_TABLET | Freq: Two times a day (BID) | ORAL | 0 refills | Status: DC
Start: 2023-04-18 — End: 2023-08-27

## 2023-04-18 MED ORDER — PRAVASTATIN SODIUM 40 MG PO TABS: 40.0000 mg | ORAL_TABLET | Freq: Every day | ORAL | 1 refills | Status: DC

## 2023-04-18 MED ORDER — LOSARTAN POTASSIUM 25 MG PO TABS: 12.5000 mg | ORAL_TABLET | Freq: Every day | ORAL | 1 refills | Status: DC

## 2023-04-18 MED ORDER — METOPROLOL SUCCINATE ER 25 MG PO TB24: 25.0000 mg | ORAL_TABLET | Freq: Every day | ORAL | 0 refills | Status: DC

## 2023-04-18 MED ORDER — FOLIC ACID 1 MG PO TABS: 1.0000 mg | ORAL_TABLET | Freq: Every day | ORAL | 2 refills | Status: DC

## 2023-04-18 MED ORDER — FUROSEMIDE 20 MG PO TABS: 20.0000 mg | ORAL_TABLET | Freq: Every day | ORAL | 0 refills | Status: DC

## 2023-04-18 MED ORDER — SPIRONOLACTONE 25 MG PO TABS: 12.5000 mg | ORAL_TABLET | Freq: Every day | ORAL | 0 refills | Status: DC

## 2023-04-18 NOTE — Telephone Encounter (Signed)
GAVE REFIL MECLAZINE

## 2023-05-06 ENCOUNTER — Ambulatory Visit: Payer: Medicaid Other | Admitting: Cardiology

## 2023-05-07 ENCOUNTER — Other Ambulatory Visit: Payer: Self-pay | Admitting: Cardiovascular Disease

## 2023-06-24 ENCOUNTER — Encounter: Payer: Self-pay | Admitting: Oncology

## 2023-06-24 ENCOUNTER — Inpatient Hospital Stay: Payer: Medicaid Other | Attending: Oncology | Admitting: Oncology

## 2023-06-24 ENCOUNTER — Inpatient Hospital Stay: Payer: Medicaid Other

## 2023-06-24 VITALS — BP 117/73 | HR 85 | Temp 97.3°F | Resp 18 | Wt 228.6 lb

## 2023-06-24 DIAGNOSIS — N183 Chronic kidney disease, stage 3 unspecified: Secondary | ICD-10-CM | POA: Insufficient documentation

## 2023-06-24 DIAGNOSIS — D649 Anemia, unspecified: Secondary | ICD-10-CM

## 2023-06-24 DIAGNOSIS — D631 Anemia in chronic kidney disease: Secondary | ICD-10-CM | POA: Diagnosis not present

## 2023-06-24 DIAGNOSIS — Z801 Family history of malignant neoplasm of trachea, bronchus and lung: Secondary | ICD-10-CM | POA: Diagnosis not present

## 2023-06-24 DIAGNOSIS — Z87891 Personal history of nicotine dependence: Secondary | ICD-10-CM | POA: Diagnosis not present

## 2023-06-24 LAB — CBC WITH DIFFERENTIAL/PLATELET
Abs Immature Granulocytes: 0.07 10*3/uL (ref 0.00–0.07)
Basophils Absolute: 0.1 10*3/uL (ref 0.0–0.1)
Basophils Relative: 1 %
Eosinophils Absolute: 0.2 10*3/uL (ref 0.0–0.5)
Eosinophils Relative: 3 %
HCT: 36.9 % (ref 36.0–46.0)
Hemoglobin: 12.1 g/dL (ref 12.0–15.0)
Immature Granulocytes: 1 %
Lymphocytes Relative: 30 %
Lymphs Abs: 2.3 10*3/uL (ref 0.7–4.0)
MCH: 32.1 pg (ref 26.0–34.0)
MCHC: 32.8 g/dL (ref 30.0–36.0)
MCV: 97.9 fL (ref 80.0–100.0)
Monocytes Absolute: 0.6 10*3/uL (ref 0.1–1.0)
Monocytes Relative: 8 %
Neutro Abs: 4.3 10*3/uL (ref 1.7–7.7)
Neutrophils Relative %: 57 %
Platelets: 356 10*3/uL (ref 150–400)
RBC: 3.77 MIL/uL — ABNORMAL LOW (ref 3.87–5.11)
RDW: 13 % (ref 11.5–15.5)
WBC: 7.5 10*3/uL (ref 4.0–10.5)
nRBC: 0 % (ref 0.0–0.2)

## 2023-06-24 LAB — COMPREHENSIVE METABOLIC PANEL
ALT: 13 U/L (ref 0–44)
AST: 18 U/L (ref 15–41)
Albumin: 3.9 g/dL (ref 3.5–5.0)
Alkaline Phosphatase: 134 U/L — ABNORMAL HIGH (ref 38–126)
Anion gap: 13 (ref 5–15)
BUN: 24 mg/dL — ABNORMAL HIGH (ref 6–20)
CO2: 24 mmol/L (ref 22–32)
Calcium: 9.8 mg/dL (ref 8.9–10.3)
Chloride: 103 mmol/L (ref 98–111)
Creatinine, Ser: 1.69 mg/dL — ABNORMAL HIGH (ref 0.44–1.00)
GFR, Estimated: 37 mL/min — ABNORMAL LOW (ref >60)
Glucose, Bld: 133 mg/dL — ABNORMAL HIGH (ref 70–99)
Potassium: 3.7 mmol/L (ref 3.5–5.1)
Sodium: 140 mmol/L (ref 135–145)
Total Bilirubin: 0.6 mg/dL (ref <1.2)
Total Protein: 7.9 g/dL (ref 6.5–8.1)

## 2023-06-24 LAB — IRON AND TIBC
Iron: 95 ug/dL (ref 28–170)
Saturation Ratios: 21 % (ref 10.4–31.8)
TIBC: 462 ug/dL — ABNORMAL HIGH (ref 250–450)
UIBC: 367 ug/dL

## 2023-06-24 LAB — FERRITIN: Ferritin: 199 ng/mL (ref 11–307)

## 2023-06-24 NOTE — Progress Notes (Signed)
Hematology/Oncology Consult note [facility]  [phone] [phone]  Patient Care Team: Armando Gang, FNP as PCP - General (Family Medicine) Creig Hines, MD as Consulting Physician (Oncology)   Name of the patient: Grace Fox  [id number]  Apr 19, 1971   Date of visit: 06/24/23  Diagnosis-anemia of chronic kidney disease  Chief complaint/ Reason for visit-routine follow-up of anemia  Heme/Onc history: Patient is a 52 year old female with a past medical history significant for hypothyroidism hypertension CHF COPD and other medical problems.  She has been referred for anemia and thrombocytosis.  Most recent blood work from 03/01/2021 showed mildly elevated alkaline phosphatase of 169.  Calcium low at 8.5 and serum creatinine elevated at 2.  I do not see any results of CBC in the scanned lab records.   Results of blood work from 04/17/2021 were as follows:CBC showed white cell count of 8, H&H of 10.8/32.6 and a platelet count of 317.  CMP showed a low potassium of 2.7, creatinine of 1.86.  Serum calcium normal at 9.1 with a total protein normal at 7.8.  B12 folate normal.  Ferritin levels normal at 274.  TIBC normal with an iron saturation of 15%.  Myeloma panel showed no M protein.  Haptoglobin normal TSH normal.  Reticulocyte count mildly low for the degree of anemia 2.1%.    Interval history-she is doing well presently and denies any acuteHealth concerns.  She is following up with Dr. Eulah Pont for her stage III kidney disease.  No recent hospitalizations.  ECOG PS- 1 Pain scale- 0   Review of systems- Review of Systems  Constitutional:  Negative for chills, fever, malaise/fatigue and weight loss.  HENT:  Negative for congestion, ear discharge and nosebleeds.   Eyes:  Negative for blurred vision.  Respiratory:  Negative for cough, hemoptysis, sputum production, shortness of breath and wheezing.   Cardiovascular:  Negative for  chest pain, palpitations, orthopnea and claudication.  Gastrointestinal:  Negative for abdominal pain, blood in stool, constipation, diarrhea, heartburn, melena, nausea and vomiting.  Genitourinary:  Negative for dysuria, flank pain, frequency, hematuria and urgency.  Musculoskeletal:  Negative for back pain, joint pain and myalgias.  Skin:  Negative for rash.  Neurological:  Negative for dizziness, tingling, focal weakness, seizures, weakness and headaches.  Endo/Heme/Allergies:  Does not bruise/bleed easily.  Psychiatric/Behavioral:  Negative for depression and suicidal ideas. The patient does not have insomnia.       Allergies  Allergen Reactions   Lactose Intolerance (Gi) Other (See Comments)    Gi upset   Other Other (See Comments)    Grass- stuffy nose/itching/sneezing Pollen- stuffy nose/itching/sneezing     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   Oxygen deficit    2L/ HS   Pneumonia    PONV (postoperative nausea and vomiting)  with GA   Thyroid disease    Vertigo      Past Surgical History:  Procedure Laterality Date   CHOLECYSTECTOMY     CHOLECYSTECTOMY N/A 02/28/2018   Procedure: LAPAROSCOPIC CHOLECYSTECTOMY WITH INTRAOPERATIVE CHOLANGIOGRAM;  Surgeon: Griselda Miner, MD;  Location: [facility] ORS;  Service: General;  Laterality: N/A;   LEEP N/A 02/16/2019   Procedure: LOOP ELECTROSURGICAL EXCISION PROCEDURE (LEEP);  Surgeon: Linzie Collin, MD;  Location: [facility] ORS;  Service: Gynecology;   Laterality: N/A;   LEFT HEART CATH AND CORONARY ANGIOGRAPHY Left 04/14/2020   Procedure: LEFT HEART CATH AND CORONARY ANGIOGRAPHY;  Surgeon: Laurier Nancy, MD;  Location: [facility] INVASIVE CV LAB;  Service: Cardiovascular;  Laterality: Left;    Social History   Socioeconomic History   Marital status: Divorced    Spouse name: Not on file   Number of children: Not on file   Years of education: Not on file   Highest education level: Not on file  Occupational History   Not on file  Tobacco Use   Smoking status: Former    Current packs/day: 0.00    Average packs/day: 1 pack/day for 20.0 years (20.0 ttl pk-yrs)    Types: Cigarettes    Start date: 02/06/2001    Quit date: 02/06/2021    Years since quitting: 2.3   Smokeless tobacco: Never  Vaping Use   Vaping status: Never Used  Substance and Sexual Activity   Alcohol use: No   Drug use: No   Sexual activity: Yes    Birth control/protection: None, I.U.D.  Other Topics Concern   Not on file  Social History Narrative   Not on file   Social Drivers of Health   Financial Resource Strain: Not on file  Food Insecurity: Not on file  Transportation Needs: Not on file  Physical Activity: Not on file  Stress: Not on file  Social Connections: Not on file  Intimate Partner Violence: Not on file    Family History  Problem Relation Age of Onset   Diabetes Mother    Thyroid disease Mother    Heart failure Mother    Diabetes Father    Heart failure Father    Lung cancer Father    Heart failure Brother    Diabetes Maternal Grandmother    Emphysema Maternal Grandmother    Hypertension Maternal Grandfather    Stroke Maternal Grandfather    Heart attack Maternal Grandfather    Emphysema Paternal Grandmother    Cancer Paternal Grandmother    Diabetes Other    Heart failure Other    Breast cancer Neg Hx      Current Outpatient Medications:    albuterol (PROVENTIL HFA;VENTOLIN HFA) 108 (90 BASE) MCG/ACT inhaler, Inhale 2 puffs into  the lungs every 4 (four) hours as needed for wheezing or shortness of breath., Disp: , Rfl:    ALPRAZolam (XANAX) 1 MG tablet, Take 1 mg by mouth 3 (three) times daily as needed for anxiety. , Disp: , Rfl:    budesonide-formoterol (SYMBICORT) 160-4.5 MCG/ACT inhaler, Inhale 2 puffs into the lungs every 12 (twelve) hours., Disp: , Rfl:    buPROPion (WELLBUTRIN XL) 300 MG 24 hr tablet, Take 300 mg by mouth daily. , Disp: , Rfl: 2   Cetirizine HCl (ZYRTEC ALLERGY) 10 MG CAPS, Take 10 mg by mouth daily., Disp: , Rfl:    Cyanocobalamin 1000 MCG/ML KIT, Inject 1 Dose as directed every 30 (thirty) days. AT HOME, Disp: , Rfl:  dapagliflozin propanediol (FARXIGA) 10 MG TABS tablet, Take by mouth daily., Disp: , Rfl:    divalproex (DEPAKOTE) 500 MG DR tablet, Take 500 mg by mouth at bedtime., Disp: , Rfl:    ezetimibe (ZETIA) 10 MG tablet, TAKE 1 TABLET BY MOUTH DAILY, Disp: 90 tablet, Rfl: 1   famotidine (PEPCID) 20 MG tablet, Take 20 mg by mouth 2 (two) times daily., Disp: , Rfl:    ferrous sulfate 325 (65 FE) MG tablet, Take 325 mg by mouth daily with breakfast., Disp: , Rfl:    folic acid (FOLVITE) 1 MG tablet, Take 1 tablet (1 mg total) by mouth daily., Disp: 90 tablet, Rfl: 2   furosemide (LASIX) 20 MG tablet, TAKE 1 TABLET BY MOUTH DAILY, Disp: 90 tablet, Rfl: 1   gemfibrozil (LOPID) 600 MG tablet, Take 1 tablet (600 mg total) by mouth 2 (two) times daily., Disp: 180 tablet, Rfl: 0   levothyroxine (SYNTHROID) 50 MCG tablet, Take 1 tablet (50 mcg total) by mouth daily before breakfast., Disp: 90 tablet, Rfl: 1   lipase/protease/amylase (CREON) 12000-38000 units CPEP capsule, Take 36,000 Units by mouth., Disp: , Rfl:    losartan (COZAAR) 25 MG tablet, Take 0.5 tablets (12.5 mg total) by mouth daily., Disp: 45 tablet, Rfl: 1   meclizine (ANTIVERT) 25 MG tablet, Take 1 tablet (25 mg total) by mouth 2 (two) times daily., Disp: 180 tablet, Rfl: 1   metFORMIN (GLUCOPHAGE-XR) 500 MG 24 hr tablet, Take 1  tablet (500 mg total) by mouth daily., Disp: 90 tablet, Rfl: 2   metoprolol succinate (TOPROL-XL) 25 MG 24 hr tablet, Take 1 tablet (25 mg total) by mouth daily., Disp: 90 tablet, Rfl: 0   nitroGLYCERIN (NITROSTAT) 0.3 MG SL tablet, Place 0.3-0.6 mg under the tongue 2 (two) times daily as needed for chest pain., Disp: , Rfl:    OXYGEN, Inhale 2 L into the lungs at bedtime., Disp: , Rfl:    pravastatin (PRAVACHOL) 40 MG tablet, Take 1 tablet (40 mg total) by mouth daily., Disp: 30 tablet, Rfl: 1   spironolactone (ALDACTONE) 25 MG tablet, TAKE 1/2 TABLET BY MOUTH DAILY, Disp: 45 tablet, Rfl: 1 No current facility-administered medications for this visit.  Facility-Administered Medications Ordered in Other Visits:    sodium chloride flush (NS) 0.9 % injection 3 mL, 3 mL, Intravenous, Q12H, Laurier Nancy, MD  Physical exam:  Vitals:   06/24/23 1137  BP: 117/73  Pulse: 85  Resp: 18  Temp: (!) 97.3 F (36.3 C)  TempSrc: Tympanic  SpO2: 100%  Weight: 228 lb 9.6 oz (103.7 kg)   Physical Exam Cardiovascular:     Rate and Rhythm: Normal rate and regular rhythm.     Heart sounds: Normal heart sounds.  Pulmonary:     Effort: Pulmonary effort is normal.  Skin:    General: Skin is warm and dry.  Neurological:     Mental Status: She is alert and oriented to person, place, and time.         Latest Ref Rng & Units 06/24/2023   11:09 AM  CMP  Glucose 70 - 99 mg/dL 478   BUN 6 - 20 mg/dL 24   Creatinine 2.95 - 1.00 mg/dL 6.21   Sodium 308 - 657 mmol/L 140   Potassium 3.5 - 5.1 mmol/L 3.7   Chloride 98 - 111 mmol/L 103   CO2 22 - 32 mmol/L 24   Calcium 8.9 - 10.3 mg/dL 9.8   Total Protein 6.5 - 8.1  g/dL 7.9   Total Bilirubin <5.2 mg/dL 0.6   Alkaline Phos 38 - 126 U/L 134   AST 15 - 41 U/L 18   ALT 0 - 44 U/L 13       Latest Ref Rng & Units 06/24/2023   11:09 AM  CBC  WBC 4.0 - 10.5 K/uL 7.5   Hemoglobin 12.0 - 15.0 g/dL 84.1   Hematocrit 32.4 - 46.0 % 36.9   Platelets 150 -  400 K/uL 356     No images are attached to the encounter.  No results found.   Assessment and plan- Patient is a 52 y.o. female here for routine follow-up of anemia of chronic kidney disease  Iron studies are presently normal with a Ferritin of more than 100 and iron saturation of 20%.  She does not require any IV iron at this time.  Hemoglobin has remained stable around 12 and she does not require any EPO either.  Given the stability of her counts I will repeat CBC ferritin and iron studies in 6 months in 1 year and see her back in 1 year   Visit Diagnosis 1. Anemia of chronic kidney failure, stage 3 (moderate) (HCC)   2. Normocytic anemia      Dr. Owens Shark, MD, MPH [facility] at [facility] [phone] 06/24/2023 12:48 PM

## 2023-07-26 ENCOUNTER — Other Ambulatory Visit: Payer: Self-pay | Admitting: Cardiovascular Disease

## 2023-08-08 ENCOUNTER — Encounter: Payer: Self-pay | Admitting: Obstetrics and Gynecology

## 2023-08-08 ENCOUNTER — Other Ambulatory Visit (HOSPITAL_COMMUNITY)
Admission: RE | Admit: 2023-08-08 | Discharge: 2023-08-08 | Disposition: A | Discharge status: Home / Self Care | Payer: Medicaid Other | Source: Ambulatory Visit | Attending: Obstetrics and Gynecology | Admitting: Obstetrics and Gynecology

## 2023-08-08 ENCOUNTER — Ambulatory Visit (INDEPENDENT_AMBULATORY_CARE_PROVIDER_SITE_OTHER): Payer: Medicaid Other | Admitting: Obstetrics and Gynecology

## 2023-08-08 VITALS — BP 117/73 | HR 88 | Ht 65.0 in | Wt 226.3 lb

## 2023-08-08 DIAGNOSIS — Z01419 Encounter for gynecological examination (general) (routine) without abnormal findings: Secondary | ICD-10-CM

## 2023-08-08 DIAGNOSIS — Z1231 Encounter for screening mammogram for malignant neoplasm of breast: Secondary | ICD-10-CM

## 2023-08-08 DIAGNOSIS — Z124 Encounter for screening for malignant neoplasm of cervix: Secondary | ICD-10-CM

## 2023-08-08 DIAGNOSIS — Z1211 Encounter for screening for malignant neoplasm of colon: Secondary | ICD-10-CM

## 2023-08-08 DIAGNOSIS — Z30431 Encounter for routine checking of intrauterine contraceptive device: Secondary | ICD-10-CM

## 2023-08-08 NOTE — Progress Notes (Signed)
Patients presents for annual exam today. She states doing well with current IUD. Due for pap smear, mammogram and colonoscopy, ordered. Annual labs are deferred to PCP. She states no other questions or concerns at this time.

## 2023-08-08 NOTE — Progress Notes (Signed)
HPI:      [name] is a 53 y.o. [phone] who LMP was No LMP recorded. (Menstrual status: IUD).  Subjective:   She presents today for her annual examination.  She has no complaints today.  She has an IUD in place and is not having periods.  She is not having menopausal symptoms. She has a remote history of abnormal Pap and LEEP.  Her last colposcopy was normal. She is due for mammography and colonoscopy. She gets her blood work through her PCP.    Hx: The following portions of the patient's history were reviewed and updated as appropriate:             She  has a past medical history of Anemia, Anxiety, Aortic regurgitation, Back pain, CHF (congestive heart failure) (HCC), COPD (chronic obstructive pulmonary disease) (HCC), Depression, Dyspnea, Gall stones, GERD (gastroesophageal reflux disease), Headache, History of kidney stones, Hypertension, Hypothyroidism, Kidney atrophy, Oxygen deficit, Pneumonia, PONV (postoperative nausea and vomiting), Thyroid disease, and Vertigo. She does not have any pertinent problems on file. She  has a past surgical history that includes Cholecystectomy; Cholecystectomy (N/A, 02/28/2018); LEEP (N/A, 02/16/2019); and LEFT HEART CATH AND CORONARY ANGIOGRAPHY (Left, 04/14/2020). Her family history includes Cancer in her paternal grandmother; Diabetes in her father, maternal grandmother, mother, and another family member; Emphysema in her maternal grandmother and paternal grandmother; Heart attack in her maternal grandfather; Heart failure in her brother, father, mother, and another family member; Hypertension in her maternal grandfather; Lung cancer in her father; Stroke in her maternal grandfather; Thyroid disease in her mother. She  reports that she quit smoking about 2 years ago. Her smoking use included cigarettes. She started smoking about 22 years ago. She has a 20 pack-year smoking history. She has never used smokeless tobacco. She reports that she does  not drink alcohol and does not use drugs. She has a current medication list which includes the following prescription(s): albuterol, alprazolam, budesonide-formoterol, bupropion, zyrtec allergy, cyanocobalamin, divalproex, ezetimibe, famotidine, ferrous sulfate, folic acid, furosemide, gemfibrozil, levothyroxine, lipase/protease/amylase, losartan, meclizine, metformin, metoprolol succinate, nitroglycerin, oxygen-helium, pravastatin, spironolactone, and dapagliflozin propanediol, and the following Facility-Administered Medications: sodium chloride flush. She is allergic to lactose intolerance (gi) and other.       Review of Systems:  Review of Systems  Constitutional: Denied constitutional symptoms, night sweats, recent illness, fatigue, fever, insomnia and weight loss.  Eyes: Denied eye symptoms, eye pain, photophobia, vision change and visual disturbance.  Ears/Nose/Throat/Neck: Denied ear, nose, throat or neck symptoms, hearing loss, nasal discharge, sinus congestion and sore throat.  Cardiovascular: Denied cardiovascular symptoms, arrhythmia, chest pain/pressure, edema, exercise intolerance, orthopnea and palpitations.  Respiratory: Denied pulmonary symptoms, asthma, pleuritic pain, productive sputum, cough, dyspnea and wheezing.  Gastrointestinal: Denied, gastro-esophageal reflux, melena, nausea and vomiting.  Genitourinary: Denied genitourinary symptoms including symptomatic vaginal discharge, pelvic relaxation issues, and urinary complaints.  Musculoskeletal: Denied musculoskeletal symptoms, stiffness, swelling, muscle weakness and myalgia.  Dermatologic: Denied dermatology symptoms, rash and scar.  Neurologic: Denied neurology symptoms, dizziness, headache, neck pain and syncope.  Psychiatric: Denied psychiatric symptoms, anxiety and depression.  Endocrine: Denied endocrine symptoms including hot flashes and night sweats.   Meds:   Current Outpatient Medications on File Prior to Visit   Medication Sig Dispense Refill   albuterol (PROVENTIL HFA;VENTOLIN HFA) 108 (90 BASE) MCG/ACT inhaler Inhale 2 puffs into the lungs every 4 (four) hours as needed for wheezing or shortness of breath.     ALPRAZolam (XANAX) 1 MG tablet Take 1  mg by mouth 3 (three) times daily as needed for anxiety.      budesonide-formoterol (SYMBICORT) 160-4.5 MCG/ACT inhaler Inhale 2 puffs into the lungs every 12 (twelve) hours.     buPROPion (WELLBUTRIN XL) 300 MG 24 hr tablet Take 300 mg by mouth daily.   2   Cetirizine HCl (ZYRTEC ALLERGY) 10 MG CAPS Take 10 mg by mouth daily.     Cyanocobalamin 1000 MCG/ML KIT Inject 1 Dose as directed every 30 (thirty) days. AT HOME     divalproex (DEPAKOTE) 500 MG DR tablet Take 500 mg by mouth at bedtime.     ezetimibe (ZETIA) 10 MG tablet TAKE 1 TABLET BY MOUTH DAILY 90 tablet 1   famotidine (PEPCID) 20 MG tablet Take 20 mg by mouth 2 (two) times daily.     ferrous sulfate 325 (65 FE) MG tablet Take 325 mg by mouth daily with breakfast.     folic acid (FOLVITE) 1 MG tablet Take 1 tablet (1 mg total) by mouth daily. 90 tablet 2   furosemide (LASIX) 20 MG tablet TAKE 1 TABLET BY MOUTH DAILY 90 tablet 1   gemfibrozil (LOPID) 600 MG tablet Take 1 tablet (600 mg total) by mouth 2 (two) times daily. 180 tablet 0   levothyroxine (SYNTHROID) 50 MCG tablet Take 1 tablet (50 mcg total) by mouth daily before breakfast. 90 tablet 1   lipase/protease/amylase (CREON) 12000-38000 units CPEP capsule Take 36,000 Units by mouth.     losartan (COZAAR) 25 MG tablet Take 0.5 tablets (12.5 mg total) by mouth daily. 45 tablet 1   meclizine (ANTIVERT) 25 MG tablet Take 1 tablet (25 mg total) by mouth 2 (two) times daily. 180 tablet 1   metFORMIN (GLUCOPHAGE-XR) 500 MG 24 hr tablet Take 1 tablet (500 mg total) by mouth daily. 90 tablet 2   metoprolol succinate (TOPROL-XL) 25 MG 24 hr tablet Take 1 tablet (25 mg total) by mouth daily. 90 tablet 0   nitroGLYCERIN (NITROSTAT) 0.3 MG SL tablet  Place 0.3-0.6 mg under the tongue 2 (two) times daily as needed for chest pain.     OXYGEN Inhale 2 L into the lungs at bedtime.     pravastatin (PRAVACHOL) 40 MG tablet TAKE 1 TABLET BY MOUTH DAILY 30 tablet 10   spironolactone (ALDACTONE) 25 MG tablet TAKE 1/2 TABLET BY MOUTH DAILY 45 tablet 1   dapagliflozin propanediol (FARXIGA) 10 MG TABS tablet Take by mouth daily. (Patient not taking: Reported on 08/08/2023)     Current Facility-Administered Medications on File Prior to Visit  Medication Dose Route Frequency Provider Last Rate Last Admin   sodium chloride flush (NS) 0.9 % injection 3 mL  3 mL Intravenous Q12H Adrian Blackwater A, MD         Objective:     Vitals:   08/08/23 1438  BP: 117/73  Pulse: 88    Filed Weights   08/08/23 1438  Weight: 226 lb 4.8 oz (102.6 kg)              Physical examination General NAD, Conversant  HEENT Atraumatic; Op clear with mmm.  Normo-cephalic.  Anicteric sclerae  Thyroid/Neck Smooth without nodularity or enlargement. Normal ROM.  Neck Supple.  Skin No rashes, lesions or ulceration. Normal palpated skin turgor. No nodularity.  Breasts: No masses or discharge.  Symmetric.  No axillary adenopathy.  Lungs: Clear to auscultation.No rales or wheezes. Normal Respiratory effort, no retractions.  Heart: NSR.  No murmurs or rubs appreciated. No peripheral  edema  Abdomen: Soft.  Non-tender.  No masses.  No HSM. No hernia  Extremities: Moves all appropriately.  Normal ROM for age. No lymphadenopathy.  Neuro: Oriented to PPT.  Normal mood. Normal affect.     Pelvic:   Vulva: Normal appearance.  No lesions.  Vagina: No lesions or abnormalities noted.  Support: Normal pelvic support.  Urethra No masses tenderness or scarring.  Meatus Normal size without lesions or prolapse.  Cervix: Normal appearance.  No lesions.  IUD strings noted  Anus: Normal exam.  No lesions.  Perineum: Normal exam.  No lesions.        Bimanual   Uterus: Normal size.   Non-tender.  Mobile.  AV.  Adnexae: No masses.  Non-tender to palpation.  Cul-de-sac: Negative for abnormality.     Assessment:    [id number] Patient Active Problem List   Diagnosis Date Noted   Unstable angina (HCC) 04/08/2020   S/P laparoscopic cholecystectomy 02/28/2018   Gallstones 02/28/2018   Malnutrition of moderate degree 06/30/2015   RUQ abdominal pain    Acute cholecystitis 06/28/2015   Allergic reaction 05/26/2015     1. Well woman exam with routine gynecological exam   2. Cervical cancer screening   3. Screening mammogram for breast cancer   4. Encounter for routine checking of intrauterine contraceptive device (IUD)   5. Screen for colon cancer        Plan:            1.  Basic Screening Recommendations The basic screening recommendations for asymptomatic women were discussed with the patient during her visit.  The age-appropriate recommendations were discussed with her and the rational for the tests reviewed.  When I am informed by the patient that another primary care physician has previously obtained the age-appropriate tests and they are up-to-date, only outstanding tests are ordered and referrals given as necessary.  Abnormal results of tests will be discussed with her when all of her results are completed.  Routine preventative health maintenance measures emphasized: Exercise/Diet/Weight control, Tobacco Warnings, Alcohol/Substance use risks and Stress Management  Pap smear performed colonoscopy ordered mammogram ordered  Orders Orders Placed This Encounter  Procedures   MM DIGITAL SCREENING BILATERAL   Ambulatory referral to Gastroenterology    No orders of the defined types were placed in this encounter.         F/U  Return in about 1 year (around 08/07/2024) for Annual Physical.  Elonda Husky, M.D. 08/08/2023 3:19 PM

## 2023-08-16 LAB — CYTOLOGY - PAP
Comment: NEGATIVE
Diagnosis: UNDETERMINED — AB
High risk HPV: NEGATIVE

## 2023-08-26 ENCOUNTER — Encounter: Payer: Self-pay | Admitting: *Deleted

## 2023-08-26 ENCOUNTER — Other Ambulatory Visit: Payer: Self-pay | Admitting: Cardiovascular Disease

## 2023-08-26 DIAGNOSIS — E782 Mixed hyperlipidemia: Secondary | ICD-10-CM

## 2023-09-05 ENCOUNTER — Ambulatory Visit: Payer: Medicaid Other | Admitting: Cardiology

## 2023-09-05 ENCOUNTER — Encounter: Payer: Self-pay | Admitting: Cardiology

## 2023-09-05 VITALS — BP 130/72 | HR 102 | Ht 65.0 in | Wt 224.0 lb

## 2023-09-05 DIAGNOSIS — I351 Nonrheumatic aortic (valve) insufficiency: Secondary | ICD-10-CM | POA: Insufficient documentation

## 2023-09-05 DIAGNOSIS — I5033 Acute on chronic diastolic (congestive) heart failure: Secondary | ICD-10-CM

## 2023-09-05 DIAGNOSIS — I340 Nonrheumatic mitral (valve) insufficiency: Secondary | ICD-10-CM | POA: Diagnosis not present

## 2023-09-05 DIAGNOSIS — I420 Dilated cardiomyopathy: Secondary | ICD-10-CM

## 2023-09-05 MED ORDER — ALBUTEROL SULFATE HFA 108 (90 BASE) MCG/ACT IN AERS
2.0000 | INHALATION_SPRAY | RESPIRATORY_TRACT | 0 refills | Status: DC | PRN
Start: 2023-09-05 — End: 2023-09-25

## 2023-09-05 MED ORDER — ACCRUFER 30 MG PO CAPS: 1.0000 | ORAL_CAPSULE | Freq: Every day | ORAL | 0 refills | Status: DC

## 2023-09-05 MED ORDER — BUDESONIDE-FORMOTEROL FUMARATE 160-4.5 MCG/ACT IN AERO: 2.0000 | INHALATION_SPRAY | Freq: Two times a day (BID) | RESPIRATORY_TRACT | 0 refills | Status: DC

## 2023-09-05 NOTE — Assessment & Plan Note (Signed)
 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

## 2023-09-05 NOTE — Assessment & Plan Note (Signed)
 Echo 12/2020 moderate AVR

## 2023-09-05 NOTE — Progress Notes (Signed)
 Cardiology Office Note   Date:  09/05/2023   ID:  ADELAE YODICE, DOB 1970/07/21, MRN [id number]  PCP:  Armando Gang, FNP  Cardiologist:  Marisue Ivan, NP      History of Present Illness: Grace Fox is a 53 y.o. female who presents for  Chief Complaint  Patient presents with   Follow-up    Needs Follow Up & ECHO    Patient in office for routine cardiac exam. Denies chest pain, shortness of breath, dizziness, lower extremity edema. Patient did not have echo done last year as ordered. Patient does not currently have a PCP, recommended patient get a PCP ASAP.       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   Oxygen deficit    2L/ HS   Pneumonia    PONV (postoperative nausea and vomiting)    with GA   Thyroid disease    Vertigo      Past Surgical History:  Procedure Laterality Date   CHOLECYSTECTOMY     CHOLECYSTECTOMY N/A 02/28/2018   Procedure: LAPAROSCOPIC CHOLECYSTECTOMY WITH INTRAOPERATIVE CHOLANGIOGRAM;  Surgeon: Griselda Miner, MD;  Location: [facility];  Service: General;  Laterality: N/A;   LEEP N/A 02/16/2019   Procedure: LOOP ELECTROSURGICAL EXCISION PROCEDURE (LEEP);  Surgeon: Linzie Collin, MD;  Location: [facility];  Service: Gynecology;  Laterality: N/A;   LEFT HEART CATH AND CORONARY ANGIOGRAPHY Left 04/14/2020   Procedure: LEFT HEART CATH AND CORONARY ANGIOGRAPHY;  Surgeon: Laurier Nancy, MD;  Location: [facility];  Service: Cardiovascular;   Laterality: Left;     Current Outpatient Medications  Medication Sig Dispense Refill   albuterol (VENTOLIN HFA) 108 (90 Base) MCG/ACT inhaler Inhale 2 puffs into the lungs every 4 (four) hours as needed for wheezing or shortness of breath. 18 g 0   ALPRAZolam (XANAX) 1 MG tablet Take 1 mg by mouth 3 (three) times daily as needed for anxiety.      budesonide-formoterol (SYMBICORT) 160-4.5 MCG/ACT inhaler Inhale 2 puffs into the lungs every 12 (twelve) hours. 1 each 0   buPROPion (WELLBUTRIN XL) 300 MG 24 hr tablet Take 300 mg by mouth daily.   2   Cetirizine HCl (ZYRTEC ALLERGY) 10 MG CAPS Take 10 mg by mouth daily.     Cyanocobalamin 1000 MCG/ML KIT Inject 1 Dose as directed every 30 (thirty) days. AT HOME     dapagliflozin propanediol (FARXIGA) 10 MG TABS tablet Take by mouth daily. (Patient not taking: Reported on 08/08/2023)     divalproex (DEPAKOTE) 500 MG DR tablet Take 500 mg by mouth at bedtime.     ezetimibe (ZETIA) 10 MG tablet TAKE 1 TABLET BY MOUTH DAILY 90 tablet 1   famotidine (PEPCID) 20 MG tablet Take 20 mg by mouth 2 (two) times daily.  Ferric Maltol (ACCRUFER) 30 MG CAPS Take 1 capsule (30 mg total) by mouth daily. 90 capsule 0   folic acid (FOLVITE) 1 MG tablet Take 1 tablet (1 mg total) by mouth daily. 90 tablet 2   furosemide (LASIX) 20 MG tablet TAKE 1 TABLET BY MOUTH DAILY 90 tablet 1   gemfibrozil (LOPID) 600 MG tablet TAKE 1 TABLET BY MOUTH TWICE DAILY 60 tablet 10   levothyroxine (SYNTHROID) 50 MCG tablet Take 1 tablet (50 mcg total) by mouth daily before breakfast. 90 tablet 1   lipase/protease/amylase (CREON) 12000-38000 units CPEP capsule Take 36,000 Units by mouth.     losartan (COZAAR) 25 MG tablet Take 0.5 tablets (12.5 mg total) by mouth daily. 45 tablet 1   meclizine (ANTIVERT) 25 MG tablet TAKE 1 TABLET BY MOUTH TWICE DAILY 60 tablet 10   metFORMIN (GLUCOPHAGE-XR) 500 MG 24 hr tablet Take 1 tablet (500 mg total) by mouth daily. 90 tablet 2   metoprolol  succinate (TOPROL-XL) 25 MG 24 hr tablet Take 1 tablet (25 mg total) by mouth daily. 90 tablet 0   nitroGLYCERIN (NITROSTAT) 0.3 MG SL tablet Place 0.3-0.6 mg under the tongue 2 (two) times daily as needed for chest pain.     OXYGEN Inhale 2 L into the lungs at bedtime.     pravastatin (PRAVACHOL) 40 MG tablet TAKE 1 TABLET BY MOUTH DAILY 30 tablet 10   spironolactone (ALDACTONE) 25 MG tablet TAKE 1/2 TABLET BY MOUTH DAILY 45 tablet 1   No current facility-administered medications for this visit.   Facility-Administered Medications Ordered in Other Visits  Medication Dose Route Frequency Provider Last Rate Last Admin   sodium chloride flush (NS) 0.9 % injection 3 mL  3 mL Intravenous Q12H Adrian Blackwater A, MD        Allergies:   Lactose intolerance (gi) and Other    Social History:   reports that she quit smoking about 2 years ago. Her smoking use included cigarettes. She started smoking about 22 years ago. She has a 20 pack-year smoking history. She has never used smokeless tobacco. She reports that she does not drink alcohol and does not use drugs.   Family History:  family history includes Cancer in her paternal grandmother; Diabetes in her father, maternal grandmother, mother, and another family member; Emphysema in her maternal grandmother and paternal grandmother; Heart attack in her maternal grandfather; Heart failure in her brother, father, mother, and another family member; Hypertension in her maternal grandfather; Lung cancer in her father; Stroke in her maternal grandfather; Thyroid disease in her mother.    ROS:     Review of Systems  Constitutional: Negative.   HENT: Negative.    Eyes: Negative.   Respiratory: Negative.  Negative for shortness of breath.   Cardiovascular: Negative.  Negative for chest pain.  Gastrointestinal: Negative.  Negative for abdominal pain, constipation and diarrhea.  Genitourinary: Negative.   Musculoskeletal:  Negative for joint pain and myalgias.   Skin: Negative.   Neurological: Negative.  Negative for dizziness and headaches.  Endo/Heme/Allergies: Negative.   All other systems reviewed and are negative.     All other systems are reviewed and negative.    PHYSICAL EXAM: VS:  BP 130/72   Pulse (!) 102   Ht 5\' 5"  (1.651 m)   Wt 224 lb (101.6 kg)   SpO2 97%   BMI 37.28 kg/m  , BMI Body mass index is 37.28 kg/m. Last weight:  Wt Readings from Last 3 Encounters:  09/05/23 224 lb (101.6 kg)  08/08/23 226 lb 4.8 oz (102.6 kg)  06/24/23 228 lb 9.6 oz (103.7 kg)     Physical Exam Vitals and nursing note reviewed.  Constitutional:      Appearance: Normal appearance. She is normal weight.  HENT:     Head: Normocephalic and atraumatic.     Nose: Nose normal.     Mouth/Throat:     Mouth: Mucous membranes are moist.     Pharynx: Oropharynx is clear.  Eyes:     Conjunctiva/sclera: Conjunctivae normal.     Pupils: Pupils are equal, round, and reactive to light.  Cardiovascular:     Rate and Rhythm: Normal rate and regular rhythm.     Pulses: Normal pulses.     Heart sounds: Normal heart sounds.  Pulmonary:     Effort: Pulmonary effort is normal.     Breath sounds: Normal breath sounds.  Abdominal:     General: Abdomen is flat. Bowel sounds are normal.     Palpations: Abdomen is soft.  Musculoskeletal:        General: Normal range of motion.     Cervical back: Normal range of motion.  Skin:    General: Skin is warm and dry.  Neurological:     General: No focal deficit present.     Mental Status: She is alert and oriented to person, place, and time. Mental status is at baseline.  Psychiatric:        Mood and Affect: Mood normal.        Behavior: Behavior normal.      EKG: none today  Recent Labs: 06/24/2023: ALT 13; BUN 24; Creatinine, Ser 1.69; Hemoglobin 12.1; Platelets 356; Potassium 3.7; Sodium 140    Lipid Panel No results found for: "CHOL", "TRIG", "HDL", "CHOLHDL", "VLDL", "LDLCALC", "LDLDIRECT"     ASSESSMENT AND PLAN:    ICD-10-CM   1. CHF (congestive heart failure), NYHA class III, acute on chronic, diastolic (HCC)  I50.33 PCV ECHOCARDIOGRAM COMPLETE    2. Nonrheumatic mitral valve regurgitation  I34.0 PCV ECHOCARDIOGRAM COMPLETE    3. Dilated cardiomyopathy (HCC)  I42.0 PCV ECHOCARDIOGRAM COMPLETE    4. Nonrheumatic aortic valve insufficiency  I35.1        Problem List Items Addressed This Visit       Cardiovascular and Mediastinum   CHF (congestive heart failure), NYHA class III, acute on chronic, diastolic (HCC) - Primary   Denies chest pain, shortness of breath. Schedule echo.       Relevant Orders   PCV ECHOCARDIOGRAM COMPLETE   Dilated cardiomyopathy (HCC)   Echo 12/2020 EF 52%, grade I DD      Relevant Orders   PCV ECHOCARDIOGRAM COMPLETE   Nonrheumatic mitral valve regurgitation   Echo 12/2020 mild MVR      Relevant Orders   PCV ECHOCARDIOGRAM COMPLETE   Nonrheumatic aortic valve insufficiency   Echo 12/2020 moderate AVR        Disposition:   Return in about 4 weeks (around 10/03/2023) for after echo.    Total time spent: 25 minutes  Signed,  Google, NP  09/05/2023 1:57 PM    [facility]

## 2023-09-05 NOTE — Assessment & Plan Note (Signed)
 Echo 12/2020 EF 52%, grade I DD

## 2023-09-05 NOTE — Assessment & Plan Note (Signed)
 Denies chest pain, shortness of breath. Schedule echo.

## 2023-09-23 ENCOUNTER — Encounter: Payer: Self-pay | Admitting: Internal Medicine

## 2023-09-23 ENCOUNTER — Ambulatory Visit: Payer: Medicaid Other | Admitting: Internal Medicine

## 2023-09-23 VITALS — BP 120/84 | HR 108 | Ht 65.0 in | Wt 226.0 lb

## 2023-09-23 DIAGNOSIS — E782 Mixed hyperlipidemia: Secondary | ICD-10-CM

## 2023-09-23 DIAGNOSIS — E538 Deficiency of other specified B group vitamins: Secondary | ICD-10-CM

## 2023-09-23 DIAGNOSIS — I420 Dilated cardiomyopathy: Secondary | ICD-10-CM

## 2023-09-23 DIAGNOSIS — D638 Anemia in other chronic diseases classified elsewhere: Secondary | ICD-10-CM

## 2023-09-23 DIAGNOSIS — R7309 Other abnormal glucose: Secondary | ICD-10-CM

## 2023-09-23 DIAGNOSIS — E559 Vitamin D deficiency, unspecified: Secondary | ICD-10-CM

## 2023-09-23 DIAGNOSIS — I10 Essential (primary) hypertension: Secondary | ICD-10-CM

## 2023-09-23 DIAGNOSIS — F411 Generalized anxiety disorder: Secondary | ICD-10-CM

## 2023-09-23 DIAGNOSIS — I5033 Acute on chronic diastolic (congestive) heart failure: Secondary | ICD-10-CM

## 2023-09-23 MED ORDER — DAPAGLIFLOZIN PROPANEDIOL 10 MG PO TABS: 10.0000 mg | ORAL_TABLET | Freq: Every day | ORAL | 3 refills | Status: DC

## 2023-09-23 NOTE — Progress Notes (Signed)
 Established Patient Office Visit  Subjective:  Patient ID: Grace Fox, female    DOB: 1970-12-22  Age: 53 y.o. MRN: [id number]  Chief Complaint  Patient presents with   Establish Care    NPE    Patient comes in today to establish primary care at this office.  She is already under the care of cardiology for CHF.  Patient has several medical issues and is being taken care of by other specialties which includes psychiatry, nephrology, oncology for anemia.  She recently had a Pap smear and was scheduled for mammogram and colonoscopy for colon colon cancer screening by her OB/GYN.  She scores high on her PHQ-9/GAD 7 but is under care of psychiatrist who recently adjusted her medications.  Patient is generally feeling better now.  Which she gets anxious easily and her initial blood pressure readings are very high however upon repeating it came down to 120/84. She used to get vitamin B12 injections at home but ran out.  Will draw blood first and then she will get a B12 injection today.. Denies chest pain or shortness of breath, no nausea vomiting no diarrhea or constipation. Patient has history of nocturnal hypoxia but no obstructive sleep apnea.  She uses oxygen for sleeping at night.    No other concerns at this time.   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   Oxygen deficit    2L/ HS   Pneumonia    PONV (postoperative nausea and vomiting)    with GA   Thyroid disease    Vertigo     Past Surgical  History:  Procedure Laterality Date   CHOLECYSTECTOMY     CHOLECYSTECTOMY N/A 02/28/2018   Procedure: LAPAROSCOPIC CHOLECYSTECTOMY WITH INTRAOPERATIVE CHOLANGIOGRAM;  Surgeon: Griselda Miner, MD;  Location: [facility];  Service: General;  Laterality: N/A;   LEEP N/A 02/16/2019   Procedure: LOOP ELECTROSURGICAL EXCISION PROCEDURE (LEEP);  Surgeon: Linzie Collin, MD;  Location: [facility];  Service: Gynecology;  Laterality: N/A;   LEFT HEART CATH AND CORONARY ANGIOGRAPHY Left 04/14/2020   Procedure: LEFT HEART CATH AND CORONARY ANGIOGRAPHY;  Surgeon: Laurier Nancy, MD;  Location: [facility];  Service: Cardiovascular;  Laterality: Left;    Social History   Socioeconomic History   Marital status: Divorced    Spouse name: Not on file   Number of children: Not on file   Years of education: Not on file   Highest education level: Not on file  Occupational History   Not on file  Tobacco Use   Smoking status: Former    Current packs/day: 0.00    Average packs/day: 1 pack/day for 20.0 years (20.0 ttl pk-yrs)    Types: Cigarettes    Start date: 02/06/2001    Quit date: 02/06/2021    Years since quitting: 2.6  Smokeless tobacco: Never  Vaping Use   Vaping status: Some Days  Substance and Sexual Activity   Alcohol use: No   Drug use: No   Sexual activity: Yes    Birth control/protection: I.U.D.  Other Topics Concern   Not on file  Social History Narrative   Not on file   Social Drivers of Health   Financial Resource Strain: Not on file  Food Insecurity: Not on file  Transportation Needs: Not on file  Physical Activity: Not on file  Stress: Not on file  Social Connections: Not on file  Intimate Partner Violence: Not on file    Family History  Problem Relation Age of Onset   Diabetes Mother    Thyroid disease Mother    Heart failure Mother    Diabetes Father    Heart failure Father    Lung cancer Father    Heart failure Brother    Diabetes Maternal Grandmother     Emphysema Maternal Grandmother    Hypertension Maternal Grandfather    Stroke Maternal Grandfather    Heart attack Maternal Grandfather    Emphysema Paternal Grandmother    Cancer Paternal Grandmother    Diabetes Other    Heart failure Other    Breast cancer Neg Hx     Allergies  Allergen Reactions   Lactose Intolerance (Gi) Other (See Comments)    Gi upset   Other Other (See Comments)    Grass- stuffy nose/itching/sneezing Pollen- stuffy nose/itching/sneezing    Outpatient Medications Prior to Visit  Medication Sig   albuterol (VENTOLIN HFA) 108 (90 Base) MCG/ACT inhaler Inhale 2 puffs into the lungs every 4 (four) hours as needed for wheezing or shortness of breath.   ALPRAZolam (XANAX) 1 MG tablet Take 1 mg by mouth 3 (three) times daily as needed for anxiety.    budesonide-formoterol (SYMBICORT) 160-4.5 MCG/ACT inhaler Inhale 2 puffs into the lungs every 12 (twelve) hours.   buPROPion (WELLBUTRIN XL) 300 MG 24 hr tablet Take 300 mg by mouth daily.    Cetirizine HCl (ZYRTEC ALLERGY) 10 MG CAPS Take 10 mg by mouth daily.   divalproex (DEPAKOTE) 500 MG DR tablet Take 500 mg by mouth at bedtime.   ezetimibe (ZETIA) 10 MG tablet TAKE 1 TABLET BY MOUTH DAILY   famotidine (PEPCID) 20 MG tablet Take 20 mg by mouth 2 (two) times daily.   Ferric Maltol (ACCRUFER) 30 MG CAPS Take 1 capsule (30 mg total) by mouth daily.   folic acid (FOLVITE) 1 MG tablet Take 1 tablet (1 mg total) by mouth daily.   furosemide (LASIX) 20 MG tablet TAKE 1 TABLET BY MOUTH DAILY   gemfibrozil (LOPID) 600 MG tablet TAKE 1 TABLET BY MOUTH TWICE DAILY   levothyroxine (SYNTHROID) 50 MCG tablet Take 1 tablet (50 mcg total) by mouth daily before breakfast.   lipase/protease/amylase (CREON) 12000-38000 units CPEP capsule Take 36,000 Units by mouth.   losartan (COZAAR) 25 MG tablet Take 0.5 tablets (12.5 mg total) by mouth daily.   meclizine (ANTIVERT) 25 MG tablet TAKE 1 TABLET BY MOUTH TWICE DAILY   metFORMIN  (GLUCOPHAGE-XR) 500 MG 24 hr tablet Take 1 tablet (500 mg total) by mouth daily.   metoprolol succinate (TOPROL-XL) 25 MG 24 hr tablet Take 1 tablet (25 mg total) by mouth daily.   OXYGEN Inhale 2 L into the lungs at bedtime.   pravastatin (PRAVACHOL) 40 MG tablet TAKE 1 TABLET BY MOUTH DAILY   spironolactone (ALDACTONE) 25 MG tablet TAKE 1/2  TABLET BY MOUTH DAILY   Cyanocobalamin 1000 MCG/ML KIT Inject 1 Dose as directed every 30 (thirty) days. AT HOME (Patient not taking: Reported on 09/23/2023)   nitroGLYCERIN (NITROSTAT) 0.3 MG SL tablet Place 0.3-0.6 mg under the tongue 2 (two) times daily as needed for chest pain. (Patient not taking: Reported on 09/23/2023)   [DISCONTINUED] dapagliflozin propanediol (FARXIGA) 10 MG TABS tablet Take by mouth daily. (Patient not taking: Reported on 09/23/2023)   Facility-Administered Medications Prior to Visit  Medication Dose Route Frequency Provider   sodium chloride flush (NS) 0.9 % injection 3 mL  3 mL Intravenous Q12H Laurier Nancy, MD    Review of Systems  Constitutional: Negative.  Negative for chills, diaphoresis, fever, malaise/fatigue and weight loss.  HENT: Negative.  Negative for congestion, sinus pain and sore throat.   Eyes: Negative.   Respiratory: Negative.  Negative for cough and shortness of breath.   Cardiovascular: Negative.  Negative for chest pain, palpitations and leg swelling.  Gastrointestinal: Negative.  Negative for abdominal pain, constipation, diarrhea, heartburn, nausea and vomiting.  Genitourinary: Negative.  Negative for dysuria and flank pain.  Musculoskeletal: Negative.  Negative for joint pain and myalgias.  Skin: Negative.   Neurological: Negative.  Negative for dizziness, tingling, tremors, sensory change and headaches.  Endo/Heme/Allergies: Negative.   Psychiatric/Behavioral:  Negative for depression and suicidal ideas. The patient is nervous/anxious.        Objective:   BP 120/84   Pulse (!) 108   Ht 5\' 5"   (1.651 m)   Wt 226 lb (102.5 kg)   SpO2 99%   BMI 37.61 kg/m   Vitals:   09/23/23 1422 09/23/23 1441  BP: (!) 150/100 120/84  Pulse: (!) 108   Height: 5\' 5"  (1.651 m)   Weight: 226 lb (102.5 kg)   SpO2: 99%   BMI (Calculated): 37.61     Physical Exam Vitals and nursing note reviewed.  Constitutional:      Appearance: Normal appearance.  HENT:     Head: Normocephalic and atraumatic.     Nose: Nose normal.     Mouth/Throat:     Mouth: Mucous membranes are moist.     Pharynx: Oropharynx is clear.  Eyes:     Conjunctiva/sclera: Conjunctivae normal.     Pupils: Pupils are equal, round, and reactive to light.  Cardiovascular:     Rate and Rhythm: Normal rate and regular rhythm.     Pulses: Normal pulses.     Heart sounds: Normal heart sounds. No murmur heard. Pulmonary:     Effort: Pulmonary effort is normal.     Breath sounds: Normal breath sounds. No wheezing.  Abdominal:     General: Bowel sounds are normal.     Palpations: Abdomen is soft.     Tenderness: There is no abdominal tenderness. There is no right CVA tenderness or left CVA tenderness.  Musculoskeletal:        General: Normal range of motion.     Cervical back: Normal range of motion.     Right lower leg: No edema.     Left lower leg: No edema.  Skin:    General: Skin is warm and dry.  Neurological:     General: No focal deficit present.     Mental Status: She is alert and oriented to person, place, and time.  Psychiatric:        Mood and Affect: Mood normal.        Behavior: Behavior normal.  No results found for any visits on 09/23/23.  Recent Results (from the past 2160 hours)  Cytology - PAP     Status: Abnormal   Collection Time: 08/08/23  2:37 PM  Result Value Ref Range   High risk HPV Negative    Adequacy      Satisfactory for evaluation; transformation zone component PRESENT.   Diagnosis (A)     - Atypical squamous cells of undetermined significance (ASC-US)   Comment Normal  Reference Range HPV - Negative       Assessment & Plan:  Continue current med occasions.  Check blood work today. Problem List Items Addressed This Visit     CHF (congestive heart failure), NYHA class III, acute on chronic, diastolic (HCC)   Relevant Medications   dapagliflozin propanediol (FARXIGA) 10 MG TABS tablet   Dilated cardiomyopathy (HCC)   Other Visit Diagnoses       Essential hypertension, benign    -  Primary   Relevant Orders   CMP14+EGFR     GAD (generalized anxiety disorder)         Mixed hyperlipidemia       Relevant Orders   CMP14+EGFR   Lipid Panel w/o Chol/HDL Ratio     Vitamin B12 deficiency       Relevant Orders   Vitamin B12     Vitamin D deficiency       Relevant Orders   Vitamin D (25 hydroxy)     Elevated glucose level       Relevant Orders   Hemoglobin A1c     Anemia of chronic disease       Relevant Orders   CBC with Diff       Return in about 6 weeks (around 11/04/2023).   Total time spent: 30 minutes  Margaretann Loveless, MD  09/23/2023   This document may have been prepared by [facility] Voice Recognition software and as such may include unintentional dictation errors.

## 2023-09-24 ENCOUNTER — Other Ambulatory Visit: Payer: Self-pay | Admitting: Cardiology

## 2023-09-25 LAB — CBC WITH DIFFERENTIAL/PLATELET
Basophils Absolute: 0.1 10*3/uL (ref 0.0–0.2)
Basos: 1 %
EOS (ABSOLUTE): 0.3 10*3/uL (ref 0.0–0.4)
Eos: 2 %
Hematocrit: 38.2 % (ref 34.0–46.6)
Hemoglobin: 12.9 g/dL (ref 11.1–15.9)
Immature Grans (Abs): 0.2 10*3/uL — ABNORMAL HIGH (ref 0.0–0.1)
Immature Granulocytes: 1 %
Lymphocytes Absolute: 2.8 10*3/uL (ref 0.7–3.1)
Lymphs: 23 %
MCH: 31.8 pg (ref 26.6–33.0)
MCHC: 33.8 g/dL (ref 31.5–35.7)
MCV: 94 fL (ref 79–97)
Monocytes Absolute: 1 10*3/uL — ABNORMAL HIGH (ref 0.1–0.9)
Monocytes: 8 %
Neutrophils Absolute: 7.6 10*3/uL — ABNORMAL HIGH (ref 1.4–7.0)
Neutrophils: 65 %
Platelets: 448 10*3/uL (ref 150–450)
RBC: 4.06 x10E6/uL (ref 3.77–5.28)
RDW: 12.4 % (ref 11.7–15.4)
WBC: 12 10*3/uL — ABNORMAL HIGH (ref 3.4–10.8)

## 2023-09-25 LAB — LIPID PANEL W/O CHOL/HDL RATIO
Cholesterol, Total: 123 mg/dL (ref 100–199)
HDL: 37 mg/dL — ABNORMAL LOW (ref >39)
LDL Chol Calc (NIH): 42 mg/dL (ref 0–99)
Triglycerides: 285 mg/dL — ABNORMAL HIGH (ref 0–149)
VLDL Cholesterol Cal: 44 mg/dL — ABNORMAL HIGH (ref 5–40)

## 2023-09-25 LAB — CMP14+EGFR
ALT: 9 IU/L (ref 0–32)
AST: 12 IU/L (ref 0–40)
Albumin: 4.7 g/dL (ref 3.9–4.9)
Alkaline Phosphatase: 240 IU/L — ABNORMAL HIGH (ref 44–121)
BUN/Creatinine Ratio: 13 (ref 9–23)
BUN: 17 mg/dL (ref 6–24)
Bilirubin Total: <0.2 mg/dL (ref 0.0–1.2)
CO2: 18 mmol/L — ABNORMAL LOW (ref 20–29)
Calcium: 10.1 mg/dL (ref 8.7–10.2)
Chloride: 101 mmol/L (ref 96–106)
Creatinine, Ser: 1.33 mg/dL — ABNORMAL HIGH (ref 0.57–1.00)
Globulin, Total: 3.6 g/dL (ref 1.5–4.5)
Glucose: 120 mg/dL — ABNORMAL HIGH (ref 70–99)
Potassium: 3.8 mmol/L (ref 3.5–5.2)
Sodium: 142 mmol/L (ref 134–144)
Total Protein: 8.3 g/dL (ref 6.0–8.5)
eGFR: 49 mL/min/{1.73_m2} — ABNORMAL LOW (ref >59)

## 2023-09-25 LAB — VITAMIN B12

## 2023-09-25 LAB — VITAMIN D 25 HYDROXY (VIT D DEFICIENCY, FRACTURES): Vit D, 25-Hydroxy: 15 ng/mL — ABNORMAL LOW (ref 30.0–100.0)

## 2023-09-25 LAB — HEMOGLOBIN A1C
Est. average glucose Bld gHb Est-mCnc: 128 mg/dL
Hgb A1c MFr Bld: 6.1 % — ABNORMAL HIGH (ref 4.8–5.6)

## 2023-09-27 ENCOUNTER — Other Ambulatory Visit: Payer: Medicaid Other

## 2023-10-02 NOTE — Progress Notes (Signed)
 Patient notified

## 2023-10-04 ENCOUNTER — Ambulatory Visit: Payer: Medicaid Other | Admitting: Cardiovascular Disease

## 2023-10-08 ENCOUNTER — Ambulatory Visit: Admitting: Internal Medicine

## 2023-10-08 ENCOUNTER — Encounter: Payer: Self-pay | Admitting: Internal Medicine

## 2023-10-08 VITALS — BP 110/74 | HR 103 | Ht 65.0 in | Wt 228.8 lb

## 2023-10-08 DIAGNOSIS — E782 Mixed hyperlipidemia: Secondary | ICD-10-CM

## 2023-10-08 DIAGNOSIS — E559 Vitamin D deficiency, unspecified: Secondary | ICD-10-CM

## 2023-10-08 DIAGNOSIS — E538 Deficiency of other specified B group vitamins: Secondary | ICD-10-CM | POA: Diagnosis not present

## 2023-10-08 DIAGNOSIS — I10 Essential (primary) hypertension: Secondary | ICD-10-CM

## 2023-10-08 DIAGNOSIS — R748 Abnormal levels of other serum enzymes: Secondary | ICD-10-CM

## 2023-10-08 DIAGNOSIS — F411 Generalized anxiety disorder: Secondary | ICD-10-CM

## 2023-10-08 DIAGNOSIS — I420 Dilated cardiomyopathy: Secondary | ICD-10-CM

## 2023-10-08 MED ORDER — FENOFIBRATE 145 MG PO TABS: 145.0000 mg | ORAL_TABLET | Freq: Every day | ORAL | 3 refills | Status: DC

## 2023-10-08 MED ORDER — VITAMIN D3 1.25 MG (50000 UT) PO CAPS: 1.0000 | ORAL_CAPSULE | ORAL | 3 refills | Status: DC

## 2023-10-08 NOTE — Progress Notes (Signed)
 Established Patient Office Visit  Subjective:  Patient ID: Grace Fox, female    DOB: 04/24/1971  Age: 53 y.o. MRN: [id number]  Chief Complaint  Patient presents with   Follow-up    Discuss lab results    Patient comes in to discuss her lab results which had several abnormal results, and to adjust her medications.   Her triglycerides are very high while she is taking Lopid 600 mg twice a day.  Although her LDL is under good control with pravastatin. Will stop Lopid and switch to Tricor. Alkaline phosphatase has jumped up-she is status postcholecystectomy for gallstones.  Will schedule an abdominal ultrasound. Vitamin D levels are extremely low.  Patient reports that she used to be on a supplement but then stopped.  Will send in a new refill. Still needs to get a vitamin B12 level, somehow got canceled from previous blood work.  Will get it done today.  Patient also used to take B12 injections at home.  Will check the results and then send in a refill.    No other concerns at this time.   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   Oxygen deficit    2L/ HS   Pneumonia    PONV (postoperative nausea and vomiting)    with GA   Thyroid disease    Vertigo     Past Surgical History:  Procedure Laterality Date   CHOLECYSTECTOMY     CHOLECYSTECTOMY N/A 02/28/2018   Procedure: LAPAROSCOPIC CHOLECYSTECTOMY WITH INTRAOPERATIVE CHOLANGIOGRAM;  Surgeon: Griselda Miner, MD;  Location: [facility];  Service:  General;  Laterality: N/A;   LEEP N/A 02/16/2019   Procedure: LOOP ELECTROSURGICAL EXCISION PROCEDURE (LEEP);  Surgeon: Linzie Collin, MD;  Location: [facility];  Service: Gynecology;  Laterality: N/A;   LEFT HEART CATH AND CORONARY ANGIOGRAPHY Left 04/14/2020   Procedure: LEFT HEART CATH AND CORONARY ANGIOGRAPHY;  Surgeon: Laurier Nancy, MD;  Location: [facility];  Service: Cardiovascular;  Laterality: Left;    Social History   Socioeconomic History   Marital status: Divorced    Spouse name: Not on file   Number of children: Not on file   Years of education: Not on file   Highest education level: Not on file  Occupational History   Not on file  Tobacco Use   Smoking status: Former    Current packs/day: 0.00    Average packs/day: 1 pack/day for 20.0 years (20.0 ttl pk-yrs)    Types: Cigarettes    Start date: 02/06/2001    Quit date: 02/06/2021    Years since quitting: 2.6   Smokeless tobacco: Never  Vaping Use   Vaping status: Some Days  Substance and Sexual Activity   Alcohol use: No   Drug use: No   Sexual activity: Yes    Birth  control/protection: I.U.D.  Other Topics Concern   Not on file  Social History Narrative   Not on file   Social Drivers of Health   Financial Resource Strain: Not on file  Food Insecurity: Not on file  Transportation Needs: Not on file  Physical Activity: Not on file  Stress: Not on file  Social Connections: Not on file  Intimate Partner Violence: Not on file    Family History  Problem Relation Age of Onset   Diabetes Mother    Thyroid disease Mother    Heart failure Mother    Diabetes Father    Heart failure Father    Lung cancer Father    Heart failure Brother    Diabetes Maternal Grandmother    Emphysema Maternal Grandmother    Hypertension Maternal Grandfather    Stroke Maternal Grandfather    Heart attack Maternal Grandfather    Emphysema Paternal Grandmother    Cancer Paternal Grandmother    Diabetes Other     Heart failure Other    Breast cancer Neg Hx     Allergies  Allergen Reactions   Lactose Intolerance (Gi) Other (See Comments)    Gi upset   Other Other (See Comments)    Grass- stuffy nose/itching/sneezing Pollen- stuffy nose/itching/sneezing    Outpatient Medications Prior to Visit  Medication Sig   ACCRUFER 30 MG CAPS Take 1 capsule by mouth daily.   ALPRAZolam (XANAX) 1 MG tablet Take 1 mg by mouth 3 (three) times daily as needed for anxiety.    buPROPion (WELLBUTRIN XL) 300 MG 24 hr tablet Take 300 mg by mouth daily.    Cetirizine HCl (ZYRTEC ALLERGY) 10 MG CAPS Take 10 mg by mouth daily.   dapagliflozin propanediol (FARXIGA) 10 MG TABS tablet Take 1 tablet (10 mg total) by mouth daily.   divalproex (DEPAKOTE) 500 MG DR tablet Take 500 mg by mouth at bedtime.   ezetimibe (ZETIA) 10 MG tablet TAKE 1 TABLET BY MOUTH DAILY   famotidine (PEPCID) 20 MG tablet Take 20 mg by mouth 2 (two) times daily.   folic acid (FOLVITE) 1 MG tablet Take 1 tablet (1 mg total) by mouth daily.   furosemide (LASIX) 20 MG tablet TAKE 1 TABLET BY MOUTH DAILY   levothyroxine (SYNTHROID) 50 MCG tablet Take 1 tablet (50 mcg total) by mouth daily before breakfast.   lipase/protease/amylase (CREON) 12000-38000 units CPEP capsule Take 36,000 Units by mouth.   losartan (COZAAR) 25 MG tablet Take 0.5 tablets (12.5 mg total) by mouth daily.   meclizine (ANTIVERT) 25 MG tablet TAKE 1 TABLET BY MOUTH TWICE DAILY   metFORMIN (GLUCOPHAGE-XR) 500 MG 24 hr tablet Take 1 tablet (500 mg total) by mouth daily.   metoprolol succinate (TOPROL-XL) 25 MG 24 hr tablet Take 1 tablet (25 mg total) by mouth daily.   OXYGEN Inhale 2 L into the lungs at bedtime.   pravastatin (PRAVACHOL) 40 MG tablet TAKE 1 TABLET BY MOUTH DAILY   spironolactone (ALDACTONE) 25 MG tablet TAKE 1/2 TABLET BY MOUTH DAILY   SYMBICORT 160-4.5 MCG/ACT inhaler INHALE TWO (2) PUFFS BY MOUTH EVERY 12 HOURS   VENTOLIN HFA 108 (90 Base) MCG/ACT inhaler INHALE  2 PUFFS BY MOUTH EVERY 4 HOURS AS NEEDED FOR SHORTNESS OF BREATH OR WHEEZING   [DISCONTINUED] gemfibrozil (LOPID) 600 MG tablet TAKE 1 TABLET BY MOUTH TWICE DAILY   Cyanocobalamin 1000 MCG/ML KIT Inject 1 Dose as directed every 30 (thirty) days. AT HOME (Patient not taking: Reported on 10/08/2023)  Facility-Administered Medications Prior to Visit  Medication Dose Route Frequency Provider   sodium chloride flush (NS) 0.9 % injection 3 mL  3 mL Intravenous Q12H Laurier Nancy, MD    Review of Systems  Constitutional: Negative.  Negative for chills, fever, malaise/fatigue and weight loss.  HENT: Negative.  Negative for ear discharge and sore throat.   Eyes: Negative.   Respiratory: Negative.  Negative for cough and shortness of breath.   Cardiovascular: Negative.  Negative for chest pain, palpitations and leg swelling.  Gastrointestinal: Negative.  Negative for abdominal pain, constipation, diarrhea, heartburn, nausea and vomiting.  Genitourinary: Negative.  Negative for dysuria and flank pain.  Musculoskeletal: Negative.  Negative for joint pain and myalgias.  Skin: Negative.   Neurological: Negative.  Negative for dizziness, tingling, tremors and headaches.  Endo/Heme/Allergies: Negative.   Psychiatric/Behavioral: Negative.  Negative for depression and suicidal ideas. The patient is not nervous/anxious.        Objective:   BP 110/74   Pulse (!) 103   Ht 5\' 5"  (1.651 m)   Wt 228 lb 12.8 oz (103.8 kg)   SpO2 99%   BMI 38.07 kg/m   Vitals:   10/08/23 1314  BP: 110/74  Pulse: (!) 103  Height: 5\' 5"  (1.651 m)  Weight: 228 lb 12.8 oz (103.8 kg)  SpO2: 99%  BMI (Calculated): 38.07    Physical Exam Vitals and nursing note reviewed.  Constitutional:      Appearance: Normal appearance.  HENT:     Head: Normocephalic and atraumatic.     Nose: Nose normal.     Mouth/Throat:     Mouth: Mucous membranes are moist.     Pharynx: Oropharynx is clear.  Eyes:      Conjunctiva/sclera: Conjunctivae normal.     Pupils: Pupils are equal, round, and reactive to light.  Cardiovascular:     Rate and Rhythm: Normal rate and regular rhythm.     Pulses: Normal pulses.     Heart sounds: Normal heart sounds. No murmur heard. Pulmonary:     Effort: Pulmonary effort is normal.     Breath sounds: Normal breath sounds. No wheezing.  Abdominal:     General: Bowel sounds are normal.     Palpations: Abdomen is soft.     Tenderness: There is no abdominal tenderness. There is no right CVA tenderness or left CVA tenderness.  Musculoskeletal:        General: Normal range of motion.     Cervical back: Normal range of motion.     Right lower leg: No edema.     Left lower leg: No edema.  Skin:    General: Skin is warm and dry.  Neurological:     General: No focal deficit present.     Mental Status: She is alert and oriented to person, place, and time.  Psychiatric:        Mood and Affect: Mood normal.        Behavior: Behavior normal.      No results found for any visits on 10/08/23.  Recent Results (from the past 2160 hours)  Cytology - PAP     Status: Abnormal   Collection Time: 08/08/23  2:37 PM  Result Value Ref Range   High risk HPV Negative    Adequacy      Satisfactory for evaluation; transformation zone component PRESENT.   Diagnosis (A)     - Atypical squamous cells of undetermined significance (ASC-US)   Comment Normal Reference  Range HPV - Negative   CMP14+EGFR     Status: Abnormal   Collection Time: 09/23/23  2:50 PM  Result Value Ref Range   Glucose 120 (H) 70 - 99 mg/dL   BUN 17 6 - 24 mg/dL   Creatinine, Ser 2.53 (H) 0.57 - 1.00 mg/dL   eGFR 49 (L) >66 YQ/IHK/7.42   BUN/Creatinine Ratio 13 9 - 23   Sodium 142 134 - 144 mmol/L   Potassium 3.8 3.5 - 5.2 mmol/L   Chloride 101 96 - 106 mmol/L   CO2 18 (L) 20 - 29 mmol/L   Calcium 10.1 8.7 - 10.2 mg/dL   Total Protein 8.3 6.0 - 8.5 g/dL   Albumin 4.7 3.9 - 4.9 g/dL   Globulin, Total  3.6 1.5 - 4.5 g/dL   Bilirubin Total <5.9 0.0 - 1.2 mg/dL   Alkaline Phosphatase 240 (H) 44 - 121 IU/L   AST 12 0 - 40 IU/L   ALT 9 0 - 32 IU/L  CBC with Diff     Status: Abnormal   Collection Time: 09/23/23  2:50 PM  Result Value Ref Range   WBC 12.0 (H) 3.4 - 10.8 x10E3/uL   RBC 4.06 3.77 - 5.28 x10E6/uL   Hemoglobin 12.9 11.1 - 15.9 g/dL   Hematocrit 56.3 87.5 - 46.6 %   MCV 94 79 - 97 fL   MCH 31.8 26.6 - 33.0 pg   MCHC 33.8 31.5 - 35.7 g/dL   RDW 64.3 32.9 - 51.8 %   Platelets 448 150 - 450 x10E3/uL   Neutrophils 65 Not Estab. %   Lymphs 23 Not Estab. %   Monocytes 8 Not Estab. %   Eos 2 Not Estab. %   Basos 1 Not Estab. %   Neutrophils Absolute 7.6 (H) 1.4 - 7.0 x10E3/uL   Lymphocytes Absolute 2.8 0.7 - 3.1 x10E3/uL   Monocytes Absolute 1.0 (H) 0.1 - 0.9 x10E3/uL   EOS (ABSOLUTE) 0.3 0.0 - 0.4 x10E3/uL   Basophils Absolute 0.1 0.0 - 0.2 x10E3/uL   Immature Granulocytes 1 Not Estab. %   Immature Grans (Abs) 0.2 (H) 0.0 - 0.1 x10E3/uL    Comment: (An elevated percentage of Immature Granulocytes has not been found to be clinically significant as a sole clinical predictor of disease. Does NOT include bands or blast cells.  Pregnancy associated physiological leukocytosis may also show increased immature granulocytes without clinical significance.)   Lipid Panel w/o Chol/HDL Ratio     Status: Abnormal   Collection Time: 09/23/23  2:50 PM  Result Value Ref Range   Cholesterol, Total 123 100 - 199 mg/dL   Triglycerides 841 (H) 0 - 149 mg/dL   HDL 37 (L) >66 mg/dL   VLDL Cholesterol Cal 44 (H) 5 - 40 mg/dL   LDL Chol Calc (NIH) 42 0 - 99 mg/dL  Vitamin A63     Status: None   Collection Time: 09/23/23  2:50 PM  Result Value Ref Range   Vitamin B-12 CANCELED pg/mL    Comment: [facility] was unable to collect sufficient specimen to perform the following test(s), and is providing the patient with re-collection instructions.  Result canceled by the ancillary.   Vitamin D (25  hydroxy)     Status: Abnormal   Collection Time: 09/23/23  2:50 PM  Result Value Ref Range   Vit D, 25-Hydroxy 15.0 (L) 30.0 - 100.0 ng/mL    Comment: Vitamin D deficiency has been defined by the Institute of Medicine and an Endocrine  Society practice guideline as a level of serum 25-OH vitamin D less than 20 ng/mL (1,2). The Endocrine Society went on to further define vitamin D insufficiency as a level between 21 and 29 ng/mL (2). 1. IOM (Institute of Medicine). 2010. Dietary reference    intakes for calcium and D. [place]: The    [employer]. 2. Holick MF, Binkley M[place]ton, Bischoff-Ferrari HA, et al.    Evaluation, treatment, and prevention of vitamin D    deficiency: an Endocrine Society clinical practice    guideline. JCEM. 2011 Jul; 96(7):1911-30.   Hemoglobin A1c     Status: Abnormal   Collection Time: 09/23/23  2:50 PM  Result Value Ref Range   Hgb A1c MFr Bld 6.1 (H) 4.8 - 5.6 %    Comment:          Prediabetes: 5.7 - 6.4          Diabetes: >6.4          Glycemic control for adults with diabetes: <7.0    Est. average glucose Bld gHb Est-mCnc 128 mg/dL      Assessment & Plan:  To proceed as per following orders patient advised to keep her follow-up appointment.  To continue other medications. Problem List Items Addressed This Visit     Dilated cardiomyopathy (HCC)   Relevant Medications   fenofibrate (TRICOR) 145 MG tablet   Essential hypertension, benign - Primary   Relevant Medications   fenofibrate (TRICOR) 145 MG tablet   Other Visit Diagnoses       Vitamin B12 deficiency       Relevant Orders   Vitamin B12     Mixed hyperlipidemia       Relevant Medications   fenofibrate (TRICOR) 145 MG tablet     Vitamin D deficiency       Relevant Medications   Cholecalciferol (VITAMIN D3) 1.25 MG (50000 UT) CAPS     GAD (generalized anxiety disorder)         Elevated liver enzymes       Relevant Orders   US Abdomen Complete       Follow up as  scheduled.  Total time spent: 30 minutes  Margaretann Loveless, MD  10/08/2023   This document may have been prepared by [facility] Voice Recognition software and as such may include unintentional dictation errors.

## 2023-10-14 ENCOUNTER — Ambulatory Visit

## 2023-11-01 ENCOUNTER — Ambulatory Visit (INDEPENDENT_AMBULATORY_CARE_PROVIDER_SITE_OTHER)

## 2023-11-01 DIAGNOSIS — I351 Nonrheumatic aortic (valve) insufficiency: Secondary | ICD-10-CM

## 2023-11-01 DIAGNOSIS — I5033 Acute on chronic diastolic (congestive) heart failure: Secondary | ICD-10-CM

## 2023-11-01 DIAGNOSIS — I371 Nonrheumatic pulmonary valve insufficiency: Secondary | ICD-10-CM

## 2023-11-01 DIAGNOSIS — I340 Nonrheumatic mitral (valve) insufficiency: Secondary | ICD-10-CM

## 2023-11-01 DIAGNOSIS — I420 Dilated cardiomyopathy: Secondary | ICD-10-CM

## 2023-11-04 ENCOUNTER — Ambulatory Visit: Admitting: Internal Medicine

## 2023-11-04 NOTE — Progress Notes (Signed)
 Patient called.  Unable to reach patient. Voicemail was full and was unable to leave message.

## 2023-11-07 ENCOUNTER — Ambulatory Visit: Admitting: Cardiovascular Disease

## 2023-11-18 ENCOUNTER — Ambulatory Visit: Admission: RE | Admit: 2023-11-18 | Source: Ambulatory Visit

## 2023-11-21 ENCOUNTER — Other Ambulatory Visit: Payer: Self-pay | Admitting: Cardiovascular Disease

## 2023-11-21 ENCOUNTER — Other Ambulatory Visit: Payer: Self-pay | Admitting: Cardiology

## 2023-12-23 ENCOUNTER — Other Ambulatory Visit: Payer: Self-pay

## 2023-12-23 ENCOUNTER — Other Ambulatory Visit: Payer: Self-pay | Admitting: Cardiovascular Disease

## 2023-12-23 ENCOUNTER — Other Ambulatory Visit: Payer: Self-pay | Admitting: Internal Medicine

## 2023-12-23 ENCOUNTER — Inpatient Hospital Stay: Payer: Medicaid Other

## 2023-12-23 DIAGNOSIS — E782 Mixed hyperlipidemia: Secondary | ICD-10-CM

## 2023-12-23 DIAGNOSIS — N183 Anemia in chronic kidney disease: Secondary | ICD-10-CM

## 2023-12-24 ENCOUNTER — Inpatient Hospital Stay: Attending: Oncology

## 2023-12-24 DIAGNOSIS — N183 Chronic kidney disease, stage 3 unspecified: Secondary | ICD-10-CM | POA: Diagnosis present

## 2023-12-24 DIAGNOSIS — D649 Anemia, unspecified: Secondary | ICD-10-CM

## 2023-12-24 DIAGNOSIS — D631 Anemia in chronic kidney disease: Secondary | ICD-10-CM | POA: Insufficient documentation

## 2023-12-24 LAB — CBC WITH DIFFERENTIAL (CANCER CENTER ONLY)
Abs Immature Granulocytes: 0.13 10*3/uL — ABNORMAL HIGH (ref 0.00–0.07)
Basophils Absolute: 0.1 10*3/uL (ref 0.0–0.1)
Basophils Relative: 1 %
Eosinophils Absolute: 0.2 10*3/uL (ref 0.0–0.5)
Eosinophils Relative: 2 %
HCT: 34.5 % — ABNORMAL LOW (ref 36.0–46.0)
Hemoglobin: 11.1 g/dL — ABNORMAL LOW (ref 12.0–15.0)
Immature Granulocytes: 1 %
Lymphocytes Relative: 23 %
Lymphs Abs: 2.2 10*3/uL (ref 0.7–4.0)
MCH: 30.2 pg (ref 26.0–34.0)
MCHC: 32.2 g/dL (ref 30.0–36.0)
MCV: 93.8 fL (ref 80.0–100.0)
Monocytes Absolute: 0.5 10*3/uL (ref 0.1–1.0)
Monocytes Relative: 5 %
Neutro Abs: 6.5 10*3/uL (ref 1.7–7.7)
Neutrophils Relative %: 68 %
Platelet Count: 367 10*3/uL (ref 150–400)
RBC: 3.68 MIL/uL — ABNORMAL LOW (ref 3.87–5.11)
RDW: 13.2 % (ref 11.5–15.5)
WBC Count: 9.6 10*3/uL (ref 4.0–10.5)
nRBC: 0 % (ref 0.0–0.2)

## 2023-12-24 LAB — CMP (CANCER CENTER ONLY)
ALT: 26 U/L (ref 0–44)
AST: 29 U/L (ref 15–41)
Albumin: 3.7 g/dL (ref 3.5–5.0)
Alkaline Phosphatase: 67 U/L (ref 38–126)
Anion gap: 9 (ref 5–15)
BUN: 23 mg/dL — ABNORMAL HIGH (ref 6–20)
CO2: 25 mmol/L (ref 22–32)
Calcium: 8.8 mg/dL — ABNORMAL LOW (ref 8.9–10.3)
Chloride: 103 mmol/L (ref 98–111)
Creatinine: 1.82 mg/dL — ABNORMAL HIGH (ref 0.44–1.00)
GFR, Estimated: 33 mL/min — ABNORMAL LOW (ref >60)
Glucose, Bld: 212 mg/dL — ABNORMAL HIGH (ref 70–99)
Potassium: 3.4 mmol/L — ABNORMAL LOW (ref 3.5–5.1)
Sodium: 137 mmol/L (ref 135–145)
Total Bilirubin: 0.5 mg/dL (ref 0.0–1.2)
Total Protein: 7.5 g/dL (ref 6.5–8.1)

## 2023-12-24 LAB — IRON AND TIBC
Iron: 69 ug/dL (ref 28–170)
Saturation Ratios: 14 % (ref 10.4–31.8)
TIBC: 487 ug/dL — ABNORMAL HIGH (ref 250–450)
UIBC: 418 ug/dL

## 2023-12-24 LAB — FERRITIN: Ferritin: 183 ng/mL (ref 11–307)

## 2024-01-02 ENCOUNTER — Ambulatory Visit: Payer: Self-pay | Admitting: Internal Medicine

## 2024-01-02 ENCOUNTER — Encounter: Payer: Self-pay | Admitting: Internal Medicine

## 2024-02-26 ENCOUNTER — Other Ambulatory Visit: Payer: Self-pay | Admitting: Internal Medicine

## 2024-02-26 DIAGNOSIS — I5033 Acute on chronic diastolic (congestive) heart failure: Secondary | ICD-10-CM

## 2024-03-28 ENCOUNTER — Other Ambulatory Visit: Payer: Self-pay | Admitting: Internal Medicine

## 2024-03-28 DIAGNOSIS — I5033 Acute on chronic diastolic (congestive) heart failure: Secondary | ICD-10-CM

## 2024-03-31 ENCOUNTER — Ambulatory Visit (INDEPENDENT_AMBULATORY_CARE_PROVIDER_SITE_OTHER): Admitting: Internal Medicine

## 2024-03-31 ENCOUNTER — Encounter: Payer: Self-pay | Admitting: Internal Medicine

## 2024-03-31 VITALS — BP 100/68 | HR 88 | Ht 65.0 in | Wt 235.0 lb

## 2024-03-31 DIAGNOSIS — E559 Vitamin D deficiency, unspecified: Secondary | ICD-10-CM

## 2024-03-31 DIAGNOSIS — E538 Deficiency of other specified B group vitamins: Secondary | ICD-10-CM

## 2024-03-31 DIAGNOSIS — I10 Essential (primary) hypertension: Secondary | ICD-10-CM

## 2024-03-31 DIAGNOSIS — I340 Nonrheumatic mitral (valve) insufficiency: Secondary | ICD-10-CM | POA: Diagnosis not present

## 2024-03-31 DIAGNOSIS — D638 Anemia in other chronic diseases classified elsewhere: Secondary | ICD-10-CM

## 2024-03-31 DIAGNOSIS — R7309 Other abnormal glucose: Secondary | ICD-10-CM

## 2024-03-31 DIAGNOSIS — I420 Dilated cardiomyopathy: Secondary | ICD-10-CM

## 2024-03-31 DIAGNOSIS — Z1231 Encounter for screening mammogram for malignant neoplasm of breast: Secondary | ICD-10-CM

## 2024-03-31 DIAGNOSIS — N183 Chronic kidney disease, stage 3 unspecified: Secondary | ICD-10-CM

## 2024-03-31 DIAGNOSIS — E782 Mixed hyperlipidemia: Secondary | ICD-10-CM

## 2024-03-31 DIAGNOSIS — D631 Anemia in chronic kidney disease: Secondary | ICD-10-CM

## 2024-03-31 DIAGNOSIS — F411 Generalized anxiety disorder: Secondary | ICD-10-CM

## 2024-03-31 DIAGNOSIS — I5033 Acute on chronic diastolic (congestive) heart failure: Secondary | ICD-10-CM

## 2024-03-31 DIAGNOSIS — Z87891 Personal history of nicotine dependence: Secondary | ICD-10-CM

## 2024-03-31 NOTE — Progress Notes (Signed)
 Established Patient Office Visit  Subjective:  Patient ID: Grace Fox, female    DOB: 12/25/1970  Age: 53 y.o. MRN: [id number]  Chief Complaint  Patient presents with   Follow-up    6 month follow up    Patient comes in for follow up. Feels tired but has been busy with family health issues. Overdue for mammogram- will schedule. Under care of ob/gyn for pap. Needs labs today. Patient quit smoking 2 years ago after being a heavy smoker for several years. Will schedule lung cancer screening as well. Missed colonoscopy? that was scheduled by her ob/gyn earlier this year. Will reschedule at next follow up.    No other concerns at this time.   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   Oxygen deficit    2L/ HS   Pneumonia    PONV (postoperative nausea and vomiting)    with GA   Thyroid  disease    Vertigo     Past Surgical History:  Procedure Laterality Date   CHOLECYSTECTOMY     CHOLECYSTECTOMY N/A 02/28/2018   Procedure: LAPAROSCOPIC CHOLECYSTECTOMY WITH INTRAOPERATIVE CHOLANGIOGRAM;  Surgeon: Curvin Deward MOULD, MD;  Location: [facility] OR[facility];  Service: General;  Laterality: N/A;   LEEP N/A 02/16/2019   Procedure: LOOP ELECTROSURGICAL EXCISION PROCEDURE (LEEP);  Surgeon: Janit Alm Agent, MD;  Location: [facility];  Service: Gynecology;  Laterality: N/A;   LEFT HEART CATH AND CORONARY ANGIOGRAPHY Left 04/14/2020   Procedure: LEFT HEART CATH AND CORONARY ANGIOGRAPHY;  Surgeon: Fernand DENYSE LABOR, MD;   Location: [facility];  Service: Cardiovascular;  Laterality: Left;    Social History   Socioeconomic History   Marital status: Divorced    Spouse name: Not on file   Number of children: Not on file   Years of education: Not on file   Highest education level: Not on file  Occupational History   Not on file  Tobacco Use   Smoking status: Former    Current packs/day: 0.00    Average packs/day: 1 pack/day for 20.0 years (20.0 ttl pk-yrs)    Types: Cigarettes    Start date: 02/06/2001    Quit date: 02/06/2021    Years since quitting: 3.1   Smokeless tobacco: Never  Vaping Use   Vaping status: Some Days  Substance and Sexual Activity   Alcohol use: No   Drug use: No   Sexual activity: Yes    Birth control/protection: I.U.D.  Other Topics Concern   Not on file  Social History Narrative   Not on file   Social Drivers of Health   Financial Resource Strain: Not on file  Food Insecurity: Not on file  Transportation Needs: Not on file  Physical Activity: Not on file  Stress: Not on file  Social Connections: Not on file  Intimate Partner Violence: Not  on file    Family History  Problem Relation Age of Onset   Diabetes Mother    Thyroid  disease Mother    Heart failure Mother    Diabetes Father    Heart failure Father    Lung cancer Father    Heart failure Brother    Diabetes Maternal Grandmother    Emphysema Maternal Grandmother    Hypertension Maternal Grandfather    Stroke Maternal Grandfather    Heart attack Maternal Grandfather    Emphysema Paternal Grandmother    Cancer Paternal Grandmother    Diabetes Other    Heart failure Other    Breast cancer Neg Hx     Allergies  Allergen Reactions   Lactose Intolerance (Gi) Other (See Comments)    Gi upset   Other Other (See Comments)    Grass- stuffy nose/itching/sneezing Pollen- stuffy nose/itching/sneezing    Outpatient Medications Prior to Visit  Medication Sig   ACCRUFER  30 MG CAPS TAKE ONE (1)  CAPSULE BY MOUTH ONCE DAILY   ALPRAZolam (XANAX) 1 MG tablet Take 1 mg by mouth 3 (three) times daily as needed for anxiety.    buPROPion  (WELLBUTRIN  XL) 300 MG 24 hr tablet Take 300 mg by mouth daily.    Cetirizine HCl (ZYRTEC ALLERGY) 10 MG CAPS Take 10 mg by mouth daily.   Cholecalciferol (VITAMIN D3) 1.25 MG (50000 UT) CAPS Take 1 capsule (1.25 mg total) by mouth once a week.   dapagliflozin  propanediol (FARXIGA ) 10 MG TABS tablet Take 1 tablet by mouth once daily   divalproex (DEPAKOTE) 500 MG DR tablet Take 500 mg by mouth at bedtime.   ezetimibe  (ZETIA ) 10 MG tablet TAKE 1 TABLET BY MOUTH DAILY   famotidine  (PEPCID ) 20 MG tablet Take 20 mg by mouth 2 (two) times daily.   fenofibrate  (TRICOR ) 145 MG tablet TAKE 1 TABLET BY MOUTH ONCE DAILY   folic acid  (FOLVITE ) 1 MG tablet Take 1 tablet (1 mg total) by mouth daily.   furosemide  (LASIX ) 20 MG tablet TAKE 1 TABLET BY MOUTH DAILY   levothyroxine  (SYNTHROID ) 50 MCG tablet Take 1 tablet (50 mcg total) by mouth daily before breakfast.   lipase/protease/amylase (CREON) 12000-38000 units CPEP capsule Take 36,000 Units by mouth.   losartan  (COZAAR ) 25 MG tablet TAKE 1/2 TABLET BY MOUTH ONCE DAILY   meclizine  (ANTIVERT ) 25 MG tablet TAKE 1 TABLET BY MOUTH TWICE DAILY   metFORMIN  (GLUCOPHAGE -XR) 500 MG 24 hr tablet Take 1 tablet (500 mg total) by mouth daily.   metoprolol  succinate (TOPROL -XL) 25 MG 24 hr tablet Take 1 tablet (25 mg total) by mouth daily.   OXYGEN Inhale 2 L into the lungs at bedtime.   pravastatin  (PRAVACHOL ) 40 MG tablet TAKE 1 TABLET BY MOUTH DAILY   spironolactone  (ALDACTONE ) 25 MG tablet TAKE 1/2 TABLET BY MOUTH DAILY   SYMBICORT  160-4.5 MCG/ACT inhaler INHALE TWO (2) PUFFS BY MOUTH EVERY 12 HOURS   VENTOLIN  HFA 108 (90 Base) MCG/ACT inhaler INHALE 2 PUFFS BY MOUTH EVERY 4 HOURS AS NEEDED FOR SHORTNESS OF BREATH OR WHEEZING   Cyanocobalamin 1000 MCG/ML KIT Inject 1 Dose as directed every 30 (thirty) days. AT HOME (Patient not  taking: Reported on 03/31/2024)   Facility-Administered Medications Prior to Visit  Medication Dose Route Frequency Provider   sodium chloride  flush (NS) 0.9 % injection 3 mL  3 mL Intravenous Q12H Fernand Denyse LABOR, MD    Review of Systems  Constitutional: Negative.  Negative for chills, fever and malaise/fatigue.  HENT: Negative.  Negative for congestion and sore throat.   Eyes: Negative.  Negative for blurred vision and pain.  Respiratory: Negative.  Negative for cough and shortness of breath.   Cardiovascular: Negative.  Negative for chest pain, palpitations and leg swelling.  Gastrointestinal: Negative.  Negative for abdominal pain, blood in stool, constipation, diarrhea, heartburn, melena, nausea and vomiting.  Genitourinary: Negative.  Negative for dysuria, flank pain, frequency and urgency.  Musculoskeletal: Negative.  Negative for joint pain and myalgias.  Skin: Negative.   Neurological: Negative.  Negative for dizziness, tingling, sensory change, weakness and headaches.  Endo/Heme/Allergies: Negative.   Psychiatric/Behavioral: Negative.  Negative for depression and suicidal ideas. The patient is not nervous/anxious.        Objective:   BP 100/68   Pulse 88   Ht 5' 5 (1.651 m)   Wt 235 lb (106.6 kg)   SpO2 99%   BMI 39.11 kg/m   Vitals:   03/31/24 1324  BP: 100/68  Pulse: 88  Height: 5' 5 (1.651 m)  Weight: 235 lb (106.6 kg)  SpO2: 99%  BMI (Calculated): 39.11    Physical Exam Vitals and nursing note reviewed.  Constitutional:      Appearance: Normal appearance.  HENT:     Head: Normocephalic and atraumatic.     Nose: Nose normal.     Mouth/Throat:     Mouth: Mucous membranes are moist.     Pharynx: Oropharynx is clear.  Eyes:     Conjunctiva/sclera: Conjunctivae normal.     Pupils: Pupils are equal, round, and reactive to light.  Cardiovascular:     Rate and Rhythm: Normal rate and regular rhythm.     Pulses: Normal pulses.     Heart sounds: Normal  heart sounds. No murmur heard. Pulmonary:     Effort: Pulmonary effort is normal.     Breath sounds: Normal breath sounds. No wheezing.  Abdominal:     General: Bowel sounds are normal.     Palpations: Abdomen is soft.     Tenderness: There is no abdominal tenderness. There is no right CVA tenderness or left CVA tenderness.  Musculoskeletal:        General: Normal range of motion.     Cervical back: Normal range of motion.     Right lower leg: No edema.     Left lower leg: No edema.  Skin:    General: Skin is warm and dry.  Neurological:     General: No focal deficit present.     Mental Status: She is alert and oriented to person, place, and time.  Psychiatric:        Mood and Affect: Mood normal.        Behavior: Behavior normal.      No results found for any visits on 03/31/24.  No results found for this or any previous visit (from the past 2160 hours).    Assessment & Plan:  Schedule mammogram, Low dose CT chest. Labs today. To discuss colonoscopy at follow up. Problem List Items Addressed This Visit     CHF (congestive heart failure), NYHA class III, acute on chronic, diastolic (HCC)   Dilated cardiomyopathy (HCC)   Nonrheumatic mitral valve regurgitation   Essential hypertension, benign - Primary   Relevant Orders   CMP14+EGFR   Other Visit Diagnoses       Breast cancer screening by mammogram       Relevant Orders   MM 3D SCREENING MAMMOGRAM BILATERAL BREAST  Mixed hyperlipidemia       Relevant Orders   CMP14+EGFR   Lipid Panel w/o Chol/HDL Ratio     Vitamin B12 deficiency         Vitamin D  deficiency       Relevant Orders   Vitamin D  (25 hydroxy)     GAD (generalized anxiety disorder)         Anemia of chronic disease       Relevant Orders   CBC with Diff     History of smoking at least 1 pack per day for at least 30 years       Relevant Orders   CT CHEST LUNG CA SCREEN LOW DOSE W/O CM     Anemia of chronic kidney failure, stage 3 (moderate)  (HCC)         Elevated glucose level       Relevant Orders   Hemoglobin A1c       Return in about 1 week (around 04/07/2024).   Total time spent: 30 minutes  FERNAND FREDY RAMAN, MD  03/31/2024   This document may have been prepared by [facility] Voice Recognition software and as such may include unintentional dictation errors.

## 2024-04-01 ENCOUNTER — Other Ambulatory Visit

## 2024-04-01 ENCOUNTER — Other Ambulatory Visit: Payer: Self-pay | Admitting: Internal Medicine

## 2024-04-01 DIAGNOSIS — I5033 Acute on chronic diastolic (congestive) heart failure: Secondary | ICD-10-CM

## 2024-04-02 ENCOUNTER — Ambulatory Visit: Admitting: Cardiovascular Disease

## 2024-04-02 ENCOUNTER — Ambulatory Visit: Payer: Self-pay | Admitting: Internal Medicine

## 2024-04-02 ENCOUNTER — Encounter: Payer: Self-pay | Admitting: Cardiovascular Disease

## 2024-04-02 VITALS — BP 121/70 | HR 98 | Ht 65.0 in | Wt 231.0 lb

## 2024-04-02 DIAGNOSIS — I340 Nonrheumatic mitral (valve) insufficiency: Secondary | ICD-10-CM

## 2024-04-02 DIAGNOSIS — I351 Nonrheumatic aortic (valve) insufficiency: Secondary | ICD-10-CM

## 2024-04-02 DIAGNOSIS — I10 Essential (primary) hypertension: Secondary | ICD-10-CM

## 2024-04-02 DIAGNOSIS — I420 Dilated cardiomyopathy: Secondary | ICD-10-CM

## 2024-04-02 DIAGNOSIS — I5033 Acute on chronic diastolic (congestive) heart failure: Secondary | ICD-10-CM | POA: Diagnosis not present

## 2024-04-02 LAB — CMP14+EGFR
ALT: 14 IU/L (ref 0–32)
AST: 21 IU/L (ref 0–40)
Albumin: 4 g/dL (ref 3.8–4.9)
Alkaline Phosphatase: 66 IU/L (ref 49–135)
BUN/Creatinine Ratio: 11 (ref 9–23)
BUN: 24 mg/dL (ref 6–24)
Bilirubin Total: <0.2 mg/dL (ref 0.0–1.2)
CO2: 19 mmol/L — ABNORMAL LOW (ref 20–29)
Calcium: 9.4 mg/dL (ref 8.7–10.2)
Chloride: 100 mmol/L (ref 96–106)
Creatinine, Ser: 2.12 mg/dL — ABNORMAL HIGH (ref 0.57–1.00)
Globulin, Total: 2.6 g/dL (ref 1.5–4.5)
Glucose: 124 mg/dL — ABNORMAL HIGH (ref 70–99)
Potassium: 3.8 mmol/L (ref 3.5–5.2)
Sodium: 140 mmol/L (ref 134–144)
Total Protein: 6.6 g/dL (ref 6.0–8.5)
eGFR: 28 mL/min/1.73 — ABNORMAL LOW (ref >59)

## 2024-04-02 LAB — VITAMIN D 25 HYDROXY (VIT D DEFICIENCY, FRACTURES): Vit D, 25-Hydroxy: 64.9 ng/mL (ref 30.0–100.0)

## 2024-04-02 LAB — CBC WITH DIFFERENTIAL/PLATELET
Basophils Absolute: 0.1 x10E3/uL (ref 0.0–0.2)
Basos: 1 %
EOS (ABSOLUTE): 0.2 x10E3/uL (ref 0.0–0.4)
Eos: 3 %
Hematocrit: 35.1 % (ref 34.0–46.6)
Hemoglobin: 11.4 g/dL (ref 11.1–15.9)
Immature Grans (Abs): 0.1 x10E3/uL (ref 0.0–0.1)
Immature Granulocytes: 1 %
Lymphocytes Absolute: 2.9 x10E3/uL (ref 0.7–3.1)
Lymphs: 36 %
MCH: 31.7 pg (ref 26.6–33.0)
MCHC: 32.5 g/dL (ref 31.5–35.7)
MCV: 98 fL — ABNORMAL HIGH (ref 79–97)
Monocytes Absolute: 0.7 x10E3/uL (ref 0.1–0.9)
Monocytes: 8 %
Neutrophils Absolute: 4.2 x10E3/uL (ref 1.4–7.0)
Neutrophils: 51 %
Platelets: 323 x10E3/uL (ref 150–450)
RBC: 3.6 x10E6/uL — ABNORMAL LOW (ref 3.77–5.28)
RDW: 13 % (ref 11.7–15.4)
WBC: 8.2 x10E3/uL (ref 3.4–10.8)

## 2024-04-02 LAB — HEMOGLOBIN A1C
Est. average glucose Bld gHb Est-mCnc: 169 mg/dL
Hgb A1c MFr Bld: 7.5 % — ABNORMAL HIGH (ref 4.8–5.6)

## 2024-04-02 LAB — LIPID PANEL W/O CHOL/HDL RATIO
Cholesterol, Total: 184 mg/dL (ref 100–199)
HDL: 42 mg/dL (ref >39)
LDL Chol Calc (NIH): 87 mg/dL (ref 0–99)
Triglycerides: 332 mg/dL — ABNORMAL HIGH (ref 0–149)
VLDL Cholesterol Cal: 55 mg/dL — ABNORMAL HIGH (ref 5–40)

## 2024-04-02 NOTE — Progress Notes (Signed)
 Cardiology Office Note   Date:  04/02/2024   ID:  Grace Fox, DOB Aug 07, 1970, MRN [id number]  PCP:  Fernand Fredy RAMAN, MD  Cardiologist:  Denyse Fernand, MD      History of Present Illness: Grace Fox is a 53 y.o. female who presents for  Chief Complaint  Patient presents with   Follow-up    6 month checkup    Doing good.      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   Oxygen deficit    2L/ HS   Pneumonia    PONV (postoperative nausea and vomiting)    with GA   Thyroid  disease    Vertigo      Past Surgical History:  Procedure Laterality Date   CHOLECYSTECTOMY     CHOLECYSTECTOMY N/A 02/28/2018   Procedure: LAPAROSCOPIC CHOLECYSTECTOMY WITH INTRAOPERATIVE CHOLANGIOGRAM;  Surgeon: Curvin Deward MOULD, MD;  Location: [facility];  Service: General;  Laterality: N/A;   LEEP N/A 02/16/2019   Procedure: LOOP ELECTROSURGICAL EXCISION PROCEDURE (LEEP);  Surgeon: Janit Alm Agent, MD;  Location: [facility];  Service: Gynecology;  Laterality: N/A;   LEFT HEART CATH AND CORONARY ANGIOGRAPHY Left 04/14/2020   Procedure: LEFT HEART CATH AND CORONARY ANGIOGRAPHY;  Surgeon: Fernand Denyse LABOR, MD;  Location: [facility];  Service: Cardiovascular;  Laterality: Left;     Current Outpatient Medications  Medication Sig Dispense Refill   ACCRUFER  30 MG CAPS TAKE ONE (1) CAPSULE BY MOUTH ONCE DAILY 90 capsule 11   ALPRAZolam (XANAX) 1 MG tablet Take 1 mg by mouth 3 (three) times daily as needed  for anxiety.      buPROPion  (WELLBUTRIN  XL) 300 MG 24 hr tablet Take 300 mg by mouth daily.   2   Cetirizine HCl (ZYRTEC ALLERGY) 10 MG CAPS Take 10 mg by mouth daily.     Cholecalciferol (VITAMIN D3) 1.25 MG (50000 UT) CAPS Take 1 capsule (1.25 mg total) by mouth once a week. 12 capsule 3   dapagliflozin  propanediol (FARXIGA ) 10 MG TABS tablet Take 1 tablet by mouth once daily 30 tablet 3   divalproex (DEPAKOTE) 500 MG DR tablet Take 500 mg by mouth at bedtime. (Patient taking differently: Take 500 mg by mouth 2 (two) times daily.)     ezetimibe  (ZETIA ) 10 MG tablet TAKE 1 TABLET BY MOUTH DAILY 90 tablet 11   famotidine  (PEPCID ) 20 MG tablet Take 20 mg by mouth 2 (two) times daily.     fenofibrate  (TRICOR ) 145 MG tablet TAKE 1 TABLET BY MOUTH ONCE DAILY 90 tablet 2   folic acid  (FOLVITE ) 1 MG tablet Take 1 tablet (1 mg total) by mouth daily. 90 tablet 2   furosemide  (LASIX ) 20 MG tablet TAKE 1 TABLET BY MOUTH DAILY 90 tablet 11   levothyroxine  (SYNTHROID ) 50 MCG tablet  Take 1 tablet (50 mcg total) by mouth daily before breakfast. 90 tablet 1   lipase/protease/amylase (CREON) 12000-38000 units CPEP capsule Take 36,000 Units by mouth.     losartan  (COZAAR ) 25 MG tablet TAKE 1/2 TABLET BY MOUTH ONCE DAILY 45 tablet 11   meclizine  (ANTIVERT ) 25 MG tablet TAKE 1 TABLET BY MOUTH TWICE DAILY 60 tablet 10   metFORMIN  (GLUCOPHAGE -XR) 500 MG 24 hr tablet Take 1 tablet (500 mg total) by mouth daily. 90 tablet 2   metoprolol  succinate (TOPROL -XL) 25 MG 24 hr tablet Take 1 tablet (25 mg total) by mouth daily. 90 tablet 0   OXYGEN Inhale 2 L into the lungs at bedtime.     pravastatin  (PRAVACHOL ) 40 MG tablet TAKE 1 TABLET BY MOUTH DAILY 30 tablet 10   SYMBICORT  160-4.5 MCG/ACT inhaler INHALE TWO (2) PUFFS BY MOUTH EVERY 12 HOURS 10.2 g 10   VENTOLIN  HFA 108 (90 Base) MCG/ACT inhaler INHALE 2 PUFFS BY MOUTH EVERY 4 HOURS AS NEEDED FOR SHORTNESS OF BREATH OR WHEEZING 18 g 10   No current facility-administered  medications for this visit.   Facility-Administered Medications Ordered in Other Visits  Medication Dose Route Frequency Provider Last Rate Last Admin   sodium chloride  flush (NS) 0.9 % injection 3 mL  3 mL Intravenous Q12H Fernand Alter A, MD        Allergies:   Lactose intolerance (gi) and Other    Social History:   reports that she quit smoking about 3 years ago. Her smoking use included cigarettes. She started smoking about 23 years ago. She has a 20 pack-year smoking history. She has never used smokeless tobacco. She reports that she does not drink alcohol and does not use drugs.   Family History:  family history includes Cancer in her paternal grandmother; Diabetes in her father, maternal grandmother, mother, and another family member; Emphysema in her maternal grandmother and paternal grandmother; Heart attack in her maternal grandfather; Heart failure in her brother, father, mother, and another family member; Hypertension in her maternal grandfather; Lung cancer in her father; Stroke in her maternal grandfather; Thyroid  disease in her mother.    ROS:     Review of Systems  Constitutional: Negative.   HENT: Negative.    Eyes: Negative.   Respiratory: Negative.    Gastrointestinal: Negative.   Genitourinary: Negative.   Musculoskeletal: Negative.   Skin: Negative.   Neurological: Negative.   Endo/Heme/Allergies: Negative.   Psychiatric/Behavioral: Negative.    All other systems reviewed and are negative.     All other systems are reviewed and negative.    PHYSICAL EXAM: VS:  BP 121/70   Pulse 98   Ht 5' 5 (1.651 m)   Wt 231 lb (104.8 kg)   SpO2 98%   BMI 38.44 kg/m  , BMI Body mass index is 38.44 kg/m. Last weight:  Wt Readings from Last 3 Encounters:  04/02/24 231 lb (104.8 kg)  03/31/24 235 lb (106.6 kg)  10/08/23 228 lb 12.8 oz (103.8 kg)     Physical Exam Constitutional:      Appearance: Normal appearance.  Cardiovascular:     Rate and Rhythm: Normal  rate and regular rhythm.     Heart sounds: Normal heart sounds.  Pulmonary:     Effort: Pulmonary effort is normal.     Breath sounds: Normal breath sounds.  Musculoskeletal:     Right lower leg: No edema.     Left lower leg: No edema.  Neurological:  Mental Status: She is alert.       EKG:   Recent Labs: 04/01/2024: ALT 14; BUN 24; Creatinine, Ser 2.12; Hemoglobin 11.4; Platelets 323; Potassium 3.8; Sodium 140    Lipid Panel    Component Value Date/Time   CHOL 184 04/01/2024 0809   TRIG 332 (H) 04/01/2024 0809   HDL 42 04/01/2024 0809   LDLCALC 87 04/01/2024 0809      Other studies Reviewed: Additional studies/ records that were reviewed today include:  Review of the above records demonstrates:       No data to display            ASSESSMENT AND PLAN:    ICD-10-CM   1. CHF (congestive heart failure), NYHA class III, acute on chronic, diastolic (HCC)  I50.33    has creat went from 1.8 to 2.2, continue lasix  20, but stop aldactone  as probably causing worsening of CRI. If get SOB, can take lasix  bid PRN.    2. Dilated cardiomyopathy (HCC)  I42.0    continue losartan  and lasix . EF 51.8 %    3. Essential hypertension, benign  I10     4. Nonrheumatic aortic valve insufficiency  I35.1     5. Nonrheumatic mitral valve regurgitation  I34.0    moderate MR/AR, but not SOB. EF 52 %       Problem List Items Addressed This Visit       Cardiovascular and Mediastinum   CHF (congestive heart failure), NYHA class III, acute on chronic, diastolic (HCC) - Primary   Dilated cardiomyopathy (HCC)   Nonrheumatic mitral valve regurgitation   Nonrheumatic aortic valve insufficiency   Essential hypertension, benign       Disposition:   Return in about 1 month (around 05/02/2024).    Total time spent: 30 minutes  Signed,  Denyse Bathe, MD  04/02/2024 11:12 AM    [facility]

## 2024-04-06 NOTE — Progress Notes (Signed)
 Patient notified

## 2024-04-07 ENCOUNTER — Ambulatory Visit: Admitting: Internal Medicine

## 2024-04-07 ENCOUNTER — Encounter: Payer: Self-pay | Admitting: Internal Medicine

## 2024-04-07 VITALS — BP 112/74 | HR 88 | Ht 65.0 in | Wt 233.2 lb

## 2024-04-07 DIAGNOSIS — I5033 Acute on chronic diastolic (congestive) heart failure: Secondary | ICD-10-CM

## 2024-04-07 DIAGNOSIS — N184 Chronic kidney disease, stage 4 (severe): Secondary | ICD-10-CM | POA: Insufficient documentation

## 2024-04-07 DIAGNOSIS — Z23 Encounter for immunization: Secondary | ICD-10-CM

## 2024-04-07 DIAGNOSIS — F411 Generalized anxiety disorder: Secondary | ICD-10-CM | POA: Insufficient documentation

## 2024-04-07 DIAGNOSIS — E1165 Type 2 diabetes mellitus with hyperglycemia: Secondary | ICD-10-CM | POA: Insufficient documentation

## 2024-04-07 DIAGNOSIS — I10 Essential (primary) hypertension: Secondary | ICD-10-CM | POA: Diagnosis not present

## 2024-04-07 DIAGNOSIS — Z1211 Encounter for screening for malignant neoplasm of colon: Secondary | ICD-10-CM

## 2024-04-07 DIAGNOSIS — E038 Other specified hypothyroidism: Secondary | ICD-10-CM | POA: Diagnosis not present

## 2024-04-07 MED ORDER — GLIMEPIRIDE 2 MG PO TABS: 2.0000 mg | ORAL_TABLET | Freq: Two times a day (BID) | ORAL | 6 refills | Status: DC

## 2024-04-07 MED ORDER — LEVOTHYROXINE SODIUM 50 MCG PO TABS: 50.0000 ug | ORAL_TABLET | Freq: Every day | ORAL | 1 refills | Status: DC

## 2024-04-07 NOTE — Addendum Note (Signed)
 Addended by: ORION STABS on: 04/07/2024 02:13 PM   Modules accepted: Orders

## 2024-04-07 NOTE — Progress Notes (Signed)
 Established Patient Office Visit  Subjective:  Patient ID: Grace Fox, female    DOB: June 04, 1971  Age: 53 y.o. MRN: [id number]  Chief Complaint  Patient presents with   Follow-up    1 week follow up    Patient comes in for follow up and to discuss results. Still waiting for mammogram and CT chest scheduling. Admits that she missed her colonoscopy, but would like to try Cologuard. Labs discussed- creatinine is up with low GFR- Metformin  stopped , switch to Glimepride, rx sent. Also to get Glucometer to monitor glucose at home. Needs Thyroid  labs- not taking her Levoxyl  for a while-    No other concerns at this time.   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   Oxygen deficit    2L/ HS   Pneumonia    PONV (postoperative nausea and vomiting)    with GA   Thyroid  disease    Vertigo     Past Surgical History:  Procedure Laterality Date   CHOLECYSTECTOMY     CHOLECYSTECTOMY N/A 02/28/2018   Procedure: LAPAROSCOPIC CHOLECYSTECTOMY WITH INTRAOPERATIVE CHOLANGIOGRAM;  Surgeon: Curvin Deward MOULD, MD;  Location: [facility];  Service: General;  Laterality: N/A;   LEEP N/A 02/16/2019   Procedure: LOOP ELECTROSURGICAL EXCISION PROCEDURE (LEEP);  Surgeon: Janit Alm Agent, MD;  Location: [facility];  Service: Gynecology;  Laterality: N/A;   LEFT HEART CATH AND CORONARY ANGIOGRAPHY Left 04/14/2020   Procedure: LEFT HEART CATH AND CORONARY ANGIOGRAPHY;  Surgeon: Fernand DENYSE LABOR, MD;  Location: [facility];  Service: Cardiovascular;  Laterality: Left;    Social History   Socioeconomic History   Marital status: Divorced    Spouse name: Not on file   Number of children: Not on file   Years of education: Not on file   Highest education level: Not on file  Occupational History   Not on file  Tobacco Use   Smoking status: Former    Current packs/day: 0.00    Average packs/day: 1 pack/day for 20.0 years (20.0 ttl pk-yrs)    Types: Cigarettes    Start date: 02/06/2001    Quit date: 02/06/2021    Years since quitting: 3.1   Smokeless tobacco: Never  Vaping Use   Vaping status: Some Days  Substance and Sexual Activity   Alcohol use: No   Drug use: No   Sexual activity: Yes    Birth control/protection: I.U.D.  Other Topics Concern   Not on file  Social History Narrative   Not on file   Social Drivers of Health   Financial Resource Strain: Not on file  Food Insecurity: Not on file  Transportation Needs: Not on file  Physical Activity: Not on file  Stress: Not on file  Social Connections: Not on file  Intimate Partner Violence: Not on file  Family History  Problem Relation Age of Onset   Diabetes Mother    Thyroid  disease Mother    Heart failure Mother    Diabetes Father    Heart failure Father    Lung cancer Father    Heart failure Brother    Diabetes Maternal Grandmother    Emphysema Maternal Grandmother    Hypertension Maternal Grandfather    Stroke Maternal Grandfather    Heart attack Maternal Grandfather    Emphysema Paternal Grandmother    Cancer Paternal Grandmother    Diabetes Other    Heart failure Other    Breast cancer Neg Hx     Allergies  Allergen Reactions   Lactose Intolerance (Gi) Other (See Comments)    Gi upset   Other Other (See Comments)    Grass- stuffy nose/itching/sneezing Pollen- stuffy nose/itching/sneezing    Outpatient Medications Prior to Visit  Medication Sig   ACCRUFER  30 MG CAPS TAKE ONE (1) CAPSULE BY MOUTH ONCE DAILY    ALPRAZolam (XANAX) 1 MG tablet Take 1 mg by mouth 3 (three) times daily as needed for anxiety.    buPROPion  (WELLBUTRIN  XL) 300 MG 24 hr tablet Take 300 mg by mouth daily.    Cetirizine HCl (ZYRTEC ALLERGY) 10 MG CAPS Take 10 mg by mouth daily.   Cholecalciferol (VITAMIN D3) 1.25 MG (50000 UT) CAPS Take 1 capsule (1.25 mg total) by mouth once a week.   dapagliflozin  propanediol (FARXIGA ) 10 MG TABS tablet Take 1 tablet by mouth once daily   divalproex (DEPAKOTE) 500 MG DR tablet Take 500 mg by mouth at bedtime. (Patient taking differently: Take 500 mg by mouth 2 (two) times daily.)   ezetimibe  (ZETIA ) 10 MG tablet TAKE 1 TABLET BY MOUTH DAILY   famotidine  (PEPCID ) 20 MG tablet Take 20 mg by mouth 2 (two) times daily.   fenofibrate  (TRICOR ) 145 MG tablet TAKE 1 TABLET BY MOUTH ONCE DAILY   folic acid  (FOLVITE ) 1 MG tablet Take 1 tablet (1 mg total) by mouth daily.   furosemide  (LASIX ) 20 MG tablet TAKE 1 TABLET BY MOUTH DAILY   lipase/protease/amylase (CREON) 12000-38000 units CPEP capsule Take 36,000 Units by mouth.   losartan  (COZAAR ) 25 MG tablet TAKE 1/2 TABLET BY MOUTH ONCE DAILY   meclizine  (ANTIVERT ) 25 MG tablet TAKE 1 TABLET BY MOUTH TWICE DAILY   metoprolol  succinate (TOPROL -XL) 25 MG 24 hr tablet Take 1 tablet (25 mg total) by mouth daily.   OXYGEN Inhale 2 L into the lungs at bedtime.   pravastatin  (PRAVACHOL ) 40 MG tablet TAKE 1 TABLET BY MOUTH DAILY   SYMBICORT  160-4.5 MCG/ACT inhaler INHALE TWO (2) PUFFS BY MOUTH EVERY 12 HOURS   VENTOLIN  HFA 108 (90 Base) MCG/ACT inhaler INHALE 2 PUFFS BY MOUTH EVERY 4 HOURS AS NEEDED FOR SHORTNESS OF BREATH OR WHEEZING   [DISCONTINUED] levothyroxine  (SYNTHROID ) 50 MCG tablet Take 1 tablet (50 mcg total) by mouth daily before breakfast.   [DISCONTINUED] metFORMIN  (GLUCOPHAGE -XR) 500 MG 24 hr tablet Take 1 tablet (500 mg total) by mouth daily. (Patient not taking: Reported on 04/07/2024)   Facility-Administered Medications Prior to Visit   Medication Dose Route Frequency Provider   sodium chloride  flush (NS) 0.9 % injection 3 mL  3 mL Intravenous Q12H Fernand Denyse LABOR, MD    Review of Systems  Constitutional: Negative.  Negative for chills, fever and malaise/fatigue.  HENT: Negative.  Negative for congestion and sore throat.   Eyes: Negative.  Negative for blurred vision and pain.  Respiratory: Negative.  Negative for cough and shortness of breath.   Cardiovascular: Negative.  Negative for chest pain, palpitations and leg swelling.  Gastrointestinal: Negative.  Negative for abdominal pain, blood in stool, constipation, diarrhea, heartburn, melena, nausea and vomiting.  Genitourinary: Negative.  Negative for dysuria, flank pain, frequency and urgency.  Musculoskeletal: Negative.  Negative for joint pain and myalgias.  Skin: Negative.   Neurological: Negative.  Negative for dizziness, tingling, sensory change, weakness and headaches.  Endo/Heme/Allergies: Negative.   Psychiatric/Behavioral: Negative.  Negative for depression and suicidal ideas. The patient is not nervous/anxious.        Objective:   BP 112/74   Pulse 88   Ht 5' 5 (1.651 m)   Wt 233 lb 3.2 oz (105.8 kg)   SpO2 99%   BMI 38.81 kg/m   Vitals:   04/07/24 1332  BP: 112/74  Pulse: 88  Height: 5' 5 (1.651 m)  Weight: 233 lb 3.2 oz (105.8 kg)  SpO2: 99%  BMI (Calculated): 38.81    Physical Exam Vitals and nursing note reviewed.  Constitutional:      Appearance: Normal appearance.  HENT:     Head: Normocephalic and atraumatic.     Nose: Nose normal.     Mouth/Throat:     Mouth: Mucous membranes are moist.     Pharynx: Oropharynx is clear.  Eyes:     Conjunctiva/sclera: Conjunctivae normal.     Pupils: Pupils are equal, round, and reactive to light.  Cardiovascular:     Rate and Rhythm: Normal rate and regular rhythm.     Pulses: Normal pulses.     Heart sounds: Normal heart sounds. No murmur heard. Pulmonary:     Effort: Pulmonary  effort is normal.     Breath sounds: Normal breath sounds. No wheezing.  Abdominal:     General: Bowel sounds are normal.     Palpations: Abdomen is soft.     Tenderness: There is no abdominal tenderness. There is no right CVA tenderness or left CVA tenderness.  Musculoskeletal:        General: Normal range of motion.     Cervical back: Normal range of motion.     Right lower leg: No edema.     Left lower leg: No edema.  Skin:    General: Skin is warm and dry.  Neurological:     General: No focal deficit present.     Mental Status: She is alert and oriented to person, place, and time.  Psychiatric:        Mood and Affect: Mood normal.        Behavior: Behavior normal.      No results found for any visits on 04/07/24.  Recent Results (from the past 2160 hours)  CMP14+EGFR     Status: Abnormal   Collection Time: 04/01/24  8:09 AM  Result Value Ref Range   Glucose 124 (H) 70 - 99 mg/dL   BUN 24 6 - 24 mg/dL   Creatinine, Ser 7.87 (H) 0.57 - 1.00 mg/dL   eGFR 28 (L) >40 fO/fpw/8.26   BUN/Creatinine Ratio 11 9 - 23   Sodium 140 134 - 144 mmol/L   Potassium 3.8 3.5 - 5.2 mmol/L   Chloride 100 96 - 106 mmol/L   CO2 19 (L) 20 - 29 mmol/L   Calcium 9.4 8.7 - 10.2 mg/dL   Total Protein 6.6 6.0 - 8.5 g/dL   Albumin 4.0 3.8 - 4.9 g/dL   Globulin, Total 2.6  1.5 - 4.5 g/dL   Bilirubin Total <9.7 0.0 - 1.2 mg/dL   Alkaline Phosphatase 66 49 - 135 IU/L    Comment:               **Please note reference interval change**   AST 21 0 - 40 IU/L   ALT 14 0 - 32 IU/L  Lipid Panel w/o Chol/HDL Ratio     Status: Abnormal   Collection Time: 04/01/24  8:09 AM  Result Value Ref Range   Cholesterol, Total 184 100 - 199 mg/dL   Triglycerides 667 (H) 0 - 149 mg/dL   HDL 42 >60 mg/dL   VLDL Cholesterol Cal 55 (H) 5 - 40 mg/dL   LDL Chol Calc (NIH) 87 0 - 99 mg/dL  Vitamin D  (25 hydroxy)     Status: None   Collection Time: 04/01/24  8:09 AM  Result Value Ref Range   Vit D, 25-Hydroxy 64.9  30.0 - 100.0 ng/mL    Comment: Vitamin D  deficiency has been defined by the Institute of Medicine and an Endocrine Society practice guideline as a level of serum 25-OH vitamin D  less than 20 ng/mL (1,2). The Endocrine Society went on to further define vitamin D  insufficiency as a level between 21 and 29 ng/mL (2). 1. IOM (Institute of Medicine). 2010. Dietary reference    intakes for calcium and D. [place]: The    [employer]. 2. Holick MF, Binkley [place], Bischoff-Ferrari HA, et al.    Evaluation, treatment, and prevention of vitamin D     deficiency: an Endocrine Society clinical practice    guideline. JCEM. 2011 Jul; 96(7):1911-30.   CBC with Diff     Status: Abnormal   Collection Time: 04/01/24  8:09 AM  Result Value Ref Range   WBC 8.2 3.4 - 10.8 x10E3/uL   RBC 3.60 (L) 3.77 - 5.28 x10E6/uL   Hemoglobin 11.4 11.1 - 15.9 g/dL   Hematocrit 64.8 65.9 - 46.6 %   MCV 98 (H) 79 - 97 fL   MCH 31.7 26.6 - 33.0 pg   MCHC 32.5 31.5 - 35.7 g/dL   RDW 86.9 88.2 - 84.5 %   Platelets 323 150 - 450 x10E3/uL   Neutrophils 51 Not Estab. %   Lymphs 36 Not Estab. %   Monocytes 8 Not Estab. %   Eos 3 Not Estab. %   Basos 1 Not Estab. %   Neutrophils Absolute 4.2 1.4 - 7.0 x10E3/uL   Lymphocytes Absolute 2.9 0.7 - 3.1 x10E3/uL   Monocytes Absolute 0.7 0.1 - 0.9 x10E3/uL   EOS (ABSOLUTE) 0.2 0.0 - 0.4 x10E3/uL   Basophils Absolute 0.1 0.0 - 0.2 x10E3/uL   Immature Granulocytes 1 Not Estab. %   Immature Grans (Abs) 0.1 0.0 - 0.1 x10E3/uL  Hemoglobin A1c     Status: Abnormal   Collection Time: 04/01/24  8:09 AM  Result Value Ref Range   Hgb A1c MFr Bld 7.5 (H) 4.8 - 5.6 %    Comment:          Prediabetes: 5.7 - 6.4          Diabetes: >6.4          Glycemic control for adults with diabetes: <7.0    Est. average glucose Bld gHb Est-mCnc 169 mg/dL      Assessment & Plan:  Meds adjusted. Strict diet control. Weight reduction emphasized.  Sahithi Ordoyne was seen today for  follow-up.  Diagnoses and all orders for this  visit:  Type 2 diabetes mellitus with hyperglycemia, without long-term current use of insulin (HCC) -     glimepiride (AMARYL) 2 MG tablet; Take 1 tablet (2 mg total) by mouth 2 (two) times daily. -     Protein / Creatinine Ratio, Urine  Colon cancer screening -     Cologuard  Other specified hypothyroidism -     TSH+T4F+T3Free -     levothyroxine  (SYNTHROID ) 50 MCG tablet; Take 1 tablet (50 mcg total) by mouth daily before breakfast.  CHF (congestive heart failure), NYHA class III, acute on chronic, diastolic (HCC)  Essential hypertension, benign  GAD (generalized anxiety disorder)  CKD (chronic kidney disease) stage 4, GFR 15-29 ml/min (HCC)    Follow up one month.  Total time spent: 30 minutes  FERNAND FREDY RAMAN, MD  04/07/2024   This document may have been prepared by [facility] Voice Recognition software and as such may include unintentional dictation errors.

## 2024-04-08 ENCOUNTER — Ambulatory Visit: Payer: Self-pay | Admitting: Internal Medicine

## 2024-04-08 LAB — TSH+T4F+T3FREE
Free T4: 1.12 ng/dL (ref 0.82–1.77)
T3, Free: 2.6 pg/mL (ref 2.0–4.4)
TSH: 4.45 u[IU]/mL (ref 0.450–4.500)

## 2024-04-08 LAB — PROTEIN / CREATININE RATIO, URINE
Creatinine, Urine: 24.6 mg/dL
Protein, Ur: 4.7 mg/dL
Protein/Creat Ratio: 191 mg/g{creat} (ref 0–200)

## 2024-04-09 ENCOUNTER — Other Ambulatory Visit: Payer: Self-pay | Admitting: Internal Medicine

## 2024-04-09 DIAGNOSIS — E038 Other specified hypothyroidism: Secondary | ICD-10-CM

## 2024-04-09 MED ORDER — LEVOTHYROXINE SODIUM 25 MCG PO TABS: 25.0000 ug | ORAL_TABLET | Freq: Every day | ORAL | 3 refills | Status: AC

## 2024-04-09 NOTE — Progress Notes (Signed)
 Patient notified

## 2024-04-16 ENCOUNTER — Other Ambulatory Visit: Payer: Self-pay | Admitting: Internal Medicine

## 2024-04-16 MED ORDER — ACCU-CHEK GUIDE TEST VI STRP: ORAL_STRIP | 12 refills | Status: AC

## 2024-04-16 MED ORDER — ACCU-CHEK GUIDE W/DEVICE KIT: 1.0000 | PACK | Freq: Every day | 0 refills | Status: DC

## 2024-04-16 MED ORDER — ACCU-CHEK SOFTCLIX LANCETS MISC: 12 refills | Status: DC

## 2024-04-30 LAB — PROTEIN / CREATININE RATIO, URINE
Albumin, U: 0.5
Creatinine, Urine: 110

## 2024-04-30 LAB — MICROALBUMIN / CREATININE URINE RATIO: Microalb Creat Ratio: 4.5

## 2024-05-01 ENCOUNTER — Other Ambulatory Visit: Payer: Self-pay

## 2024-05-01 ENCOUNTER — Ambulatory Visit: Admitting: Cardiovascular Disease

## 2024-05-01 ENCOUNTER — Encounter: Payer: Self-pay | Admitting: Cardiovascular Disease

## 2024-05-01 VITALS — BP 116/76 | HR 78 | Ht 65.0 in | Wt 239.0 lb

## 2024-05-01 DIAGNOSIS — E782 Mixed hyperlipidemia: Secondary | ICD-10-CM | POA: Diagnosis not present

## 2024-05-01 DIAGNOSIS — I5033 Acute on chronic diastolic (congestive) heart failure: Secondary | ICD-10-CM

## 2024-05-01 DIAGNOSIS — I420 Dilated cardiomyopathy: Secondary | ICD-10-CM

## 2024-05-01 DIAGNOSIS — Z0130 Encounter for examination of blood pressure without abnormal findings: Secondary | ICD-10-CM

## 2024-05-01 DIAGNOSIS — I351 Nonrheumatic aortic (valve) insufficiency: Secondary | ICD-10-CM

## 2024-05-01 DIAGNOSIS — I340 Nonrheumatic mitral (valve) insufficiency: Secondary | ICD-10-CM

## 2024-05-01 DIAGNOSIS — N184 Chronic kidney disease, stage 4 (severe): Secondary | ICD-10-CM

## 2024-05-01 MED ORDER — ACCU-CHEK SOFTCLIX LANCETS MISC: 12 refills | Status: AC

## 2024-05-01 MED ORDER — ACCU-CHEK GUIDE W/DEVICE KIT: 1.0000 | PACK | Freq: Every day | 0 refills | Status: AC

## 2024-05-01 NOTE — Progress Notes (Signed)
 Cardiology Office Note   Date:  05/01/2024   ID:  Grace Fox, DOB March 23, 1971, MRN [id number]  PCP:  Fernand Fredy RAMAN, MD  Cardiologist:  Denyse Fernand, MD      History of Present Illness: Grace Fox is a 53 y.o. female who presents for  Chief Complaint  Patient presents with   Follow-up    1 month follow up    No reaction to shots. No chest pain or SOB.      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   Oxygen deficit    2L/ HS   Pneumonia    PONV (postoperative nausea and vomiting)    with GA   Thyroid  disease    Vertigo      Past Surgical History:  Procedure Laterality Date   CHOLECYSTECTOMY     CHOLECYSTECTOMY N/A 02/28/2018   Procedure: LAPAROSCOPIC CHOLECYSTECTOMY WITH INTRAOPERATIVE CHOLANGIOGRAM;  Surgeon: Curvin Deward MOULD, MD;  Location: [facility];  Service: General;  Laterality: N/A;   LEEP N/A 02/16/2019   Procedure: LOOP ELECTROSURGICAL EXCISION PROCEDURE (LEEP);  Surgeon: Janit Alm Agent, MD;  Location: [facility];  Service: Gynecology;  Laterality: N/A;   LEFT HEART CATH AND CORONARY ANGIOGRAPHY Left 04/14/2020   Procedure: LEFT HEART CATH AND CORONARY ANGIOGRAPHY;  Surgeon: Fernand Denyse LABOR, MD;  Location: [facility];  Service: Cardiovascular;  Laterality: Left;     Current Outpatient Medications  Medication Sig Dispense Refill   ACCRUFER  30 MG CAPS TAKE ONE (1) CAPSULE BY MOUTH ONCE DAILY 90 capsule 11   ALPRAZolam (XANAX) 1 MG tablet Take 1 mg by  mouth 3 (three) times daily as needed for anxiety.      buPROPion  (WELLBUTRIN  XL) 300 MG 24 hr tablet Take 300 mg by mouth daily.   2   Cetirizine HCl (ZYRTEC ALLERGY) 10 MG CAPS Take 10 mg by mouth daily.     Cholecalciferol (VITAMIN D3) 1.25 MG (50000 UT) CAPS Take 1 capsule (1.25 mg total) by mouth once a week. 12 capsule 3   dapagliflozin  propanediol (FARXIGA ) 10 MG TABS tablet Take 1 tablet by mouth once daily 30 tablet 3   divalproex (DEPAKOTE) 500 MG DR tablet Take 500 mg by mouth at bedtime. (Patient taking differently: Take 500 mg by mouth 2 (two) times daily.)     ezetimibe  (ZETIA ) 10 MG tablet TAKE 1 TABLET BY MOUTH DAILY 90 tablet 11   famotidine  (PEPCID ) 20 MG tablet Take 20 mg by mouth 2 (two) times daily.     fenofibrate  (TRICOR ) 145 MG tablet TAKE 1 TABLET BY MOUTH ONCE DAILY 90 tablet 2   folic acid  (FOLVITE ) 1 MG tablet Take 1 tablet (1 mg total) by mouth daily. 90 tablet 2   furosemide  (LASIX ) 20 MG tablet TAKE 1 TABLET BY MOUTH DAILY 90 tablet  11   glimepiride (AMARYL) 2 MG tablet Take 1 tablet (2 mg total) by mouth 2 (two) times daily. 60 tablet 6   glucose blood (ACCU-CHEK GUIDE TEST) test strip Use as instructed 100 each 12   levothyroxine  (LEVOXYL ) 25 MCG tablet Take 1 tablet (25 mcg total) by mouth daily before breakfast. 30 tablet 3   lipase/protease/amylase (CREON) 12000-38000 units CPEP capsule Take 36,000 Units by mouth.     losartan  (COZAAR ) 25 MG tablet TAKE 1/2 TABLET BY MOUTH ONCE DAILY 45 tablet 11   meclizine  (ANTIVERT ) 25 MG tablet TAKE 1 TABLET BY MOUTH TWICE DAILY 60 tablet 10   metoprolol  succinate (TOPROL -XL) 25 MG 24 hr tablet Take 1 tablet (25 mg total) by mouth daily. 90 tablet 0   OXYGEN Inhale 2 L into the lungs at bedtime.     pravastatin  (PRAVACHOL ) 40 MG tablet TAKE 1 TABLET BY MOUTH DAILY 30 tablet 10   SYMBICORT  160-4.5 MCG/ACT inhaler INHALE TWO (2) PUFFS BY MOUTH EVERY 12 HOURS 10.2 g 10   VENTOLIN  HFA 108 (90 Base) MCG/ACT inhaler INHALE 2  PUFFS BY MOUTH EVERY 4 HOURS AS NEEDED FOR SHORTNESS OF BREATH OR WHEEZING 18 g 10   Accu-Chek Softclix Lancets lancets Use as instructed 100 each 12   Blood Glucose Monitoring Suppl (ACCU-CHEK GUIDE) w/Device KIT 1 Device by Does not apply route daily. 1 kit 0   No current facility-administered medications for this visit.   Facility-Administered Medications Ordered in Other Visits  Medication Dose Route Frequency Provider Last Rate Last Admin   sodium chloride  flush (NS) 0.9 % injection 3 mL  3 mL Intravenous Q12H Fernand Alter A, MD        Allergies:   Lactose intolerance (gi) and Other    Social History:   reports that she quit smoking about 3 years ago. Her smoking use included cigarettes. She started smoking about 23 years ago. She has a 20 pack-year smoking history. She has never used smokeless tobacco. She reports that she does not drink alcohol and does not use drugs.   Family History:  family history includes Cancer in her paternal grandmother; Diabetes in her father, maternal grandmother, mother, and another family member; Emphysema in her maternal grandmother and paternal grandmother; Heart attack in her maternal grandfather; Heart failure in her brother, father, mother, and another family member; Hypertension in her maternal grandfather; Lung cancer in her father; Stroke in her maternal grandfather; Thyroid  disease in her mother.    ROS:     Review of Systems  Constitutional: Negative.   HENT: Negative.    Eyes: Negative.   Respiratory: Negative.    Gastrointestinal: Negative.   Genitourinary: Negative.   Musculoskeletal: Negative.   Skin: Negative.   Neurological: Negative.   Endo/Heme/Allergies: Negative.   Psychiatric/Behavioral: Negative.    All other systems reviewed and are negative.     All other systems are reviewed and negative.    PHYSICAL EXAM: VS:  BP 116/76   Pulse 78   Ht 5' 5 (1.651 m)   Wt 239 lb (108.4 kg)   SpO2 99%   BMI 39.77 kg/m  , BMI  Body mass index is 39.77 kg/m. Last weight:  Wt Readings from Last 3 Encounters:  05/01/24 239 lb (108.4 kg)  04/07/24 233 lb 3.2 oz (105.8 kg)  04/02/24 231 lb (104.8 kg)     Physical Exam Constitutional:      Appearance: Normal appearance.  Cardiovascular:     Rate and Rhythm:  Normal rate and regular rhythm.     Heart sounds: Normal heart sounds.  Pulmonary:     Effort: Pulmonary effort is normal.     Breath sounds: Normal breath sounds.  Musculoskeletal:     Right lower leg: No edema.     Left lower leg: No edema.  Neurological:     Mental Status: She is alert.       EKG:   Recent Labs: 04/01/2024: ALT 14; BUN 24; Creatinine, Ser 2.12; Hemoglobin 11.4; Platelets 323; Potassium 3.8; Sodium 140 04/07/2024: TSH 4.450    Lipid Panel    Component Value Date/Time   CHOL 184 04/01/2024 0809   TRIG 332 (H) 04/01/2024 0809   HDL 42 04/01/2024 0809   LDLCALC 87 04/01/2024 0809      Other studies Reviewed: Additional studies/ records that were reviewed today include:  Review of the above records demonstrates:       No data to display            ASSESSMENT AND PLAN:    ICD-10-CM   1. Dilated cardiomyopathy (HCC)  I42.0    LVEF 51.8 on echo, low side of normal. Had normal coronaries 2021 on cath.    2. Nonrheumatic aortic valve insufficiency  I35.1    Moderate to severe AR    3. Nonrheumatic mitral valve regurgitation  I34.0     4. Mixed hyperlipidemia  E78.2     5. CHF (congestive heart failure), NYHA class III, acute on chronic, diastolic (HCC)  I50.33     6. CKD (chronic kidney disease) stage 4, GFR 15-29 ml/min (HCC)  N18.4    creat went up to 2.12 from 1.8. stopped spironolactone , but no improvement in renal function. Creat yesterday 2.09       Problem List Items Addressed This Visit       Cardiovascular and Mediastinum   CHF (congestive heart failure), NYHA class III, acute on chronic, diastolic (HCC)   Dilated cardiomyopathy (HCC) -  Primary   Nonrheumatic mitral valve regurgitation   Nonrheumatic aortic valve insufficiency     Genitourinary   CKD (chronic kidney disease) stage 4, GFR 15-29 ml/min (HCC)   Other Visit Diagnoses       Mixed hyperlipidemia              Disposition:   Return in about 2 months (around 07/01/2024).    Total time spent: 30 minutes  Signed,  Denyse Bathe, MD  05/01/2024 11:16 AM    [facility]

## 2024-05-05 ENCOUNTER — Other Ambulatory Visit: Payer: Self-pay | Admitting: Cardiovascular Disease

## 2024-05-07 ENCOUNTER — Ambulatory Visit: Admitting: Internal Medicine

## 2024-05-08 ENCOUNTER — Ambulatory Visit: Admission: RE | Admit: 2024-05-08 | Source: Ambulatory Visit

## 2024-05-11 ENCOUNTER — Telehealth: Payer: Self-pay | Admitting: Oncology

## 2024-05-11 NOTE — Telephone Encounter (Signed)
 Patient called- she states Dr. Beverley said she was sending you a note to ask for her to have iron infusions. She is asking if we can get those scheduled. Please advise.

## 2024-05-11 NOTE — Telephone Encounter (Signed)
 Please move up her appointment from December to next Tuesday and she can get IV iron on that day

## 2024-05-12 ENCOUNTER — Encounter: Payer: Self-pay | Admitting: Internal Medicine

## 2024-05-12 ENCOUNTER — Ambulatory Visit: Admitting: Internal Medicine

## 2024-05-12 VITALS — BP 98/72 | HR 125 | Ht 65.0 in | Wt 234.2 lb

## 2024-05-12 DIAGNOSIS — I420 Dilated cardiomyopathy: Secondary | ICD-10-CM

## 2024-05-12 DIAGNOSIS — D638 Anemia in other chronic diseases classified elsewhere: Secondary | ICD-10-CM | POA: Diagnosis not present

## 2024-05-12 DIAGNOSIS — N184 Chronic kidney disease, stage 4 (severe): Secondary | ICD-10-CM

## 2024-05-12 DIAGNOSIS — E1159 Type 2 diabetes mellitus with other circulatory complications: Secondary | ICD-10-CM | POA: Diagnosis not present

## 2024-05-12 DIAGNOSIS — E782 Mixed hyperlipidemia: Secondary | ICD-10-CM | POA: Insufficient documentation

## 2024-05-12 DIAGNOSIS — I152 Hypertension secondary to endocrine disorders: Secondary | ICD-10-CM

## 2024-05-12 DIAGNOSIS — E038 Other specified hypothyroidism: Secondary | ICD-10-CM

## 2024-05-12 DIAGNOSIS — E1169 Type 2 diabetes mellitus with other specified complication: Secondary | ICD-10-CM

## 2024-05-12 DIAGNOSIS — E1165 Type 2 diabetes mellitus with hyperglycemia: Secondary | ICD-10-CM | POA: Diagnosis not present

## 2024-05-12 LAB — POCT CBG (FASTING - GLUCOSE)-MANUAL ENTRY: Glucose Fasting, POC: 95 mg/dL (ref 70–99)

## 2024-05-12 NOTE — Progress Notes (Signed)
 Established Patient Office Visit  Subjective:  Patient ID: Grace Fox, female    DOB: 12-02-1970  Age: 53 y.o. MRN: [id number]  Chief Complaint  Patient presents with   Follow-up    1 month follow up    Patient is here today for routine follow up. She reports she has been doing well and has no new complaints today.  She has seen Cardiology and Nephrology since she was here last. Her Cardiologist stopped spirolactone. Her Nephrologist wants to FU on her kidney function in 2 months and discuss need for renal biopsy at that time. She reports taking all her medications as prescribed. She does state she does not check her fasting blood sugars. She denies hypoglycemia events. Reinforced healthy diet and exercise as tolerated. Recommended patient to check her blood sugars at least once a day.  Patients mammogram has not been completed. Provided patient with the contact information to get mammogram scheduled. She reports having her cologuard at home but has not completed the test and mailed it off yet. Patient states she missed her lung cancer screening appointment due to a move and she needs to contact them to get it rescheduled. Will provide patient with their contact information so she can get these screenings completed.  Flu shot has been done. Reports getting Hep A & B, pneumonia vaccine, shingles series completed as her local pharmacy reported she had no hx of hepatitis series being completed on [facility].    No other concerns at this time.   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   Oxygen deficit    2L/ HS   Pneumonia    PONV (postoperative nausea and vomiting)    with GA   Thyroid  disease    Vertigo     Past Surgical History:  Procedure Laterality Date   CHOLECYSTECTOMY     CHOLECYSTECTOMY N/A 02/28/2018   Procedure: LAPAROSCOPIC CHOLECYSTECTOMY WITH INTRAOPERATIVE CHOLANGIOGRAM;  Surgeon: Curvin Deward MOULD, MD;  Location: [facility];  Service: General;  Laterality: N/A;   LEEP N/A 02/16/2019   Procedure: LOOP ELECTROSURGICAL EXCISION PROCEDURE (LEEP);  Surgeon: Janit Alm Agent, MD;  Location: [facility];  Service: Gynecology;  Laterality: N/A;   LEFT HEART CATH AND CORONARY ANGIOGRAPHY Left 04/14/2020   Procedure: LEFT HEART CATH AND CORONARY ANGIOGRAPHY;  Surgeon: Fernand Denyse LABOR, MD;  Location: [facility];  Service: Cardiovascular;  Laterality: Left;    Social History   Socioeconomic History   Marital status: Divorced    Spouse name: Not on file   Number of children: Not on file   Years of education: Not on file   Highest education level: Not on file  Occupational History   Not on file  Tobacco Use   Smoking status: Former    Current packs/day: 0.00    Average packs/day: 1  pack/day for 20.0 years (20.0 ttl pk-yrs)    Types: Cigarettes    Start date: 02/06/2001    Quit date: 02/06/2021    Years since quitting: 3.2   Smokeless tobacco: Never  Vaping Use   Vaping status: Some Days  Substance and Sexual Activity   Alcohol use: No   Drug use: No   Sexual activity: Yes    Birth control/protection: I.U.D.  Other Topics Concern   Not on file  Social History Narrative   Not on file   Social Drivers of Health   Financial Resource Strain: Not on file  Food Insecurity: Not on file  Transportation Needs: Not on file  Physical Activity: Not on file  Stress: Not on file  Social Connections: Not on file  Intimate Partner Violence: Not on file    Family  History  Problem Relation Age of Onset   Diabetes Mother    Thyroid  disease Mother    Heart failure Mother    Diabetes Father    Heart failure Father    Lung cancer Father    Heart failure Brother    Diabetes Maternal Grandmother    Emphysema Maternal Grandmother    Hypertension Maternal Grandfather    Stroke Maternal Grandfather    Heart attack Maternal Grandfather    Emphysema Paternal Grandmother    Cancer Paternal Grandmother    Diabetes Other    Heart failure Other    Breast cancer Neg Hx     Allergies  Allergen Reactions   Lactose Intolerance (Gi) Other (See Comments)    Gi upset   Other Other (See Comments)    Grass- stuffy nose/itching/sneezing Pollen- stuffy nose/itching/sneezing    Outpatient Medications Prior to Visit  Medication Sig   ACCRUFER  30 MG CAPS TAKE ONE (1) CAPSULE BY MOUTH ONCE DAILY   Accu-Chek Softclix Lancets lancets Use as instructed   ALPRAZolam (XANAX) 1 MG tablet Take 1 mg by mouth 3 (three) times daily as needed for anxiety.    Blood Glucose Monitoring Suppl (ACCU-CHEK GUIDE) w/Device KIT 1 Device by Does not apply route daily.   buPROPion  (WELLBUTRIN  XL) 300 MG 24 hr tablet Take 300 mg by mouth daily.    Cetirizine HCl (ZYRTEC ALLERGY) 10 MG CAPS Take 10 mg by mouth daily.   Cholecalciferol (VITAMIN D3) 1.25 MG (50000 UT) CAPS Take 1 capsule (1.25 mg total) by mouth once a week.   dapagliflozin  propanediol (FARXIGA ) 10 MG TABS tablet Take 1 tablet by mouth once daily   divalproex (DEPAKOTE) 500 MG DR tablet Take 500 mg by mouth at bedtime. (Patient taking differently: Take 500 mg by mouth 2 (two) times daily.)   ezetimibe  (ZETIA ) 10 MG tablet TAKE 1 TABLET BY MOUTH DAILY   famotidine  (PEPCID ) 20 MG tablet Take 20 mg by mouth 2 (two) times daily.   fenofibrate  (TRICOR ) 145 MG tablet TAKE 1 TABLET BY MOUTH ONCE DAILY   folic acid  (FOLVITE ) 1 MG tablet Take 1 tablet (1 mg total) by mouth daily.   furosemide  (LASIX ) 20 MG tablet TAKE 1 TABLET  BY MOUTH DAILY   glimepiride (AMARYL) 2 MG tablet Take 1 tablet (2 mg total) by mouth 2 (two) times daily.   glucose blood (ACCU-CHEK GUIDE TEST) test strip Use as instructed   levothyroxine  (LEVOXYL ) 25 MCG tablet Take 1 tablet (25 mcg total) by mouth daily before breakfast.   lipase/protease/amylase (CREON) 12000-38000 units CPEP capsule Take 36,000 Units by mouth.   losartan  (COZAAR ) 25 MG tablet TAKE  1/2 TABLET BY MOUTH ONCE DAILY   meclizine  (ANTIVERT ) 25 MG tablet TAKE 1 TABLET BY MOUTH TWICE DAILY   metoprolol  succinate (TOPROL -XL) 25 MG 24 hr tablet Take 1 tablet by mouth once daily   OXYGEN Inhale 2 L into the lungs at bedtime.   pravastatin  (PRAVACHOL ) 40 MG tablet TAKE 1 TABLET BY MOUTH DAILY   SYMBICORT  160-4.5 MCG/ACT inhaler INHALE TWO (2) PUFFS BY MOUTH EVERY 12 HOURS   VENTOLIN  HFA 108 (90 Base) MCG/ACT inhaler INHALE 2 PUFFS BY MOUTH EVERY 4 HOURS AS NEEDED FOR SHORTNESS OF BREATH OR WHEEZING   Facility-Administered Medications Prior to Visit  Medication Dose Route Frequency Provider   sodium chloride  flush (NS) 0.9 % injection 3 mL  3 mL Intravenous Q12H Fernand Denyse LABOR, MD    Review of Systems  Constitutional: Negative.  Negative for chills, fever and malaise/fatigue.  HENT: Negative.  Negative for congestion and sore throat.   Eyes: Negative.  Negative for blurred vision and pain.  Respiratory: Negative.  Negative for cough and shortness of breath.   Cardiovascular: Negative.  Negative for chest pain, palpitations and leg swelling.  Gastrointestinal: Negative.  Negative for abdominal pain, blood in stool, constipation, diarrhea, heartburn, melena, nausea and vomiting.  Genitourinary: Negative.  Negative for dysuria, flank pain, frequency and urgency.  Musculoskeletal: Negative.  Negative for joint pain and myalgias.  Skin: Negative.   Neurological: Negative.  Negative for dizziness, tingling, sensory change, weakness and headaches.  Endo/Heme/Allergies: Negative.    Psychiatric/Behavioral: Negative.  Negative for depression and suicidal ideas. The patient is not nervous/anxious.        Objective:   BP 98/72   Pulse (!) 125   Ht 5' 5 (1.651 m)   Wt 234 lb 3.2 oz (106.2 kg)   SpO2 98%   BMI 38.97 kg/m   Vitals:   05/12/24 1443  BP: 98/72  Pulse: (!) 125  Height: 5' 5 (1.651 m)  Weight: 234 lb 3.2 oz (106.2 kg)  SpO2: 98%  BMI (Calculated): 38.97    Physical Exam Vitals and nursing note reviewed.  Constitutional:      Appearance: Normal appearance.  HENT:     Head: Normocephalic and atraumatic.     Nose: Nose normal.     Mouth/Throat:     Mouth: Mucous membranes are moist.     Dentition: Abnormal dentition.     Pharynx: Oropharynx is clear.  Eyes:     Conjunctiva/sclera: Conjunctivae normal.     Pupils: Pupils are equal, round, and reactive to light.  Cardiovascular:     Rate and Rhythm: Normal rate and regular rhythm.     Pulses: Normal pulses.     Heart sounds: Normal heart sounds. No murmur heard. Pulmonary:     Effort: Pulmonary effort is normal.     Breath sounds: Normal breath sounds. No wheezing.  Abdominal:     General: Bowel sounds are normal.     Palpations: Abdomen is soft.     Tenderness: There is no abdominal tenderness. There is no right CVA tenderness or left CVA tenderness.  Musculoskeletal:        General: Normal range of motion.     Cervical back: Normal range of motion.     Right lower leg: No edema.     Left lower leg: No edema.  Skin:    General: Skin is warm and dry.  Neurological:     General: No focal deficit present.     Mental Status:  She is alert and oriented to person, place, and time.  Psychiatric:        Mood and Affect: Mood normal.        Behavior: Behavior normal.      Results for orders placed or performed in visit on 05/12/24  Microalbumin / creatinine urine ratio  Result Value Ref Range   Microalb Creat Ratio 4.5   Protein / creatinine ratio, urine  Result Value Ref  Range   Creatinine, Urine 110    Albumin, U 0.5   POCT CBG (Fasting - Glucose)  Result Value Ref Range   Glucose Fasting, POC 95 70 - 99 mg/dL    Recent Results (from the past 2160 hours)  CMP14+EGFR     Status: Abnormal   Collection Time: 04/01/24  8:09 AM  Result Value Ref Range   Glucose 124 (H) 70 - 99 mg/dL   BUN 24 6 - 24 mg/dL   Creatinine, Ser 7.87 (H) 0.57 - 1.00 mg/dL   eGFR 28 (L) >40 fO/fpw/8.26   BUN/Creatinine Ratio 11 9 - 23   Sodium 140 134 - 144 mmol/L   Potassium 3.8 3.5 - 5.2 mmol/L   Chloride 100 96 - 106 mmol/L   CO2 19 (L) 20 - 29 mmol/L   Calcium 9.4 8.7 - 10.2 mg/dL   Total Protein 6.6 6.0 - 8.5 g/dL   Albumin 4.0 3.8 - 4.9 g/dL   Globulin, Total 2.6 1.5 - 4.5 g/dL   Bilirubin Total <9.7 0.0 - 1.2 mg/dL   Alkaline Phosphatase 66 49 - 135 IU/L    Comment:               **Please note reference interval change**   AST 21 0 - 40 IU/L   ALT 14 0 - 32 IU/L  Lipid Panel w/o Chol/HDL Ratio     Status: Abnormal   Collection Time: 04/01/24  8:09 AM  Result Value Ref Range   Cholesterol, Total 184 100 - 199 mg/dL   Triglycerides 667 (H) 0 - 149 mg/dL   HDL 42 >60 mg/dL   VLDL Cholesterol Cal 55 (H) 5 - 40 mg/dL   LDL Chol Calc (NIH) 87 0 - 99 mg/dL  Vitamin D  (25 hydroxy)     Status: None   Collection Time: 04/01/24  8:09 AM  Result Value Ref Range   Vit D, 25-Hydroxy 64.9 30.0 - 100.0 ng/mL    Comment: Vitamin D  deficiency has been defined by the Institute of Medicine and an Endocrine Society practice guideline as a level of serum 25-OH vitamin D  less than 20 ng/mL (1,2). The Endocrine Society went on to further define vitamin D  insufficiency as a level between 21 and 29 ng/mL (2). 1. IOM (Institute of Medicine). 2010. Dietary reference    intakes for calcium and D. [place]: The    [employer]. 2. Holick MF, Binkley [place], Bischoff-Ferrari HA, et al.    Evaluation, treatment, and prevention of vitamin D     deficiency: an Endocrine  Society clinical practice    guideline. JCEM. 2011 Jul; 96(7):1911-30.   CBC with Diff     Status: Abnormal   Collection Time: 04/01/24  8:09 AM  Result Value Ref Range   WBC 8.2 3.4 - 10.8 x10E3/uL   RBC 3.60 (L) 3.77 - 5.28 x10E6/uL   Hemoglobin 11.4 11.1 - 15.9 g/dL   Hematocrit 64.8 65.9 - 46.6 %   MCV 98 (H) 79 - 97 fL   MCH 31.7  26.6 - 33.0 pg   MCHC 32.5 31.5 - 35.7 g/dL   RDW 86.9 88.2 - 84.5 %   Platelets 323 150 - 450 x10E3/uL   Neutrophils 51 Not Estab. %   Lymphs 36 Not Estab. %   Monocytes 8 Not Estab. %   Eos 3 Not Estab. %   Basos 1 Not Estab. %   Neutrophils Absolute 4.2 1.4 - 7.0 x10E3/uL   Lymphocytes Absolute 2.9 0.7 - 3.1 x10E3/uL   Monocytes Absolute 0.7 0.1 - 0.9 x10E3/uL   EOS (ABSOLUTE) 0.2 0.0 - 0.4 x10E3/uL   Basophils Absolute 0.1 0.0 - 0.2 x10E3/uL   Immature Granulocytes 1 Not Estab. %   Immature Grans (Abs) 0.1 0.0 - 0.1 x10E3/uL  Hemoglobin A1c     Status: Abnormal   Collection Time: 04/01/24  8:09 AM  Result Value Ref Range   Hgb A1c MFr Bld 7.5 (H) 4.8 - 5.6 %    Comment:          Prediabetes: 5.7 - 6.4          Diabetes: >6.4          Glycemic control for adults with diabetes: <7.0    Est. average glucose Bld gHb Est-mCnc 169 mg/dL  [id number]     Status: None   Collection Time: 04/07/24  2:20 PM  Result Value Ref Range   TSH 4.450 0.450 - 4.500 uIU/mL   T3, Free 2.6 2.0 - 4.4 pg/mL   Free T4 1.12 0.82 - 1.77 ng/dL  Protein / Creatinine Ratio, Urine     Status: None   Collection Time: 04/07/24  2:22 PM  Result Value Ref Range   Creatinine, Urine 24.6 Not Estab. mg/dL   Protein, Ur 4.7 Not Estab. mg/dL   Protein/Creat Ratio 808 0 - 200 mg/g creat  Microalbumin / creatinine urine ratio     Status: None   Collection Time: 04/30/24 12:00 AM  Result Value Ref Range   Microalb Creat Ratio 4.5   Protein / creatinine ratio, urine     Status: None   Collection Time: 04/30/24 12:00 AM  Result Value Ref Range   Creatinine, Urine 110     Albumin, U 0.5   POCT CBG (Fasting - Glucose)     Status: None   Collection Time: 05/12/24  2:47 PM  Result Value Ref Range   Glucose Fasting, POC 95 70 - 99 mg/dL      Assessment & Plan:  Continue taking medications as prescribed. Reinforced healthy diet and exercise as tolerated. Reinforced need to check fasting blood sugars at least once a day. Keep specialists appointments as scheduled. Reinforced need to get screenings completed. Problem List Items Addressed This Visit     Dilated cardiomyopathy (HCC)   Essential hypertension, benign - Primary   Type 2 diabetes mellitus with hyperglycemia, without long-term current use of insulin (HCC)   Relevant Orders   POCT CBG (Fasting - Glucose) (Completed)   CKD (chronic kidney disease) stage 4, GFR 15-29 ml/min (HCC)   Other specified hypothyroidism   Anemia of chronic disease   Mixed hyperlipidemia    Return in about 2 months (around 07/12/2024).   Total time spent: 20 minutes. This time includes review of previous notes and results and patient face to face interaction during today's visit.    FERNAND FREDY RAMAN, MD  05/12/2024   This document may have been prepared by [facility] Voice Recognition software and as such may include unintentional dictation errors.

## 2024-05-13 ENCOUNTER — Other Ambulatory Visit: Payer: Self-pay | Admitting: Cardiovascular Disease

## 2024-05-19 ENCOUNTER — Inpatient Hospital Stay

## 2024-05-19 ENCOUNTER — Inpatient Hospital Stay: Admitting: Oncology

## 2024-05-19 ENCOUNTER — Inpatient Hospital Stay: Attending: Oncology

## 2024-05-19 ENCOUNTER — Encounter: Payer: Self-pay | Admitting: Oncology

## 2024-05-19 VITALS — BP 114/45 | HR 78 | Temp 96.0°F | Resp 18

## 2024-05-19 VITALS — BP 129/65 | HR 85 | Temp 98.8°F | Resp 19 | Ht 65.0 in | Wt 238.4 lb

## 2024-05-19 DIAGNOSIS — D508 Other iron deficiency anemias: Secondary | ICD-10-CM

## 2024-05-19 DIAGNOSIS — D509 Iron deficiency anemia, unspecified: Secondary | ICD-10-CM | POA: Insufficient documentation

## 2024-05-19 DIAGNOSIS — Z801 Family history of malignant neoplasm of trachea, bronchus and lung: Secondary | ICD-10-CM | POA: Diagnosis not present

## 2024-05-19 DIAGNOSIS — D631 Anemia in chronic kidney disease: Secondary | ICD-10-CM | POA: Diagnosis not present

## 2024-05-19 DIAGNOSIS — Z87891 Personal history of nicotine dependence: Secondary | ICD-10-CM | POA: Diagnosis not present

## 2024-05-19 DIAGNOSIS — N189 Chronic kidney disease, unspecified: Secondary | ICD-10-CM | POA: Diagnosis present

## 2024-05-19 DIAGNOSIS — D649 Anemia, unspecified: Secondary | ICD-10-CM

## 2024-05-19 LAB — CMP (CANCER CENTER ONLY)
ALT: 19 U/L (ref 0–44)
AST: 21 U/L (ref 15–41)
Albumin: 3.5 g/dL (ref 3.5–5.0)
Alkaline Phosphatase: 48 U/L (ref 38–126)
Anion gap: 10 (ref 5–15)
BUN: 39 mg/dL — ABNORMAL HIGH (ref 6–20)
CO2: 23 mmol/L (ref 22–32)
Calcium: 9.3 mg/dL (ref 8.9–10.3)
Chloride: 103 mmol/L (ref 98–111)
Creatinine: 2.67 mg/dL — ABNORMAL HIGH (ref 0.44–1.00)
GFR, Estimated: 21 mL/min — ABNORMAL LOW (ref >60)
Glucose, Bld: 122 mg/dL — ABNORMAL HIGH (ref 70–99)
Potassium: 3.8 mmol/L (ref 3.5–5.1)
Sodium: 136 mmol/L (ref 135–145)
Total Bilirubin: 0.4 mg/dL (ref 0.0–1.2)
Total Protein: 7.3 g/dL (ref 6.5–8.1)

## 2024-05-19 LAB — IRON AND TIBC
Iron: 70 ug/dL (ref 28–170)
Saturation Ratios: 13 % (ref 10.4–31.8)
TIBC: 547 ug/dL — ABNORMAL HIGH (ref 250–450)
UIBC: 478 ug/dL

## 2024-05-19 LAB — CBC WITH DIFFERENTIAL (CANCER CENTER ONLY)
Abs Immature Granulocytes: 0.08 K/uL — ABNORMAL HIGH (ref 0.00–0.07)
Basophils Absolute: 0.1 K/uL (ref 0.0–0.1)
Basophils Relative: 1 %
Eosinophils Absolute: 0.2 K/uL (ref 0.0–0.5)
Eosinophils Relative: 3 %
HCT: 34.4 % — ABNORMAL LOW (ref 36.0–46.0)
Hemoglobin: 10.8 g/dL — ABNORMAL LOW (ref 12.0–15.0)
Immature Granulocytes: 1 %
Lymphocytes Relative: 35 %
Lymphs Abs: 2.6 K/uL (ref 0.7–4.0)
MCH: 31.4 pg (ref 26.0–34.0)
MCHC: 31.4 g/dL (ref 30.0–36.0)
MCV: 100 fL (ref 80.0–100.0)
Monocytes Absolute: 0.7 K/uL (ref 0.1–1.0)
Monocytes Relative: 9 %
Neutro Abs: 3.7 K/uL (ref 1.7–7.7)
Neutrophils Relative %: 51 %
Platelet Count: 309 K/uL (ref 150–400)
RBC: 3.44 MIL/uL — ABNORMAL LOW (ref 3.87–5.11)
RDW: 13.8 % (ref 11.5–15.5)
WBC Count: 7.3 K/uL (ref 4.0–10.5)
nRBC: 0 % (ref 0.0–0.2)

## 2024-05-19 LAB — FERRITIN: Ferritin: 355 ng/mL — ABNORMAL HIGH (ref 11–307)

## 2024-05-19 MED ORDER — SODIUM CHLORIDE 0.9% FLUSH
10.0000 mL | Freq: Once | INTRAVENOUS | Status: AC | PRN
  Administered 2024-05-19: 10 mL
  Filled 2024-05-19: qty 10

## 2024-05-19 MED ORDER — IRON SUCROSE 20 MG/ML IV SOLN
200.0000 mg | INTRAVENOUS | Status: DC
  Administered 2024-05-19: 200 mg via INTRAVENOUS
  Filled 2024-05-19: qty 10

## 2024-05-19 NOTE — Progress Notes (Unsigned)
 Patient has no new or acute concerns at this time.

## 2024-05-21 ENCOUNTER — Encounter: Payer: Self-pay | Admitting: Oncology

## 2024-05-21 ENCOUNTER — Inpatient Hospital Stay

## 2024-05-21 VITALS — BP 119/71 | HR 91 | Temp 96.2°F | Resp 18

## 2024-05-21 DIAGNOSIS — D508 Other iron deficiency anemias: Secondary | ICD-10-CM

## 2024-05-21 DIAGNOSIS — N189 Chronic kidney disease, unspecified: Secondary | ICD-10-CM | POA: Diagnosis not present

## 2024-05-21 MED ORDER — SODIUM CHLORIDE 0.9% FLUSH
10.0000 mL | Freq: Once | INTRAVENOUS | Status: AC | PRN
  Administered 2024-05-21: 10 mL
  Filled 2024-05-21: qty 10

## 2024-05-21 MED ORDER — IRON SUCROSE 20 MG/ML IV SOLN
200.0000 mg | INTRAVENOUS | Status: DC
  Administered 2024-05-21: 200 mg via INTRAVENOUS
  Filled 2024-05-21: qty 10

## 2024-05-21 NOTE — Progress Notes (Signed)
 Hematology/Oncology Consult note [facility]  [phone] [phone]  Patient Care Team: Fernand Fredy RAMAN, MD as PCP - General (Internal Medicine) Melanee Annah BROCKS, MD as Consulting Physician (Oncology)   Name of the patient: Grace Fox  [id number]  January 06, 1971   Date of visit: 05/21/24  Diagnosis-anemia of chronic kidney disease  Chief complaint/ Reason for visit-routine follow-up of anemia  Heme/Onc history: Patient is a 53 year old female with a past medical history significant for hypothyroidism hypertension CHF COPD and other medical problems.  She has been referred for anemia and thrombocytosis.  Most recent blood work from 03/01/2021 showed mildly elevated alkaline phosphatase of 169.  Calcium low at 8.5 and serum creatinine elevated at 2.  I do not see any results of CBC in the scanned lab records.   Results of blood work from 04/17/2021 were as follows:CBC showed white cell count of 8, H&H of 10.8/32.6 and a platelet count of 317.  CMP showed a low potassium of 2.7, creatinine of 1.86.  Serum calcium normal at 9.1 with a total protein normal at 7.8.  B12 folate normal.  Ferritin levels normal at 274.  TIBC normal with an iron saturation of 15%.  Myeloma panel showed no M protein.  Haptoglobin normal TSH normal.  Reticulocyte count mildly low for the degree of anemia 2.1%.  Interval history- Discussed the use of AI scribe software for clinical note transcription with the patient, who gave verbal consent to proceed.  Grace Fox is a 53 year old female with chronic kidney disease who presents with anemia management.  Her hemoglobin has decreased from nearly 12 g/dL a year ago to 89.1 g/dL currently. Recent blood work showed a ferritin level of 238 ng/mL, which is within the desired range for chronic kidney disease patients, but her iron saturation was low at 16%.  She has not previously received IV iron infusions. She  is also taking oral iron supplements.  Overall she is feeling well today and denies any significant complaints      ECOG PS- 1 Pain scale- 0   Review of systems- Review of Systems  Constitutional:  Negative for chills, fever, malaise/fatigue and weight loss.  HENT:  Negative for congestion, ear discharge and nosebleeds.   Eyes:  Negative for blurred vision.  Respiratory:  Negative for cough, hemoptysis, sputum production, shortness of breath and wheezing.   Cardiovascular:  Negative for chest pain, palpitations, orthopnea and claudication.  Gastrointestinal:  Negative for abdominal pain, blood in stool, constipation, diarrhea, heartburn, melena, nausea and vomiting.  Genitourinary:  Negative for dysuria, flank pain, frequency, hematuria and urgency.  Musculoskeletal:  Negative for back pain, joint pain and myalgias.  Skin:  Negative for rash.  Neurological:  Negative for dizziness, tingling, focal weakness, seizures, weakness and headaches.  Endo/Heme/Allergies:  Does not bruise/bleed easily.  Psychiatric/Behavioral:  Negative for depression and suicidal ideas. The patient does not have insomnia.       Allergies  Allergen Reactions   Grass Pollen(K-O-R-T-Swt Vern) Cough, Hives and Itching   Lactose Intolerance (Gi) Other (See Comments)    Gi upset   Other Other (See Comments)    Grass- stuffy nose/itching/sneezing Pollen- stuffy nose/itching/sneezing     Past Medical History:  Diagnosis Date   Anemia    Anxiety    Aortic regurgitation    PT STATES SHE NEEDS VALVE REPLACMENT-SEE DR DENYSE KHAN   Back pain    SCIATICA   CHF (congestive heart failure) (  HCC)    COPD (chronic obstructive pulmonary disease) (HCC)    Depression    Dyspnea    DOE   Gall stones    GERD (gastroesophageal reflux disease)    Headache    migraines   History of kidney stones    h/o   Hypertension    Hypothyroidism    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    Oxygen deficit    2L/ HS   Pneumonia    PONV (postoperative nausea and vomiting)    with GA   Thyroid  disease    Vertigo      Past Surgical History:  Procedure Laterality Date   CHOLECYSTECTOMY     CHOLECYSTECTOMY N/A 02/28/2018   Procedure: LAPAROSCOPIC CHOLECYSTECTOMY WITH INTRAOPERATIVE CHOLANGIOGRAM;  Surgeon: Curvin Deward MOULD, MD;  Location: [facility] ORS;  Service: General;  Laterality: N/A;   LEEP N/A 02/16/2019   Procedure: LOOP ELECTROSURGICAL EXCISION PROCEDURE (LEEP);  Surgeon: Janit Alm Agent, MD;  Location: [facility] ORS;  Service: Gynecology;  Laterality: N/A;   LEFT HEART CATH AND CORONARY ANGIOGRAPHY Left 04/14/2020   Procedure: LEFT HEART CATH AND CORONARY ANGIOGRAPHY;  Surgeon: Fernand Denyse LABOR, MD;  Location: [facility]IVE [facility];  Service: Cardiovascular;  Laterality: Left;    Social History   Socioeconomic History   Marital status: Divorced    Spouse name: Not on file   Number of children: Not on file   Years of education: Not on file   Highest education level: Not on file  Occupational History   Not on file  Tobacco Use   Smoking status: Former    Current packs/day: 0.00    Average packs/day: 1 pack/day for 20.0 years (20.0 ttl pk-yrs)    Types: Cigarettes    Start date: 02/06/2001    Quit date: 02/06/2021    Years since quitting: 3.2   Smokeless tobacco: Never  Vaping Use   Vaping status: Some Days  Substance and Sexual Activity   Alcohol use: No   Drug use: No   Sexual activity: Yes    Birth control/protection: I.U.D.  Other Topics Concern   Not on file  Social History Narrative   Not on file   Social Drivers of Health   Financial Resource Strain: Not on file  Food Insecurity: No Food Insecurity (05/19/2024)   Hunger Vital Sign    Worried About Running Out of Food in the Last Year: Never true    Ran Out of Food in the Last Year: Never true  Transportation Needs: No Transportation Needs (05/19/2024)   PRAPARE - Scientist, Research (physical Sciences) (Medical): No    Lack of Transportation (Non-Medical): No  Physical Activity: Not on file  Stress: Not on file  Social Connections: Not on file  Intimate Partner Violence: Not At Risk (05/19/2024)   Humiliation, Afraid, Rape, and Kick questionnaire    Fear of Current or Ex-Partner: No    Emotionally Abused: No    Physically Abused: No    Sexually Abused: No    Family History  Problem Relation Age of Onset   Diabetes Mother    Thyroid  disease Mother    Heart failure Mother    Diabetes Father    Heart failure Father    Lung cancer Father    Heart failure Brother    Diabetes Maternal Grandmother    Emphysema Maternal Grandmother    Hypertension Maternal Grandfather    Stroke  Maternal Grandfather    Heart attack Maternal Grandfather    Emphysema Paternal Grandmother    Cancer Paternal Grandmother    Diabetes Other    Heart failure Other    Breast cancer Neg Hx      Current Outpatient Medications:    ACCRUFER  30 MG CAPS, TAKE ONE (1) CAPSULE BY MOUTH ONCE DAILY, Disp: 90 capsule, Rfl: 11   ALPRAZolam (XANAX) 1 MG tablet, Take 1 mg by mouth 3 (three) times daily as needed for anxiety. , Disp: , Rfl:    Blood Glucose Monitoring Suppl (ACCU-CHEK GUIDE) w/Device KIT, 1 Device by Does not apply route daily., Disp: 1 kit, Rfl: 0   buPROPion  (WELLBUTRIN  XL) 300 MG 24 hr tablet, Take 300 mg by mouth daily. , Disp: , Rfl: 2   Cetirizine HCl (ZYRTEC ALLERGY) 10 MG CAPS, Take 10 mg by mouth daily., Disp: , Rfl:    Cholecalciferol (VITAMIN D3) 1.25 MG (50000 UT) CAPS, Take 1 capsule (1.25 mg total) by mouth once a week., Disp: 12 capsule, Rfl: 3   dapagliflozin  propanediol (FARXIGA ) 10 MG TABS tablet, Take 1 tablet by mouth once daily, Disp: 30 tablet, Rfl: 3   divalproex (DEPAKOTE) 500 MG DR tablet, Take 500 mg by mouth at bedtime. (Patient taking differently: Take 500 mg by mouth 2 (two) times daily.), Disp: , Rfl:    ezetimibe  (ZETIA ) 10 MG tablet, TAKE 1 TABLET BY MOUTH  DAILY, Disp: 90 tablet, Rfl: 11   famotidine  (PEPCID ) 20 MG tablet, Take 20 mg by mouth 2 (two) times daily., Disp: , Rfl:    fenofibrate  (TRICOR ) 145 MG tablet, TAKE 1 TABLET BY MOUTH ONCE DAILY, Disp: 90 tablet, Rfl: 2   folic acid  (FOLVITE ) 1 MG tablet, Take 1 tablet by mouth once daily, Disp: 90 tablet, Rfl: 0   furosemide  (LASIX ) 20 MG tablet, TAKE 1 TABLET BY MOUTH DAILY, Disp: 90 tablet, Rfl: 11   glimepiride (AMARYL) 2 MG tablet, Take 1 tablet (2 mg total) by mouth 2 (two) times daily., Disp: 60 tablet, Rfl: 6   glucose blood (ACCU-CHEK GUIDE TEST) test strip, Use as instructed, Disp: 100 each, Rfl: 12   levothyroxine  (LEVOXYL ) 25 MCG tablet, Take 1 tablet (25 mcg total) by mouth daily before breakfast., Disp: 30 tablet, Rfl: 3   lipase/protease/amylase (CREON) 12000-38000 units CPEP capsule, Take 36,000 Units by mouth., Disp: , Rfl:    losartan  (COZAAR ) 25 MG tablet, TAKE 1/2 TABLET BY MOUTH ONCE DAILY, Disp: 45 tablet, Rfl: 11   meclizine  (ANTIVERT ) 25 MG tablet, TAKE 1 TABLET BY MOUTH TWICE DAILY, Disp: 60 tablet, Rfl: 10   metoprolol  succinate (TOPROL -XL) 25 MG 24 hr tablet, Take 1 tablet by mouth once daily, Disp: 90 tablet, Rfl: 0   OXYGEN, Inhale 2 L into the lungs at bedtime., Disp: , Rfl:    pravastatin  (PRAVACHOL ) 40 MG tablet, TAKE 1 TABLET BY MOUTH DAILY, Disp: 30 tablet, Rfl: 10   SYMBICORT  160-4.5 MCG/ACT inhaler, INHALE TWO (2) PUFFS BY MOUTH EVERY 12 HOURS, Disp: 10.2 g, Rfl: 10   VENTOLIN  HFA 108 (90 Base) MCG/ACT inhaler, INHALE 2 PUFFS BY MOUTH EVERY 4 HOURS AS NEEDED FOR SHORTNESS OF BREATH OR WHEEZING, Disp: 18 g, Rfl: 10   Accu-Chek Softclix Lancets lancets, Use as instructed, Disp: 100 each, Rfl: 12 No current facility-administered medications for this visit.  Facility-Administered Medications Ordered in Other Visits:    sodium chloride  flush (NS) 0.9 % injection 3 mL, 3 mL, Intravenous, Q12H, Fernand Alter A,  MD  Physical exam:  Vitals:   05/19/24 0918  BP:  129/65  Pulse: 85  Resp: 19  Temp: 98.8 F (37.1 C)  TempSrc: Tympanic  SpO2: 97%  Weight: 238 lb 6.4 oz (108.1 kg)  Height: 5' 5 (1.651 m)   Physical Exam Cardiovascular:     Rate and Rhythm: Normal rate and regular rhythm.     Heart sounds: Normal heart sounds.  Pulmonary:     Effort: Pulmonary effort is normal.     Breath sounds: Normal breath sounds.  Skin:    General: Skin is warm and dry.  Neurological:     Mental Status: She is alert and oriented to person, place, and time.      I have personally reviewed labs listed below:    Latest Ref Rng & Units 05/19/2024    8:44 AM  CMP  Glucose 70 - 99 mg/dL 877   BUN 6 - 20 mg/dL 39   Creatinine 9.55 - 1.00 mg/dL 7.32   Sodium 864 - 854 mmol/L 136   Potassium 3.5 - 5.1 mmol/L 3.8   Chloride 98 - 111 mmol/L 103   CO2 22 - 32 mmol/L 23   Calcium 8.9 - 10.3 mg/dL 9.3   Total Protein 6.5 - 8.1 g/dL 7.3   Total Bilirubin 0.0 - 1.2 mg/dL 0.4   Alkaline Phos 38 - 126 U/L 48   AST 15 - 41 U/L 21   ALT 0 - 44 U/L 19       Latest Ref Rng & Units 05/19/2024    8:44 AM  CBC  WBC 4.0 - 10.5 K/uL 7.3   Hemoglobin 12.0 - 15.0 g/dL 89.1   Hematocrit 63.9 - 46.0 % 34.4   Platelets 150 - 400 K/uL 309    Assessment and plan- Patient is a 53 y.o. female here for routine follow-up of anemia of chronic kidney disease  Assessment and Plan    Anemia of chronic kidney disease Hemoglobin decreased from 12 to 10.8 over the past year. Ferritin adequate at 238, but low iron saturation at 16% suggests benefit from iron supplementation. Discussed potential for infusion or anaphylactic reaction, though rare. - Administered first dose of Venofer today. - Schedule remaining four doses of Venofer over the next 2-3 weeks. - Continue oral iron supplementation if tolerated; discontinue if causing gastrointestinal side effects. - Repeat blood work in three months-CBC ferritin and iron studies B12 and folate - Follow up in six months.  No  indication for EPO at this time        Visit Diagnosis 1. Other iron deficiency anemia      Dr. Annah Skene, MD, MPH [facility] [phone] 05/21/2024 9:26 AM

## 2024-05-28 ENCOUNTER — Inpatient Hospital Stay

## 2024-05-28 VITALS — BP 117/58 | HR 80 | Temp 97.5°F | Resp 18

## 2024-05-28 DIAGNOSIS — D508 Other iron deficiency anemias: Secondary | ICD-10-CM

## 2024-05-28 DIAGNOSIS — N189 Chronic kidney disease, unspecified: Secondary | ICD-10-CM | POA: Diagnosis not present

## 2024-05-28 MED ORDER — IRON SUCROSE 20 MG/ML IV SOLN
200.0000 mg | INTRAVENOUS | Status: DC
  Administered 2024-05-28: 200 mg via INTRAVENOUS
  Filled 2024-05-28: qty 10

## 2024-06-01 ENCOUNTER — Inpatient Hospital Stay

## 2024-06-01 VITALS — BP 117/56 | HR 80 | Temp 97.4°F | Resp 16

## 2024-06-01 DIAGNOSIS — D508 Other iron deficiency anemias: Secondary | ICD-10-CM

## 2024-06-01 DIAGNOSIS — N189 Chronic kidney disease, unspecified: Secondary | ICD-10-CM | POA: Diagnosis not present

## 2024-06-01 MED ORDER — IRON SUCROSE 20 MG/ML IV SOLN
200.0000 mg | INTRAVENOUS | Status: DC
  Administered 2024-06-01: 200 mg via INTRAVENOUS
  Filled 2024-06-01: qty 10

## 2024-06-01 NOTE — Patient Instructions (Signed)

## 2024-06-02 ENCOUNTER — Telehealth: Payer: Self-pay | Admitting: Internal Medicine

## 2024-06-02 NOTE — Telephone Encounter (Signed)
 Patient left VM requesting a letter from Dr. Fernand stating she is on oxygen and needs her electricity on 24/7. She needs this to submit to [employer]. Please advise.

## 2024-06-08 ENCOUNTER — Telehealth: Payer: Self-pay | Admitting: Oncology

## 2024-06-08 ENCOUNTER — Inpatient Hospital Stay

## 2024-06-08 NOTE — Telephone Encounter (Signed)
 Pt was scheduled for iron  infusion today. Pt stated she has a cold and will call back to r/s when she is feeling better. Appt for today canceled and noted

## 2024-06-23 ENCOUNTER — Other Ambulatory Visit: Payer: Medicaid Other

## 2024-06-23 ENCOUNTER — Ambulatory Visit: Payer: Medicaid Other | Admitting: Oncology

## 2024-06-30 ENCOUNTER — Ambulatory Visit: Admitting: Cardiovascular Disease

## 2024-06-30 ENCOUNTER — Encounter: Payer: Self-pay | Admitting: Cardiovascular Disease

## 2024-06-30 VITALS — BP 121/76 | HR 91 | Ht 65.0 in | Wt 242.6 lb

## 2024-06-30 DIAGNOSIS — I5033 Acute on chronic diastolic (congestive) heart failure: Secondary | ICD-10-CM | POA: Diagnosis not present

## 2024-06-30 DIAGNOSIS — E1169 Type 2 diabetes mellitus with other specified complication: Secondary | ICD-10-CM

## 2024-06-30 DIAGNOSIS — I351 Nonrheumatic aortic (valve) insufficiency: Secondary | ICD-10-CM

## 2024-06-30 DIAGNOSIS — E782 Mixed hyperlipidemia: Secondary | ICD-10-CM

## 2024-06-30 DIAGNOSIS — I10 Essential (primary) hypertension: Secondary | ICD-10-CM | POA: Diagnosis not present

## 2024-06-30 DIAGNOSIS — I340 Nonrheumatic mitral (valve) insufficiency: Secondary | ICD-10-CM

## 2024-06-30 DIAGNOSIS — I420 Dilated cardiomyopathy: Secondary | ICD-10-CM

## 2024-06-30 DIAGNOSIS — N184 Chronic kidney disease, stage 4 (severe): Secondary | ICD-10-CM | POA: Diagnosis not present

## 2024-06-30 NOTE — Progress Notes (Signed)
 "     Cardiology Office Note   Date:  06/30/2024   ID:  Grace Fox, DOB 09-Sep-1970, MRN [id number]  PCP:  Fernand Fredy RAMAN, MD  Cardiologist:  Denyse Fernand, MD      History of Present Illness: Grace Fox is a 53 y.o. female who presents for  Chief Complaint  Patient presents with   Follow-up    2 month follow up     Doing well.      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   Oxygen deficit    2L/ HS   Pneumonia    PONV (postoperative nausea and vomiting)    with GA   Thyroid  disease    Vertigo      Past Surgical History:  Procedure Laterality Date   CHOLECYSTECTOMY     CHOLECYSTECTOMY N/A 02/28/2018   Procedure: LAPAROSCOPIC CHOLECYSTECTOMY WITH INTRAOPERATIVE CHOLANGIOGRAM;  Surgeon: Curvin Deward MOULD, MD;  Location: [facility] ORS;  Service: General;  Laterality: N/A;   LEEP N/A 02/16/2019   Procedure: LOOP ELECTROSURGICAL EXCISION PROCEDURE (LEEP);  Surgeon: Janit Alm Agent, MD;  Location: [facility] OR[facility];  Service: Gynecology;  Laterality: N/A;   LEFT HEART CATH AND CORONARY ANGIOGRAPHY Left 04/14/2020   Procedure: LEFT HEART CATH AND CORONARY ANGIOGRAPHY;  Surgeon: Fernand Denyse LABOR, MD;  Location: [facility];  Service: Cardiovascular;  Laterality: Left;     Current Outpatient Medications  Medication Sig Dispense Refill   ACCRUFER  30 MG CAPS TAKE ONE (1) CAPSULE BY MOUTH ONCE DAILY 90 capsule 11   Accu-Chek Softclix Lancets lancets Use as instructed 100 each 12   ALPRAZolam  (XANAX) 1 MG tablet Take 1 mg by mouth 3 (three) times daily as needed for anxiety.      buPROPion  (WELLBUTRIN  XL) 300 MG 24 hr tablet Take 300 mg by mouth daily.   2   Cetirizine HCl (ZYRTEC ALLERGY) 10 MG CAPS Take 10 mg by mouth daily.     Cholecalciferol (VITAMIN D3) 1.25 MG (50000 UT) CAPS Take 1 capsule (1.25 mg total) by mouth once a week. 12 capsule 3   dapagliflozin  propanediol (FARXIGA ) 10 MG TABS tablet Take 1 tablet by mouth once daily 30 tablet 3   divalproex (DEPAKOTE) 500 MG DR tablet Take 500 mg by mouth at bedtime. (Patient taking differently: Take 500 mg by mouth 2 (two) times daily.)     ezetimibe  (ZETIA ) 10 MG tablet TAKE 1 TABLET BY MOUTH DAILY 90 tablet 11   famotidine  (PEPCID ) 20 MG tablet Take 20 mg by mouth 2 (two) times daily.     fenofibrate  (TRICOR ) 145 MG tablet TAKE 1 TABLET BY MOUTH ONCE DAILY 90 tablet 2   folic acid  (FOLVITE ) 1 MG tablet Take 1 tablet by mouth once daily 90 tablet 0   furosemide  (LASIX ) 20 MG tablet TAKE  1 TABLET BY MOUTH DAILY 90 tablet 11   glimepiride  (AMARYL ) 2 MG tablet Take 1 tablet (2 mg total) by mouth 2 (two) times daily. 60 tablet 6   glucose blood (ACCU-CHEK GUIDE TEST) test strip Use as instructed 100 each 12   levothyroxine  (LEVOXYL ) 25 MCG tablet Take 1 tablet (25 mcg total) by mouth daily before breakfast. 30 tablet 3   lipase/protease/amylase (CREON) 12000-38000 units CPEP capsule Take 36,000 Units by mouth.     losartan  (COZAAR ) 25 MG tablet TAKE 1/2 TABLET BY MOUTH ONCE DAILY 45 tablet 11   meclizine  (ANTIVERT ) 25 MG tablet TAKE 1 TABLET BY MOUTH TWICE DAILY 60 tablet 10   metoprolol  succinate (TOPROL -XL) 25 MG 24 hr tablet Take 1 tablet by mouth once daily 90 tablet 0   OXYGEN Inhale 2 L into the lungs at bedtime.     pravastatin  (PRAVACHOL ) 40 MG tablet TAKE 1 TABLET BY MOUTH DAILY 30 tablet 10   SYMBICORT  160-4.5 MCG/ACT inhaler INHALE TWO (2) PUFFS BY MOUTH EVERY 12 HOURS 10.2 g 10   VENTOLIN  HFA 108 (90 Base) MCG/ACT  inhaler INHALE 2 PUFFS BY MOUTH EVERY 4 HOURS AS NEEDED FOR SHORTNESS OF BREATH OR WHEEZING 18 g 10   No current facility-administered medications for this visit.   Facility-Administered Medications Ordered in Other Visits  Medication Dose Route Frequency Provider Last Rate Last Admin   sodium chloride  flush (NS) 0.9 % injection 3 mL  3 mL Intravenous Q12H Fernand Alter A, MD        Allergies:   Grass pollen(k-o-r-t-swt vern), Lactose intolerance (gi), and Other    Social History:   reports that she quit smoking about 3 years ago. Her smoking use included cigarettes. She started smoking about 23 years ago. She has a 20 pack-year smoking history. She has never used smokeless tobacco. She reports that she does not drink alcohol and does not use drugs.   Family History:  family history includes Cancer in her paternal grandmother; Diabetes in her father, maternal grandmother, mother, and another family member; Emphysema in her maternal grandmother and paternal grandmother; Heart attack in her maternal grandfather; Heart failure in her brother, father, mother, and another family member; Hypertension in her maternal grandfather; Lung cancer in her father; Stroke in her maternal grandfather; Thyroid  disease in her mother.    ROS:     Review of Systems  Constitutional: Negative.   HENT: Negative.    Eyes: Negative.   Respiratory: Negative.    Gastrointestinal: Negative.   Genitourinary: Negative.   Musculoskeletal: Negative.   Skin: Negative.   Neurological: Negative.   Endo/Heme/Allergies: Negative.   Psychiatric/Behavioral: Negative.    All other systems reviewed and are negative.     All other systems are reviewed and negative.    PHYSICAL EXAM: VS:  BP 121/76   Pulse 91   Ht 5' 5 (1.651 m)   Wt 242 lb 9.6 oz (110 kg)   SpO2 98%   BMI 40.37 kg/m  , BMI Body mass index is 40.37 kg/m. Last weight:  Wt Readings from Last 3 Encounters:  06/30/24 242 lb 9.6 oz (110 kg)   05/19/24 238 lb 6.4 oz (108.1 kg)  05/12/24 234 lb 3.2 oz (106.2 kg)     Physical Exam Constitutional:      Appearance: Normal appearance.  Cardiovascular:     Rate and Rhythm: Normal rate and regular rhythm.     Heart sounds: Normal heart sounds.  Pulmonary:  Effort: Pulmonary effort is normal.     Breath sounds: Normal breath sounds.  Musculoskeletal:     Right lower leg: No edema.     Left lower leg: No edema.  Neurological:     Mental Status: She is alert.       EKG:   Recent Labs: 04/07/2024: TSH 4.450 05/19/2024: ALT 19; BUN 39; Creatinine 2.67; Hemoglobin 10.8; Platelet Count 309; Potassium 3.8; Sodium 136    Lipid Panel    Component Value Date/Time   CHOL 184 04/01/2024 0809   TRIG 332 (H) 04/01/2024 0809   HDL 42 04/01/2024 0809   LDLCALC 87 04/01/2024 0809      Other studies Reviewed: Additional studies/ records that were reviewed today include:  Review of the above records demonstrates:       No data to display            ASSESSMENT AND PLAN:    ICD-10-CM   1. Nonrheumatic mitral valve regurgitation  I34.0     2. Nonrheumatic aortic valve insufficiency  I35.1     3. Dilated cardiomyopathy (HCC)  I42.0     4. Mixed hyperlipidemia  E78.2     5. CHF (congestive heart failure), NYHA class III, acute on chronic, diastolic (HCC)  I50.33    ON GDMT doing well. Off aldactone , taking lasix  once or two times a day    6. Essential hypertension, benign  I10     7. CKD (chronic kidney disease) stage 4, GFR 15-29 ml/min (HCC)  N18.4    waiting for new labs as last one creat 1.62    8. Combined hyperlipidemia associated with type 2 diabetes mellitus (HCC)  E11.69    E78.2        Problem List Items Addressed This Visit       Cardiovascular and Mediastinum   CHF (congestive heart failure), NYHA class III, acute on chronic, diastolic (HCC)   Dilated cardiomyopathy (HCC)   Nonrheumatic mitral valve regurgitation - Primary   Nonrheumatic  aortic valve insufficiency   Essential hypertension, benign     Genitourinary   CKD (chronic kidney disease) stage 4, GFR 15-29 ml/min (HCC)     Other   Mixed hyperlipidemia   Other Visit Diagnoses       Combined hyperlipidemia associated with type 2 diabetes mellitus (HCC)              Disposition:   Return in about 3 months (around 09/28/2024).    Total time spent: 35 minutes  Signed,  Denyse Bathe, MD  06/30/2024 11:02 AM    [facility] "

## 2024-07-13 ENCOUNTER — Ambulatory Visit: Admitting: Internal Medicine

## 2024-07-20 ENCOUNTER — Ambulatory Visit: Admitting: Internal Medicine

## 2024-07-22 ENCOUNTER — Other Ambulatory Visit: Payer: Self-pay | Admitting: Cardiovascular Disease

## 2024-07-24 ENCOUNTER — Other Ambulatory Visit: Payer: Self-pay | Admitting: Internal Medicine

## 2024-07-24 DIAGNOSIS — E1165 Type 2 diabetes mellitus with hyperglycemia: Secondary | ICD-10-CM

## 2024-07-24 MED ORDER — GLIMEPIRIDE 2 MG PO TABS: 2.0000 mg | ORAL_TABLET | Freq: Two times a day (BID) | ORAL | 6 refills | Status: AC

## 2024-07-29 ENCOUNTER — Other Ambulatory Visit: Payer: Self-pay | Admitting: Cardiovascular Disease

## 2024-07-30 ENCOUNTER — Other Ambulatory Visit: Payer: Self-pay | Admitting: Cardiovascular Disease

## 2024-08-06 ENCOUNTER — Other Ambulatory Visit: Payer: Self-pay | Admitting: Internal Medicine

## 2024-08-06 DIAGNOSIS — E559 Vitamin D deficiency, unspecified: Secondary | ICD-10-CM

## 2024-08-19 ENCOUNTER — Inpatient Hospital Stay

## 2024-09-22 ENCOUNTER — Ambulatory Visit: Admitting: Cardiovascular Disease

## 2024-11-16 ENCOUNTER — Inpatient Hospital Stay: Admitting: Oncology

## 2024-11-16 ENCOUNTER — Inpatient Hospital Stay
